# Patient Record
Sex: Female | Born: 1960 | Race: White | Hispanic: No | Smoking: Former smoker
Health system: Southern US, Community
[De-identification: ages and names within clinical notes are randomized; demographics above are authoritative.]

## PROBLEM LIST (undated history)

## (undated) DIAGNOSIS — R81 Glycosuria: Secondary | ICD-10-CM

## (undated) DIAGNOSIS — IMO0002 Reserved for concepts with insufficient information to code with codable children: Secondary | ICD-10-CM

## (undated) DIAGNOSIS — R569 Unspecified convulsions: Secondary | ICD-10-CM

## (undated) DIAGNOSIS — H269 Unspecified cataract: Secondary | ICD-10-CM

## (undated) DIAGNOSIS — D329 Benign neoplasm of meninges, unspecified: Secondary | ICD-10-CM

## (undated) DIAGNOSIS — G43909 Migraine, unspecified, not intractable, without status migrainosus: Secondary | ICD-10-CM

## (undated) DIAGNOSIS — K635 Polyp of colon: Secondary | ICD-10-CM

## (undated) DIAGNOSIS — I341 Nonrheumatic mitral (valve) prolapse: Secondary | ICD-10-CM

## (undated) DIAGNOSIS — K222 Esophageal obstruction: Secondary | ICD-10-CM

## (undated) DIAGNOSIS — B1920 Unspecified viral hepatitis C without hepatic coma: Secondary | ICD-10-CM

## (undated) DIAGNOSIS — E785 Hyperlipidemia, unspecified: Secondary | ICD-10-CM

## (undated) DIAGNOSIS — K219 Gastro-esophageal reflux disease without esophagitis: Secondary | ICD-10-CM

## (undated) DIAGNOSIS — K759 Inflammatory liver disease, unspecified: Secondary | ICD-10-CM

## (undated) DIAGNOSIS — T7840XA Allergy, unspecified, initial encounter: Secondary | ICD-10-CM

## (undated) HISTORY — DX: Gastro-esophageal reflux disease without esophagitis: K21.9

## (undated) HISTORY — DX: Inflammatory liver disease, unspecified: K75.9

## (undated) HISTORY — DX: Nonrheumatic mitral (valve) prolapse: I34.1

## (undated) HISTORY — DX: Unspecified viral hepatitis C without hepatic coma: B19.20

## (undated) HISTORY — DX: Unspecified convulsions: R56.9

## (undated) HISTORY — DX: Esophageal obstruction: K22.2

## (undated) HISTORY — PX: CRANIOTOMY: SHX93

## (undated) HISTORY — PX: BUNIONECTOMY: SHX129

## (undated) HISTORY — PX: APPENDECTOMY: SHX54

## (undated) HISTORY — DX: Allergy, unspecified, initial encounter: T78.40XA

## (undated) HISTORY — DX: Hyperlipidemia, unspecified: E78.5

## (undated) HISTORY — DX: Migraine, unspecified, not intractable, without status migrainosus: G43.909

## (undated) HISTORY — DX: Unspecified cataract: H26.9

## (undated) HISTORY — DX: Benign neoplasm of meninges, unspecified: D32.9

## (undated) HISTORY — PX: WISDOM TOOTH EXTRACTION: SHX21

## (undated) HISTORY — PX: TUBAL LIGATION: SHX77

## (undated) HISTORY — DX: Polyp of colon: K63.5

## (undated) HISTORY — DX: Reserved for concepts with insufficient information to code with codable children: IMO0002

## (undated) HISTORY — DX: Glycosuria: R81

---

## 1980-05-22 HISTORY — PX: CYSTECTOMY: SUR359

## 1996-05-22 DIAGNOSIS — D496 Neoplasm of unspecified behavior of brain: Secondary | ICD-10-CM | POA: Insufficient documentation

## 1996-05-22 HISTORY — PX: OTHER SURGICAL HISTORY: SHX169

## 1998-04-23 ENCOUNTER — Ambulatory Visit (HOSPITAL_COMMUNITY): Admission: RE | Admit: 1998-04-23 | Discharge: 1998-04-23 | Payer: Self-pay | Admitting: Neurosurgery

## 1998-04-23 ENCOUNTER — Encounter: Payer: Self-pay | Admitting: Neurosurgery

## 1998-05-22 HISTORY — PX: OTHER SURGICAL HISTORY: SHX169

## 1998-06-21 ENCOUNTER — Other Ambulatory Visit: Admission: RE | Admit: 1998-06-21 | Discharge: 1998-06-21 | Payer: Self-pay | Admitting: Obstetrics and Gynecology

## 1998-06-25 ENCOUNTER — Other Ambulatory Visit: Admission: RE | Admit: 1998-06-25 | Discharge: 1998-06-25 | Payer: Self-pay | Admitting: Obstetrics and Gynecology

## 1998-10-21 ENCOUNTER — Other Ambulatory Visit: Admission: RE | Admit: 1998-10-21 | Discharge: 1998-10-21 | Payer: Self-pay | Admitting: Obstetrics and Gynecology

## 1999-03-17 ENCOUNTER — Encounter: Payer: Self-pay | Admitting: Obstetrics and Gynecology

## 1999-03-18 ENCOUNTER — Encounter (INDEPENDENT_AMBULATORY_CARE_PROVIDER_SITE_OTHER): Payer: Self-pay | Admitting: Specialist

## 1999-03-18 ENCOUNTER — Ambulatory Visit (HOSPITAL_COMMUNITY): Admission: RE | Admit: 1999-03-18 | Discharge: 1999-03-18 | Payer: Self-pay | Admitting: Obstetrics and Gynecology

## 1999-04-29 ENCOUNTER — Other Ambulatory Visit: Admission: RE | Admit: 1999-04-29 | Discharge: 1999-04-29 | Payer: Self-pay | Admitting: Obstetrics and Gynecology

## 1999-07-29 ENCOUNTER — Other Ambulatory Visit: Admission: RE | Admit: 1999-07-29 | Discharge: 1999-07-29 | Payer: Self-pay | Admitting: Obstetrics and Gynecology

## 1999-09-01 ENCOUNTER — Emergency Department (HOSPITAL_COMMUNITY): Admission: EM | Admit: 1999-09-01 | Discharge: 1999-09-02 | Payer: Self-pay | Admitting: Emergency Medicine

## 1999-10-27 ENCOUNTER — Other Ambulatory Visit: Admission: RE | Admit: 1999-10-27 | Discharge: 1999-10-27 | Payer: Self-pay | Admitting: Obstetrics and Gynecology

## 2000-02-09 ENCOUNTER — Other Ambulatory Visit: Admission: RE | Admit: 2000-02-09 | Discharge: 2000-02-09 | Payer: Self-pay | Admitting: Obstetrics and Gynecology

## 2000-04-08 ENCOUNTER — Encounter: Payer: Self-pay | Admitting: Neurosurgery

## 2000-04-08 ENCOUNTER — Ambulatory Visit (HOSPITAL_COMMUNITY): Admission: RE | Admit: 2000-04-08 | Discharge: 2000-04-08 | Payer: Self-pay | Admitting: Neurosurgery

## 2000-05-11 ENCOUNTER — Other Ambulatory Visit: Admission: RE | Admit: 2000-05-11 | Discharge: 2000-05-11 | Payer: Self-pay | Admitting: Obstetrics and Gynecology

## 2000-11-21 ENCOUNTER — Other Ambulatory Visit: Admission: RE | Admit: 2000-11-21 | Discharge: 2000-11-21 | Payer: Self-pay | Admitting: Obstetrics and Gynecology

## 2001-06-21 ENCOUNTER — Other Ambulatory Visit: Admission: RE | Admit: 2001-06-21 | Discharge: 2001-06-21 | Payer: Self-pay | Admitting: Obstetrics and Gynecology

## 2002-01-27 ENCOUNTER — Encounter (INDEPENDENT_AMBULATORY_CARE_PROVIDER_SITE_OTHER): Payer: Self-pay | Admitting: *Deleted

## 2003-02-05 ENCOUNTER — Emergency Department (HOSPITAL_COMMUNITY): Admission: EM | Admit: 2003-02-05 | Discharge: 2003-02-05 | Payer: Self-pay | Admitting: Emergency Medicine

## 2003-02-05 ENCOUNTER — Encounter: Payer: Self-pay | Admitting: Emergency Medicine

## 2003-05-23 HISTORY — PX: OTHER SURGICAL HISTORY: SHX169

## 2003-08-05 ENCOUNTER — Ambulatory Visit (HOSPITAL_BASED_OUTPATIENT_CLINIC_OR_DEPARTMENT_OTHER): Admission: RE | Admit: 2003-08-05 | Discharge: 2003-08-05 | Payer: Self-pay | Admitting: Orthopedic Surgery

## 2004-12-01 ENCOUNTER — Ambulatory Visit: Payer: Self-pay | Admitting: Gastroenterology

## 2004-12-15 ENCOUNTER — Ambulatory Visit: Payer: Self-pay | Admitting: Gastroenterology

## 2004-12-15 ENCOUNTER — Encounter: Payer: Self-pay | Admitting: Internal Medicine

## 2004-12-15 ENCOUNTER — Encounter (INDEPENDENT_AMBULATORY_CARE_PROVIDER_SITE_OTHER): Payer: Self-pay | Admitting: Specialist

## 2005-03-06 ENCOUNTER — Ambulatory Visit: Payer: Self-pay | Admitting: Internal Medicine

## 2006-01-25 ENCOUNTER — Encounter: Admission: RE | Admit: 2006-01-25 | Discharge: 2006-01-25 | Payer: Self-pay | Admitting: Obstetrics and Gynecology

## 2006-09-18 ENCOUNTER — Ambulatory Visit: Payer: Self-pay | Admitting: Internal Medicine

## 2006-09-18 LAB — CONVERTED CEMR LAB
ALT: 17 units/L (ref 0–40)
Albumin: 3.6 g/dL (ref 3.5–5.2)
Alkaline Phosphatase: 81 units/L (ref 39–117)
BUN: 8 mg/dL (ref 6–23)
Basophils Absolute: 0.1 10*3/uL (ref 0.0–0.1)
Basophils Relative: 0.8 % (ref 0.0–1.0)
CO2: 31 meq/L (ref 19–32)
Calcium: 9 mg/dL (ref 8.4–10.5)
Chloride: 105 meq/L (ref 96–112)
Cholesterol: 222 mg/dL (ref 0–200)
Creatinine, Ser: 0.7 mg/dL (ref 0.4–1.2)
Direct LDL: 148.7 mg/dL
Free T4: 0.6 ng/dL (ref 0.6–1.6)
HDL: 51.9 mg/dL (ref >39.0)
Hemoglobin: 13.3 g/dL (ref 12.0–15.0)
MCHC: 34.5 g/dL (ref 30.0–36.0)
Monocytes Relative: 6.2 % (ref 3.0–11.0)
Platelets: 302 10*3/uL (ref 150–400)
RBC: 4.43 M/uL (ref 3.87–5.11)
RDW: 12.8 % (ref 11.5–14.6)
Total Bilirubin: 0.6 mg/dL (ref 0.3–1.2)
Total CHOL/HDL Ratio: 4.3
Total Protein: 6.9 g/dL (ref 6.0–8.3)
Triglycerides: 125 mg/dL (ref 0–149)
VLDL: 25 mg/dL (ref 0–40)

## 2006-12-05 ENCOUNTER — Ambulatory Visit: Payer: Self-pay | Admitting: Internal Medicine

## 2006-12-05 LAB — CONVERTED CEMR LAB: Phenobarbital: 13.7 ug/mL — ABNORMAL LOW (ref 15.0–40.0)

## 2006-12-10 ENCOUNTER — Telehealth: Payer: Self-pay | Admitting: Internal Medicine

## 2006-12-12 ENCOUNTER — Telehealth: Payer: Self-pay | Admitting: Internal Medicine

## 2006-12-14 ENCOUNTER — Ambulatory Visit: Payer: Self-pay | Admitting: Cardiology

## 2006-12-19 ENCOUNTER — Encounter: Payer: Self-pay | Admitting: Internal Medicine

## 2006-12-19 ENCOUNTER — Ambulatory Visit: Payer: Self-pay

## 2007-01-28 ENCOUNTER — Encounter: Admission: RE | Admit: 2007-01-28 | Discharge: 2007-01-28 | Payer: Self-pay | Admitting: Obstetrics and Gynecology

## 2007-02-27 DIAGNOSIS — Z86010 Personal history of colonic polyps: Secondary | ICD-10-CM | POA: Insufficient documentation

## 2007-10-17 ENCOUNTER — Telehealth: Payer: Self-pay | Admitting: *Deleted

## 2007-12-24 ENCOUNTER — Ambulatory Visit: Payer: Self-pay | Admitting: Internal Medicine

## 2007-12-24 DIAGNOSIS — R81 Glycosuria: Secondary | ICD-10-CM | POA: Insufficient documentation

## 2007-12-24 DIAGNOSIS — Z6841 Body Mass Index (BMI) 40.0 and over, adult: Secondary | ICD-10-CM | POA: Insufficient documentation

## 2007-12-24 DIAGNOSIS — E559 Vitamin D deficiency, unspecified: Secondary | ICD-10-CM | POA: Insufficient documentation

## 2007-12-25 LAB — CONVERTED CEMR LAB
Alkaline Phosphatase: 87 units/L (ref 39–117)
Basophils Absolute: 0.1 10*3/uL (ref 0.0–0.1)
Bilirubin, Direct: 0.1 mg/dL (ref 0.0–0.3)
Calcium: 8.6 mg/dL (ref 8.4–10.5)
Cholesterol: 225 mg/dL (ref 0–200)
Direct LDL: 156.8 mg/dL
Free T4: 0.8 ng/dL (ref 0.6–1.6)
GFR calc Af Amer: 98 mL/min
GFR calc non Af Amer: 81 mL/min
HCT: 39.8 % (ref 36.0–46.0)
Hemoglobin: 13.6 g/dL (ref 12.0–15.0)
MCHC: 34.1 g/dL (ref 30.0–36.0)
Monocytes Absolute: 0.5 10*3/uL (ref 0.1–1.0)
Monocytes Relative: 7.2 % (ref 3.0–12.0)
Neutro Abs: 4.9 10*3/uL (ref 1.4–7.7)
Platelets: 327 10*3/uL (ref 150–400)
Potassium: 4.2 meq/L (ref 3.5–5.1)
RDW: 12.6 % (ref 11.5–14.6)
Sodium: 138 meq/L (ref 135–145)
TSH: 1.87 microintl units/mL (ref 0.35–5.50)
Total Bilirubin: 0.7 mg/dL (ref 0.3–1.2)
Total CHOL/HDL Ratio: 4.8
Triglycerides: 120 mg/dL (ref 0–149)
VLDL: 24 mg/dL (ref 0–40)

## 2007-12-27 LAB — CONVERTED CEMR LAB: Vit D, 1,25-Dihydroxy: 15 — ABNORMAL LOW (ref 30–89)

## 2008-01-29 ENCOUNTER — Encounter: Admission: RE | Admit: 2008-01-29 | Discharge: 2008-01-29 | Payer: Self-pay | Admitting: Obstetrics and Gynecology

## 2008-05-19 ENCOUNTER — Encounter: Payer: Self-pay | Admitting: Internal Medicine

## 2009-02-01 ENCOUNTER — Ambulatory Visit: Payer: Self-pay | Admitting: Internal Medicine

## 2009-02-01 DIAGNOSIS — J019 Acute sinusitis, unspecified: Secondary | ICD-10-CM | POA: Insufficient documentation

## 2009-02-02 ENCOUNTER — Telehealth (INDEPENDENT_AMBULATORY_CARE_PROVIDER_SITE_OTHER): Payer: Self-pay | Admitting: *Deleted

## 2009-02-03 ENCOUNTER — Telehealth: Payer: Self-pay | Admitting: Internal Medicine

## 2009-02-04 ENCOUNTER — Ambulatory Visit: Payer: Self-pay | Admitting: Family Medicine

## 2009-02-10 ENCOUNTER — Encounter: Admission: RE | Admit: 2009-02-10 | Discharge: 2009-02-10 | Payer: Self-pay | Admitting: Obstetrics and Gynecology

## 2009-03-01 ENCOUNTER — Telehealth: Payer: Self-pay | Admitting: *Deleted

## 2009-03-02 ENCOUNTER — Ambulatory Visit: Payer: Self-pay | Admitting: Internal Medicine

## 2009-03-02 DIAGNOSIS — R5381 Other malaise: Secondary | ICD-10-CM | POA: Insufficient documentation

## 2009-03-02 DIAGNOSIS — R059 Cough, unspecified: Secondary | ICD-10-CM | POA: Insufficient documentation

## 2009-03-02 DIAGNOSIS — R05 Cough: Secondary | ICD-10-CM

## 2009-03-02 DIAGNOSIS — R5383 Other fatigue: Secondary | ICD-10-CM

## 2009-03-02 DIAGNOSIS — R079 Chest pain, unspecified: Secondary | ICD-10-CM | POA: Insufficient documentation

## 2009-03-02 LAB — CONVERTED CEMR LAB
Basophils Relative: 0 % (ref 0.0–3.0)
Bilirubin, Direct: 0 mg/dL (ref 0.0–0.3)
CMV IgM: <8 (ref <30.0)
CO2: 29 meq/L (ref 19–32)
Calcium: 9.2 mg/dL (ref 8.4–10.5)
Creatinine, Ser: 0.8 mg/dL (ref 0.4–1.2)
Eosinophils Absolute: 0.3 10*3/uL (ref 0.0–0.7)
GFR calc non Af Amer: 80.48 mL/min (ref >60)
HCT: 40.8 % (ref 36.0–46.0)
HCV Ab: REACTIVE — AB
Hemoglobin: 13.8 g/dL (ref 12.0–15.0)
Hepatitis B Surface Ag: NEGATIVE
Heterophile Ab Screen: NEGATIVE
Lymphocytes Relative: 19.5 % (ref 12.0–46.0)
Lymphs Abs: 2 10*3/uL (ref 0.7–4.0)
MCHC: 33.9 g/dL (ref 30.0–36.0)
Neutro Abs: 8 10*3/uL — ABNORMAL HIGH (ref 1.4–7.7)
RBC: 4.6 M/uL (ref 3.87–5.11)
Sodium: 140 meq/L (ref 135–145)
TSH: 1.47 microintl units/mL (ref 0.35–5.50)
Total Bilirubin: 0.6 mg/dL (ref 0.3–1.2)
Total Protein: 7.3 g/dL (ref 6.0–8.3)

## 2009-03-04 ENCOUNTER — Encounter: Payer: Self-pay | Admitting: Internal Medicine

## 2009-03-05 ENCOUNTER — Telehealth: Payer: Self-pay | Admitting: Internal Medicine

## 2009-03-09 ENCOUNTER — Telehealth: Payer: Self-pay | Admitting: Internal Medicine

## 2009-03-11 ENCOUNTER — Ambulatory Visit: Payer: Self-pay | Admitting: Internal Medicine

## 2009-03-19 ENCOUNTER — Telehealth: Payer: Self-pay | Admitting: *Deleted

## 2009-05-04 ENCOUNTER — Encounter: Payer: Self-pay | Admitting: Internal Medicine

## 2009-05-22 HISTORY — PX: COLONOSCOPY W/ BIOPSIES: SHX1374

## 2009-11-08 ENCOUNTER — Encounter (INDEPENDENT_AMBULATORY_CARE_PROVIDER_SITE_OTHER): Payer: Self-pay | Admitting: *Deleted

## 2010-01-25 ENCOUNTER — Encounter (INDEPENDENT_AMBULATORY_CARE_PROVIDER_SITE_OTHER): Payer: Self-pay | Admitting: *Deleted

## 2010-01-26 ENCOUNTER — Ambulatory Visit: Payer: Self-pay | Admitting: Gastroenterology

## 2010-02-04 ENCOUNTER — Ambulatory Visit: Payer: Self-pay | Admitting: Gastroenterology

## 2010-02-04 LAB — HM COLONOSCOPY

## 2010-02-08 ENCOUNTER — Encounter: Payer: Self-pay | Admitting: Gastroenterology

## 2010-02-14 ENCOUNTER — Encounter: Admission: RE | Admit: 2010-02-14 | Discharge: 2010-02-14 | Payer: Self-pay | Admitting: Obstetrics and Gynecology

## 2010-05-17 ENCOUNTER — Encounter: Payer: Self-pay | Admitting: Internal Medicine

## 2010-05-22 DIAGNOSIS — K222 Esophageal obstruction: Secondary | ICD-10-CM

## 2010-05-22 DIAGNOSIS — IMO0002 Reserved for concepts with insufficient information to code with codable children: Secondary | ICD-10-CM

## 2010-05-22 HISTORY — PX: ESOPHAGOGASTRODUODENOSCOPY: SHX1529

## 2010-05-22 HISTORY — DX: Esophageal obstruction: K22.2

## 2010-05-22 HISTORY — DX: Reserved for concepts with insufficient information to code with codable children: IMO0002

## 2010-06-12 ENCOUNTER — Encounter: Payer: Self-pay | Admitting: Obstetrics and Gynecology

## 2010-06-21 NOTE — Letter (Signed)
Summary: [facility]  [facility]   Imported By: Maryln Gottron 05/25/2009 14:45:31  _____________________________________________________________________  External Attachment:    Type:   Image     Comment:   External Document

## 2010-06-21 NOTE — Letter (Signed)
Summary: Patient Notice- Polyp Results  [facility]  [address] [place], [place] [address]   Phone: [phone]  Fax: [phone]        February 08, 2010 MRN: [id number]    Tricia [name] [address] [place], [place]  [address]    Dear [name],  I am pleased to inform you that the colon polyp(s) removed during your recent colonoscopy was (were) found to be benign (no cancer detected) upon pathologic examination.  I recommend you have a repeat colonoscopy examination in 5_ years to look for recurrent polyps, as having colon polyps increases your risk for having recurrent polyps or even colon cancer in the future.  Should you develop new or worsening symptoms of abdominal pain, bowel habit changes or bleeding from the rectum or bowels, please schedule an evaluation with either your primary care physician or with me.  Additional information/recommendations:  __ No further action with gastroenterology is needed at this time. Please      follow-up with your primary care physician for your other healthcare      needs.  __ Please call [phone] to schedule a return visit to review your      situation.  __ Please keep your follow-up visit as already scheduled.  _x_ Continue treatment plan as outlined the day of your exam.  Please call us if you are having persistent problems or have questions about your condition that have not been fully answered at this time.  Sincerely,  Louis Meckel MD  This letter has been electronically signed by your physician.  Appended Document: Patient Notice- Polyp Results letter mailed.

## 2010-06-21 NOTE — Letter (Signed)
Summary: [facility] Instructions  [facility]  [address] [place], [place] [address]   Phone: [phone]  Fax: [phone]       Tricia [name]    [birth year]    MRN: [id number]        Procedure Day Dorna Bloom:  Farrell Ours  02/04/10     Arrival Time: 8:00am     Procedure Time: 9:00am     Location of Procedure:                    [facility] (4th Floor)                        PREPARATION FOR COLONOSCOPY WITH MOVIPREP   Starting 5 days prior to your procedure  SUNDAY 09/11  do not eat nuts, seeds, popcorn, corn, beans, peas,  salads, or any raw vegetables.  Do not take any fiber supplements (e.g. Metamucil, Citrucel, and Benefiber).  THE DAY BEFORE YOUR PROCEDURE         DATE:  THURSDAY  09/15  1.  Drink clear liquids the entire day-NO SOLID FOOD  2.  Do not drink anything colored red or purple.  Avoid juices with pulp.  No orange juice.  3.  Drink at least 64 oz. (8 glasses) of fluid/clear liquids during the day to prevent dehydration and help the prep work efficiently.  CLEAR LIQUIDS INCLUDE: Water Jello Ice Popsicles Tea (sugar ok, no milk/cream) Powdered fruit flavored drinks Coffee (sugar ok, no milk/cream) Gatorade Juice: apple, white grape, white cranberry  Lemonade Clear bullion, consomm, broth Carbonated beverages (any kind) Strained chicken noodle soup Hard Candy                             4.  In the morning, mix first dose of MoviPrep solution:    Empty 1 Pouch A and 1 Pouch B into the disposable container    Add lukewarm drinking water to the top line of the container. Mix to dissolve    Refrigerate (mixed solution should be used within 24 hrs)  5.  Begin drinking the prep at 5:00 p.m. The MoviPrep container is divided by 4 marks.   Every 15 minutes drink the solution down to the next mark (approximately 8 oz) until the full liter is complete.   6.  Follow completed prep with 16 oz of clear liquid of your choice  (Nothing red or purple).  Continue to drink clear liquids until bedtime.  7.  Before going to bed, mix second dose of MoviPrep solution:    Empty 1 Pouch A and 1 Pouch B into the disposable container    Add lukewarm drinking water to the top line of the container. Mix to dissolve    Refrigerate  THE DAY OF YOUR PROCEDURE      DATE: FRIDAY  09/16  Beginning at  4:00 a.m. (5 hours before procedure):         1. Every 15 minutes, drink the solution down to the next mark (approx 8 oz) until the full liter is complete.  2. Follow completed prep with 16 oz. of clear liquid of your choice.    3. You may drink clear liquids until 7:00am  (2 HOURS BEFORE PROCEDURE).   MEDICATION INSTRUCTIONS  Unless otherwise instructed, you should take regular prescription medications with a small sip of water   as early as  possible the morning of your procedure.        OTHER INSTRUCTIONS  You will need a responsible adult at least 52 years of age to accompany you and drive you home.   This person must remain in the waiting room during your procedure.  Wear loose fitting clothing that is easily removed.  Leave jewelry and other valuables at home.  However, you may wish to bring a book to read or  an iPod/MP3 player to listen to music as you wait for your procedure to start.  Remove all body piercing jewelry and leave at home.  Total time from sign-in until discharge is approximately 2-3 hours.  You should go home directly after your procedure and rest.  You can resume normal activities the  day after your procedure.  The day of your procedure you should not:   Drive   Make legal decisions   Operate machinery   Drink alcohol   Return to work  You will receive specific instructions about eating, activities and medications before you leave.    The above instructions have been reviewed and explained to me by   Ezra Sites RN  January 26, 2010 4:49 PM     I fully understand and  can verbalize these instructions _____________________________ Date _________

## 2010-06-21 NOTE — Miscellaneous (Signed)
Summary: [facility]  Clinical Lists Changes  Medications: Added new medication of MOVIPREP 100 GM  SOLR (PEG-KCL-NACL-NASULF-NA ASC-C) As per prep instructions. - Signed Rx of MOVIPREP 100 GM  SOLR (PEG-KCL-NACL-NASULF-NA ASC-C) As per prep instructions.;  #1 x 0;  Signed;  Entered by: Ezra Sites RN;  Authorized by: Louis Meckel MD;  Method used: [facility] [phone]*, [address], [place], [place]  [address], Ph: [phone] or [phone], Fax: [phone] Observations: Added new observation of ALLERGY REV: Done (01/26/2010 16:24)    Prescriptions: MOVIPREP 100 GM  SOLR (PEG-KCL-NACL-NASULF-NA ASC-C) As per prep instructions.  #1 x 0   Entered by:   Ezra Sites RN   Authorized by:   Louis Meckel MD   Signed by:   Ezra Sites RN on 01/26/2010   Method used:   [facility]ronically to        [facility] [phone]* (retail)       [address]       [place], [place]  [address]       Ph: [phone] or [phone]       Fax: [phone]   RxID:   [id number]

## 2010-06-21 NOTE — Procedures (Signed)
Summary: Colonoscopy  Patient: Tricia [name] Note: All result statuses are Final unless otherwise noted.  Tests: (1) Colonoscopy (COL)   COL Colonoscopy           DONE     [facility]     [address]     [place], [place]  [address]           COLONOSCOPY PROCEDURE REPORT           PATIENT:  Tricia [name], Tricia [name]  MR#:  [id number]     BIRTHDATE:  [birth year], 49 yrs. old  GENDER:  female           ENDOSCOPIST:  Barbette Hair. Arlyce Dice, MD     Referred by:           PROCEDURE DATE:  02/04/2010     PROCEDURE:  Colonoscopy with snare polypectomy, Colon with cold     biopsy polypectomy     ASA CLASS:  Class II     INDICATIONS:  1) screening  2) family history of colon cancer  3)     history of pre-cancerous (adenomatous) colon polyps Father, uncles                 MEDICATIONS:   Fentanyl 75 mcg IV, Versed 8 mg IV           DESCRIPTION OF PROCEDURE:   After the risks benefits and     alternatives of the procedure were thoroughly explained, informed     consent was obtained.  Digital rectal exam was performed and     revealed no abnormalities.   The LB CF-[id number] endoscope     was introduced through the anus and advanced to the cecum, which     was identified by both the appendix and ileocecal valve, without     limitations.  The quality of the prep was excellent, using     MoviPrep.  The instrument was then slowly withdrawn as the colon     was fully examined.     <<PROCEDUREIMAGES>>           FINDINGS:  A diminutive polyp was found in the cecum. It was 1 mm     in size. The polyp was removed using cold biopsy forceps (see     image1).  A sessile polyp was found in the ascending colon. It was     2 mm in size. Polyp was snared without cautery. Retrieval was     successful (see image4). snare polyp  This was otherwise a normal     examination of the colon (see image2, image3, image6, image9,     image10, image11, image13, image16, and image17).   Retroflexed     views in  the rectum revealed no abnormalities.    The time to     cecum =  2.75  minutes. The scope was then withdrawn (time =  8.25     min) from the patient and the procedure completed.           COMPLICATIONS:  None           ENDOSCOPIC IMPRESSION:     1) 1 mm diminutive polyp in the cecum     2) 2 mm sessile polyp in the ascending colon     3) Otherwise normal examination     RECOMMENDATIONS:     1) Given your significant family history of colon cancer, you     should have a repeat colonoscopy  in 5 years           REPEAT EXAM:  In 5 year(s) for Colonoscopy.           ______________________________     Barbette Hair. Arlyce Dice, MD           CC: Madelin Headings, MD           n.     Rosalie DoctorBarbette Hair. Kaplan at 02/04/2010 09:58 AM           Page 2 of 3   [place], [place] [employer], [phone]  Note: An exclamation mark (!) indicates a result that was not dispersed into the flowsheet. Document Creation Date: 02/04/2010 9:58 AM _______________________________________________________________________  (1) Order result status: Final Collection or observation date-time: 02/04/2010 09:52 Requested date-time:  Receipt date-time:  Reported date-time:  Referring Physician:   Ordering Physician: Melvia Heaps [phone]) Specimen Source:  Source: Launa Grill Order Number: [id number] Lab site:   Appended Document: Colonoscopy     Procedures Next Due Date:    Colonoscopy: 02/2015

## 2010-06-21 NOTE — Letter (Signed)
Summary: Previsit letter  [facility]  [address] [place], [place] [address]   Phone: [phone]  Fax: [phone]       11/08/2009 MRN: [id number]  [facility] [address] [place], [place]  [address]  Dear Ms. Cruey,  Welcome to the Gastroenterology Division at [facility].    You are scheduled to see a nurse for your pre-procedure visit on 01-26-10 at 4:30p.m. on the 3rd floor at [facility], [address]  We ask that you try to arrive at our office 15 minutes prior to your appointment time to allow for check-in.  Your nurse visit will consist of discussing your medical and surgical history, your immediate family medical history, and your medications.    Please bring a complete list of all your medications or, if you prefer, bring the medication bottles and we will list them.  We will need to be aware of both prescribed and over the counter drugs.  We will need to know exact dosage information as well.  If you are on blood thinners (Coumadin, Plavix, Aggrenox, Ticlid, etc.) please call our office today/prior to your appointment, as we need to consult with your physician about holding your medication.   Please be prepared to read and sign documents such as consent forms, a financial agreement, and acknowledgement forms.  If necessary, and with your consent, a friend or relative is welcome to sit-in on the nurse visit with you.  Please bring your insurance card so that we may make a copy of it.  If your insurance requires a referral to see a specialist, please bring your referral form from your primary care physician.  No co-pay is required for this nurse visit.     If you cannot keep your appointment, please call [phone] to cancel or reschedule prior to your appointment date.  This allows [place] the opportunity to schedule an appointment for another patient in need of care.    Thank you for choosing [facility] for your medical  needs.  We appreciate the opportunity to care for you.  Please visit [place] at our website  to learn more about our practice.                     Sincerely.                                                                                                                   The Gastroenterology Division

## 2010-06-23 NOTE — Letter (Signed)
Summary: [facility]  [facility]   Imported By: Maryln Gottron 06/01/2010 14:24:43  _____________________________________________________________________  External Attachment:    Type:   Image     Comment:   External Document

## 2010-07-11 LAB — LIPID PANEL
Cholesterol: 243 mg/dL — AB (ref 0–200)
HDL: 54 mg/dL (ref 35–70)

## 2010-07-14 ENCOUNTER — Ambulatory Visit: Payer: Self-pay | Admitting: Internal Medicine

## 2010-09-21 ENCOUNTER — Encounter: Payer: Self-pay | Admitting: Internal Medicine

## 2010-10-07 NOTE — Op Note (Signed)
NAME:  Tricia [name], Tricia [name]                        ACCOUNT NO.:  [id number]   MEDICAL RECORD NO.:  [id number]                   PATIENT TYPE:  AMB   LOCATION:  [facility]                                  FACILITY:  [facility]   PHYSICIAN:  Feliberto Gottron. Turner Daniels, M.D.                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   OPERATION PERFORMED:  Left shoulder arthroscopic anterior inferior  acromioplasty, debridement of labral tear superior complex nonrepairable.  The rim of the labrum was actually intact.   SURGEON:  Feliberto Gottron. Turner Daniels, M.D.   ASSISTANT:  None.   ANESTHESIA:  Interscalene block plus general LMA.   ESTIMATED BLOOD LOSS:  Minimal.   FLUIDS REPLACED:  800 mL of crystalloid.   DRAINS:  None.   TOURNIQUET TIME:  None.   INDICATIONS FOR PROCEDURE:  The patient is a 50 year old woman who sustained  a shoulder dislocation a few months ago, has failed conservative treatment,  has positive impingement signs as well as MRI scan showing superior labral  tear that appears to be relatively complex.  I believe she got good  temporary relief from a cortisone injection about 85%, desires elective  arthroscopic evaluation and treatment of her left shoulder.   DESCRIPTION OF PROCEDURE:  The patient was identified by arm band and taken  to the operating room at [facility] where appropriate  anesthetic monitors are attached and left interscalene block anesthesia  induced with the patient in the supine position.  This was followed by  general LMA anesthesia.  The patient was placed in the beach chair position.  The left upper extremity was prepped and draped in the usual sterile fashion  from the wrist to the hemithorax.  The skin  along the anterolateral and  posterior aspects of the acromion process was infiltrated with 2 to 3 mL of  0.25% Marcaine with epinephrine solution and using a #11 blade, standard  portals were then made 1.5 cm anterior to the acromioclavicular joint,  lateral to the junction of the middle and posterior thirds of the acromion  and posterior to the posterolateral corner of the acromion process.  The  inflow was placed anteriorly, the arthroscope laterally and a 4.2 Great  White sucker shaver posteriorly allowing subacromial bursectomy and  evaluation of the subacromial spur.  Small bleeders were identified and  cauterized with the underwater electrocautery and then the subacromial spur  was removed with a 4.5 hooded Vortex bur creating a type 1 acromial arch.  A  little big of fraying of the supraspinatus insertion was noted at its  insertion on the greater tuberosity, right below the spur and this was  lightly debrided involving 85% of the insertion.  It was definitely not full  thickness.  The arthroscope was then repositioned into the glenohumeral  joint using the posterior portal where we visualized the labrum and the  biceps anchor.  The anchor was intact, the labrum had some complex tearing  of the inner periphery which was debrided back to stable margin with a 3.5  Gator sucker shaver.  Photographs clearly showed fragments of the labrum  interposed between the humeral head and the glenoid.  The articular  cartilage of the glenohumeral joint was grossly intact.  The supraspinatus  insertion was intact as was the subscap and infraspinatus insertions.  Photographic documentation was made of these as well as the biceps as it  traversed the joint.  The shoulder was then irrigated out with normal saline  solution via the arthroscope.  The instruments were removed and a dressing  of Xeroform, 4 x 4 dressing sponges, Webril and paper tape applied followed  by a simple sling.  The patient was  then laid supine, awakened and taken to  the recovery room without difficulty.                                               Feliberto Gottron. Turner Daniels, M.D.    Ovid Curd  D:  08/05/2003  T:  08/06/2003  Job:  [id number]

## 2010-12-01 ENCOUNTER — Other Ambulatory Visit: Payer: Self-pay | Admitting: Obstetrics and Gynecology

## 2010-12-01 DIAGNOSIS — Z1231 Encounter for screening mammogram for malignant neoplasm of breast: Secondary | ICD-10-CM

## 2010-12-08 ENCOUNTER — Telehealth: Payer: Self-pay | Admitting: *Deleted

## 2010-12-08 NOTE — Telephone Encounter (Signed)
Pt unable to keep cpx appt scheduled for tomorrow.  Pt has been r/s to 12/3.  However requesting a sooner appointment .  Pt states she has labs and cpx the same day and needs and early morning appt.  Please advise if and where pt can be worked in prior to 12/3.

## 2010-12-08 NOTE — Telephone Encounter (Signed)
Can do 9/19 at 10:15am. If she can come in for her labs prior to this appointment that would be great. Otherwise if pt wants to fast she can have water but nothing else or we can do labs another day so she doesn't have to fast as long.

## 2010-12-09 ENCOUNTER — Ambulatory Visit: Payer: Self-pay | Admitting: Internal Medicine

## 2010-12-09 NOTE — Telephone Encounter (Signed)
LMOMTCB

## 2011-02-08 ENCOUNTER — Ambulatory Visit (INDEPENDENT_AMBULATORY_CARE_PROVIDER_SITE_OTHER): Payer: Managed Care, Other (non HMO) | Admitting: Internal Medicine

## 2011-02-08 ENCOUNTER — Encounter: Payer: Self-pay | Admitting: Internal Medicine

## 2011-02-08 VITALS — BP 110/70 | HR 60 | Ht 65.5 in | Wt 215.0 lb

## 2011-02-08 DIAGNOSIS — I059 Rheumatic mitral valve disease, unspecified: Secondary | ICD-10-CM

## 2011-02-08 DIAGNOSIS — Z86010 Personal history of colonic polyps: Secondary | ICD-10-CM

## 2011-02-08 DIAGNOSIS — R079 Chest pain, unspecified: Secondary | ICD-10-CM

## 2011-02-08 DIAGNOSIS — B1920 Unspecified viral hepatitis C without hepatic coma: Secondary | ICD-10-CM | POA: Insufficient documentation

## 2011-02-08 DIAGNOSIS — G43909 Migraine, unspecified, not intractable, without status migrainosus: Secondary | ICD-10-CM | POA: Insufficient documentation

## 2011-02-08 DIAGNOSIS — K759 Inflammatory liver disease, unspecified: Secondary | ICD-10-CM | POA: Insufficient documentation

## 2011-02-08 DIAGNOSIS — D320 Benign neoplasm of cerebral meninges: Secondary | ICD-10-CM

## 2011-02-08 DIAGNOSIS — Z0000 Encounter for general adult medical examination without abnormal findings: Secondary | ICD-10-CM

## 2011-02-08 DIAGNOSIS — E669 Obesity, unspecified: Secondary | ICD-10-CM

## 2011-02-08 DIAGNOSIS — R81 Glycosuria: Secondary | ICD-10-CM | POA: Insufficient documentation

## 2011-02-08 DIAGNOSIS — Z23 Encounter for immunization: Secondary | ICD-10-CM

## 2011-02-08 DIAGNOSIS — I341 Nonrheumatic mitral (valve) prolapse: Secondary | ICD-10-CM | POA: Insufficient documentation

## 2011-02-08 DIAGNOSIS — D329 Benign neoplasm of meninges, unspecified: Secondary | ICD-10-CM | POA: Insufficient documentation

## 2011-02-08 LAB — BASIC METABOLIC PANEL
Calcium: 9.3 mg/dL (ref 8.4–10.5)
Creatinine, Ser: 0.8 mg/dL (ref 0.4–1.2)

## 2011-02-08 LAB — LIPID PANEL
Cholesterol: 214 mg/dL — ABNORMAL HIGH (ref 0–200)
HDL: 53.4 mg/dL (ref >39.00)
Total CHOL/HDL Ratio: 4
Triglycerides: 96 mg/dL (ref 0.0–149.0)
VLDL: 19.2 mg/dL (ref 0.0–40.0)

## 2011-02-08 LAB — CBC WITH DIFFERENTIAL/PLATELET
Eosinophils Relative: 2.4 % (ref 0.0–5.0)
MCV: 89.2 fl (ref 78.0–100.0)
Monocytes Absolute: 0.6 10*3/uL (ref 0.1–1.0)
Monocytes Relative: 6.7 % (ref 3.0–12.0)
Neutrophils Relative %: 63.6 % (ref 43.0–77.0)
Platelets: 282 10*3/uL (ref 150.0–400.0)
WBC: 8.5 10*3/uL (ref 4.5–10.5)

## 2011-02-08 LAB — HEPATIC FUNCTION PANEL: Total Bilirubin: 0.3 mg/dL (ref 0.3–1.2)

## 2011-02-08 LAB — LDL CHOLESTEROL, DIRECT: Direct LDL: 144.2 mg/dL

## 2011-02-08 LAB — TSH: TSH: 1.41 u[IU]/mL (ref 0.35–5.50)

## 2011-02-08 MED ORDER — SUMATRIPTAN SUCCINATE 100 MG PO TABS: 100.0000 mg | ORAL_TABLET | Freq: Once | ORAL | Status: DC | PRN

## 2011-02-08 NOTE — Progress Notes (Signed)
Subjective:    Patient ID: Tricia [name], female    DOB: 02-Oct-1960, 50 y.o.   MRN: [id number]  HPI Patient comes in today for preventive visit and follow-up of medical issues. Update of her history since her last visit. She feels that she is doing much better and related to her health and she has been losing weight with intervention of healthy lifestyle. He has more energy.  Lost weight 52 pounds in 6-8  Month.   Exercise cutting back eating better  Doing better.   See nudelman once a year.  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 New seizures.  Injuries.  NONE   She sees her gynecologist and has been put on Activella over the last year or less unsure if it is helping her menopausal symptoms. She gets some nocturnal sweats but has always had some sleep problems with a racing mind.  Now that her kids are out of the house she stays up late. Denies sleep apnea symptoms. Occasional snoring Chest pain that she states is been going off-and-on for years her to not progressive and fairly frequent. She describes it as a Burning twinge on the left chest  and thoracic back pain  Comes and goes.  [name] gerd.  [name] associated sx with this.  Lasts  10 - 15 mnutes and then goes away.  Not particularly related to exercise motion eating. There is [name] associated shortness of breath. She does worry about it has a family history of some heart disease and felt that she ate badly over the years. Understood they could be her mitral valve prolapse she apparently had a stress test in the 1990s. Review of Systems ROS:  GEN/ HEENTNo fever, significant weight changes sweats headaches vision problems hearing changes, CV/ PULM; [name]  shortness of breath cough, syncope,edema  change in exercise tolerance. GI /GU: [name] adominal pain, vomiting, change in bowel habits. [name] blood in the stool. [name] significant GU symptoms. SKIN/HEME: ,[name] acute skin rashes suspicious lesions or bleeding. [name] lymphadenopathy, nodules,  masses.   Past history family history social history reviewed in the electronic medical record.  NEURO/ PSYCH:  [name] neurologic signs such as weakness numbness [name] depression anxiety. IMM/ Allergy: [name] unusual infections.  Allergy .    Sleep interrupted at times   REST of 12 system review negative     Objective:   Physical Exam  Wt Readings from Last 3 Encounters:  02/08/11 215 lb (97.523 kg)  03/02/09 225 lb (102.059 kg)  02/01/09 225 lb (102.059 kg)    Physical Exam: Vital signs reviewed FAO:ZHYQ is a well-developed well-nourished alert cooperative  white female who appears her stated age in [name] acute distress.  HEENT: normocephalic  traumatic , Eyes: PERRL EOM's full, conjunctiva clear, Nares: paten,t [name] deformity discharge or tenderness., Ears: [name] deformity EAC's clear TMs with normal landmarks. Mouth: clear OP, [name] lesions, edema.  Moist mucous membranes. Dentition in adequate repair. NECK: supple without masses, thyromegaly or bruits. Breast: normal by inspection . [name] dimpling, discharge, masses, tenderness or discharge . LN: [name] cervical axillary inguinal adenopathy  CHEST/PULM:  Clear to auscultation and percussion breath sounds equal [name] wheeze , rales or rhonchi. [name] chest wall deformities or tenderness. CV: PMI is nondisplaced, S1 S2 [name] gallops, murmurs, rubs. Peripheral pulses are full without delay.[name] JVD .  ABDOMEN: Bowel sounds normal nontender  [name] guard or rebound, [name] hepato splenomegal [name] CVA tenderness.  [name] hernia. Extremtities:  [name] clubbing cyanosis or edema, [name]  acute joint swelling or redness [name] focal atrophy NEURO:  Oriented x3, cranial nerves 3-12 appear to be intact, [name] obvious focal weakness,gait within normal limits [name] abnormal reflexes or asymmetrical SKIN: [name] acute rashes normal turgor, color, [name] bruising or petechiae. Some [place] lipomas  Skin right abd and left arm slight pigment change  flat PSYCH: Oriented, good eye contact, [name] obvious depression anxiety, cognition  and judgment appear normal. Labs today  EKG NSR     Assessment & Plan:  Preventive Health Care Counseled regarding healthy nutrition, exercise, sleep, injury prevention, calcium vit d and healthy weight .  Recommended immunizations discussed and explained. Questions answered.  tdap and flu shot( never had one)  Disc low risk at all of [place]  Obesity  Agree with weigh tloss Problem with sleep [name] change  Doesn't think she has slepp apnea Left upper chest pain thorax pain  For a while comes nad goes and is worried about this but not assoc sx  Get cards opinion about need for futher work up  Or reassurance this is not cardiac in nature.  MHAs some aura but [name] stroke like sx   Requests go back on imitrex   Helps  Nose  Menopausal on med per GYNE hrt ? If helps Hx mvp Reported hep C serology pos but neg  Viral copies in record  From ceneticity  Will have to investigate  I do not believe she ever had HEP C from My memory  Will  Let her know.

## 2011-02-08 NOTE — Assessment & Plan Note (Signed)
No current symptoms

## 2011-02-08 NOTE — Patient Instructions (Signed)
Continue lifestyle intervention healthy eating and exercise . Ok to take imitrex as needed call if  Worsening. Will again look into the hep c serology I think you had a positive screening test but a negative  Viral test  But will review previous EHR.  Will notify you  of labs when available. Will get cardiology to see you about the burning chest pain  Doesn't seem like heart pain but you do have risk.

## 2011-02-10 ENCOUNTER — Ambulatory Visit (INDEPENDENT_AMBULATORY_CARE_PROVIDER_SITE_OTHER): Payer: Managed Care, Other (non HMO) | Admitting: Cardiology

## 2011-02-10 ENCOUNTER — Encounter: Payer: Self-pay | Admitting: Cardiology

## 2011-02-10 DIAGNOSIS — R079 Chest pain, unspecified: Secondary | ICD-10-CM

## 2011-02-10 DIAGNOSIS — E785 Hyperlipidemia, unspecified: Secondary | ICD-10-CM

## 2011-02-10 DIAGNOSIS — E669 Obesity, unspecified: Secondary | ICD-10-CM

## 2011-02-10 NOTE — Assessment & Plan Note (Signed)
I think this is atypical.  However, she does have risk factors.  I will bring the patient back for a POET (Plain Old Exercise Test). This will allow me to screen for obstructive coronary disease, risk stratify and very importantly provide a prescription for exercise.

## 2011-02-10 NOTE — Assessment & Plan Note (Signed)
I spent quite a bit of time discussing her lipids.  She will continue diet and she can have this repeated in 3 months.

## 2011-02-10 NOTE — Progress Notes (Signed)
HPI The patient presents for evaluation of chest discomfort. She has a history of mitral valve prolapse with echo apparently many years ago.  She has had chest discomfort for years. This has been sporadic. It comes on at rest. However, she had an episode couple of nights ago that woke her and was 7/10 in intensity. She describes substernal pain that radiates to the left and slightly in her back.  She has had some sweating and vague left arm pain.  She does not report PND/orthopnea or excessive DOE.  She has no palpitations, presyncope or syncope.  The pain dose improve with change in position.  I do note an LDL from 2 days ago that was 143.   Allergies  Allergen Reactions  . Codeine Sulfate     REACTION: unspecified  . Dilantin Rash    Current Outpatient Prescriptions  Medication Sig Dispense Refill  . PHENobarbital (LUMINAL) 97.2 MG tablet Take 97.2 mg by mouth at bedtime.       Marland Kitchen PRESCRIPTION MEDICATION Activella la 0.5mg /0.1mg        . SUMAtriptan (IMITREX) 100 MG tablet Take 1 tablet (100 mg total) by mouth once as needed for migraine (can repeat in 2 hours ).  9 tablet  4    Past Medical History  Diagnosis Date  . MVP (mitral valve prolapse)     echo 02 normal  . History of colonic polyps   . Jaundice due to hepatitis     hx when younger  . Glucosuria     with normal blood sugar  . Meningioma   . Migraine headache     some aura  period related  imitrex helps    Past Surgical History  Procedure Date  . Appendectomy   . Tubal ligation   . Frontal meningioma resected 1998  . Cin d&c conization 2000  . Bunionectomy   . Left shoulder dislocation surgery 2005    Family History  Problem Relation Age of Onset  . Colon cancer Father   . Colon cancer      aunt  . Colon cancer      both GMs   . Colon cancer      uncle  . Diabetes Father     elderly  . Hyperlipidemia Father   . Hyperlipidemia Mother   . Other Brother     transient global amnesia  . Other Brother    transient global amnesia  . Crohn's disease Daughter   . Coronary artery disease Father 82  . Coronary artery disease Maternal Uncle 53    History   Social History  . Marital Status: Legally Separated    Spouse Name: N/A    Number of Children: 3  . Years of Education: N/A   Occupational History  .  [employer]   Social History Main Topics  . Smoking status: Former Smoker    Quit date: 05/22/1990  . Smokeless tobacco: Not on file  . Alcohol Use: Yes     occas  . Drug Use: No  . Sexually Active: Not on file   Other Topics Concern  . Not on file   Social History Narrative   HH of 1 kid out to college 1 dogSleep 5-7 hoursWorks textiles covits omega 3 b complex ca vit    ROS:  52 lb weight loss this year (intentional).  Otherwise as stated in the HPI and negative for all other systems.    PHYSICAL EXAM BP 130/80  Pulse 76  Resp  18  Ht 5\' 5"  (1.651 m)  Wt 214 lb 1.9 oz (97.124 kg)  BMI 35.63 kg/m2  LMP 12/23/2010 GENERAL:  Well appearing HEENT:  Pupils equal round and reactive, fundi not visualized, oral mucosa unremarkable NECK:  No jugular venous distention, waveform within normal limits, carotid upstroke brisk and symmetric, no bruits, no thyromegaly LYMPHATICS:  No cervical, inguinal adenopathy LUNGS:  Clear to auscultation bilaterally BACK:  No CVA tenderness CHEST:  Unremarkable HEART:  PMI not displaced or sustained,S1 and S2 within normal limits, no S3, no S4, no clicks, no rubs, no murmurs ABD:  Flat, positive bowel sounds normal in frequency in pitch, no bruits, no rebound, no guarding, no midline pulsatile mass, no hepatomegaly, no splenomegaly, obese EXT:  2 plus pulses throughout, no edema, no cyanosis no clubbing SKIN:  No rashes no nodules NEURO:  Cranial nerves II through XII grossly intact, motor grossly intact throughout PSYCH:  Cognitively intact, oriented to person place and time   EKG:  Sinus rhythm, rate 80, axis within normal limits,  intervals within normal limits, no acute ST-T wave changes.   ASSESSMENT AND PLAN

## 2011-02-10 NOTE — Assessment & Plan Note (Signed)
I applaud her weight loss and encourage more of the same.  We talked about [facility]

## 2011-02-10 NOTE — Patient Instructions (Signed)
Your physician has requested that you have an exercise tolerance test. For further information please visit [email or web address]. Please also follow instruction sheet, as given.   

## 2011-02-16 ENCOUNTER — Telehealth: Payer: Self-pay | Admitting: Internal Medicine

## 2011-02-16 ENCOUNTER — Encounter: Payer: Self-pay | Admitting: *Deleted

## 2011-02-16 ENCOUNTER — Ambulatory Visit
Admission: RE | Admit: 2011-02-16 | Discharge: 2011-02-16 | Disposition: A | Discharge status: Home / Self Care | Payer: Managed Care, Other (non HMO) | Source: Ambulatory Visit | Attending: Obstetrics and Gynecology | Admitting: Obstetrics and Gynecology

## 2011-02-16 DIAGNOSIS — Z1231 Encounter for screening mammogram for malignant neoplasm of breast: Secondary | ICD-10-CM

## 2011-02-16 NOTE — Telephone Encounter (Signed)
Pt aware of results 

## 2011-02-16 NOTE — Telephone Encounter (Signed)
Pt would like bloodwork results also please mail copy of labs to pt.

## 2011-02-16 NOTE — Telephone Encounter (Signed)
See result note send her a copy also

## 2011-03-10 ENCOUNTER — Ambulatory Visit (INDEPENDENT_AMBULATORY_CARE_PROVIDER_SITE_OTHER): Payer: Managed Care, Other (non HMO) | Admitting: Cardiology

## 2011-03-10 DIAGNOSIS — R079 Chest pain, unspecified: Secondary | ICD-10-CM

## 2011-03-10 NOTE — Progress Notes (Signed)
Exercise Treadmill Test  Pre-Exercise Testing Evaluation Rhythm: normal sinus  Rate: 90   PR:  .12 QRS:  .07  QT:  .36 QTc: .44     Test  Exercise Tolerance Test Ordering MD: Angelina Sheriff, MD  Interpreting MD:  Angelina Sheriff, MD  Unique Test No: 1  Treadmill:  1  Indication for ETT: chest pain - rule out ischemia  Contraindication to ETT: No   Stress Modality: exercise - treadmill  Cardiac Imaging Performed: non   Protocol: standard Bruce - maximal  Max BP:  144/89  Max MPHR (bpm):  168 85% MPR (bpm):  142  MPHR obtained (bpm):  171 % MPHR obtained:  102  Reached 85% MPHR (min:sec): 1:30 Total Exercise Time (min-sec):  6:30  Workload in METS:  7.6 Borg Scale: 14  Reason ETT Terminated:  desired heart rate attained    ST Segment Analysis At Rest: normal ST segments - no evidence of significant ST depression With Exercise: no evidence of significant ST depression  Other Information Arrhythmia:  Yes Angina during ETT:  absent (0) Quality of ETT:  diagnostic  ETT Interpretation:  normal - no evidence of ischemia by ST analysis  Comments: The patient had an excellent exercise tolerance.  There was no chest pain.  There was an appropriate level of dyspnea.  There was an abnormal heart rate response and normal BP response.  There were no ischemic ST T wave changes and a normal heart rate recovery.  She did have rare PVCs  Recommendations: Negative adequate ETT.  No further testing is indicated.  Based on the above I gave the patient a prescription for exercise.

## 2011-03-13 ENCOUNTER — Telehealth: Payer: Self-pay | Admitting: Internal Medicine

## 2011-03-13 DIAGNOSIS — Z86010 Personal history of colonic polyps: Secondary | ICD-10-CM

## 2011-03-13 NOTE — Telephone Encounter (Signed)
Pls advise.  

## 2011-03-13 NOTE — Telephone Encounter (Signed)
Saw cardiologist on Friday and everything was fine. That dr thinks that it is gi related. Wants referral to Dr Arlyce Dice. That's who she saw when she had a colonoscopy. Thanks.

## 2011-03-14 NOTE — Telephone Encounter (Signed)
Ok to do referral?  

## 2011-03-16 ENCOUNTER — Encounter: Payer: Self-pay | Admitting: Gastroenterology

## 2011-04-11 ENCOUNTER — Encounter: Payer: Self-pay | Admitting: Gastroenterology

## 2011-04-11 ENCOUNTER — Ambulatory Visit (INDEPENDENT_AMBULATORY_CARE_PROVIDER_SITE_OTHER): Payer: Managed Care, Other (non HMO) | Admitting: Gastroenterology

## 2011-04-11 DIAGNOSIS — Z800 Family history of malignant neoplasm of digestive organs: Secondary | ICD-10-CM | POA: Insufficient documentation

## 2011-04-11 DIAGNOSIS — K5900 Constipation, unspecified: Secondary | ICD-10-CM

## 2011-04-11 DIAGNOSIS — Z86010 Personal history of colonic polyps: Secondary | ICD-10-CM

## 2011-04-11 DIAGNOSIS — R1310 Dysphagia, unspecified: Secondary | ICD-10-CM

## 2011-04-11 MED ORDER — RABEPRAZOLE SODIUM 20 MG PO TBEC: 20.0000 mg | DELAYED_RELEASE_TABLET | Freq: Every day | ORAL | Status: DC

## 2011-04-11 NOTE — Assessment & Plan Note (Signed)
This is due to functional constipation.  Recommendations #1 fiber supplementation; if not improved would consider enrollment in a constipation trial

## 2011-04-11 NOTE — Progress Notes (Signed)
History of Present Illness: Tricia [name] is a pleasant 50 year old white female referred at the request of Dr. Fabian Sharp for evaluation of dysphagia. She has developed dysphagia to solids and, to a lesser extent,  liquids.  With dysphagia she may have minor odynophagia. She complains of chest tightness, bloating and mild pyrosis.  Patient's family history is pertinent for father with colon cancer. Colonoscopy one year ago demonstrated adenomatous polyps. A daughter has Crohn's disease. She complains of constipation. She has a bowel movement about every third day. In between she feels bloated.    Past Medical History  Diagnosis Date  . MVP (mitral valve prolapse)     echo 02 normal  . Jaundice due to hepatitis     hx when younger  . Glucosuria     with normal blood sugar  . Meningioma   . Migraine headache     some aura  period related  imitrex helps  . Colon polyps     2006 Adenomatous polyp  . Seizures   . HLD (hyperlipidemia)    Past Surgical History  Procedure Date  . Appendectomy   . Tubal ligation   . Frontal meningioma resected 1998  . Cin d&c conization 2000  . Bunionectomy     laft  . Left shoulder dislocation surgery 2005   family history includes Colon cancer in her father, paternal aunt, paternal uncle, and unspecified family member; Coronary artery disease (age of onset:72) in her father; Coronary artery disease (age of onset:74) in her maternal uncle; Crohn's disease in her daughter; Diabetes in her fathers; Hyperlipidemia in her father; and Other in her brother. Current Outpatient Prescriptions  Medication Sig Dispense Refill  . B Complex-C (SUPER B COMPLEX PO) Take 1 tablet by mouth daily.        . cholecalciferol (VITAMIN D) 1000 UNITS tablet Take 1,000 Units by mouth daily.        . Cyanocobalamin (VITAMIN B 12 PO) Take 1 tablet by mouth daily.        . Multiple Vitamin (MULTIVITAMIN PO) Take 1 tablet by mouth daily.        . OMEGA-3 1000 MG CAPS Take 1 capsule by  mouth daily.        Marland Kitchen PHENobarbital (LUMINAL) 97.2 MG tablet Take 97.2 mg by mouth at bedtime.       Marland Kitchen PRESCRIPTION MEDICATION Activella la 0.5mg /0.1mg        . SUMAtriptan (IMITREX) 100 MG tablet Take 1 tablet (100 mg total) by mouth once as needed for migraine (can repeat in 2 hours ).  9 tablet  4   Allergies as of 04/11/2011 - Review Complete 04/11/2011  Allergen Reaction Noted  . Codeine sulfate    . Dilantin Rash 02/08/2011    reports that she quit smoking about 20 years ago. She has never used smokeless tobacco. She reports that she drinks alcohol. She reports that she does not use illicit drugs.     Review of Systems: She has lost 80 pounds to 2 a weight reduction diet. Pertinent positive and negative review of systems were noted in the above HPI section. All other review of systems were otherwise negative.  Vital signs were reviewed in today's medical record Physical Exam: General: Well developed , well nourished, no acute distress Head: Normocephalic and atraumatic Eyes:  sclerae anicteric, EOMI Ears: Normal auditory acuity Mouth: No deformity or lesions Neck: Supple, no masses or thyromegaly Lungs: Clear throughout to auscultation Heart: Regular rate and rhythm; no murmurs, rubs  or bruits Abdomen: Soft, non tender and non distended. No masses, hepatosplenomegaly or hernias noted. Normal Bowel sounds Rectal:deferred Musculoskeletal: Symmetrical with no gross deformities  Skin: No lesions on visible extremities Pulses:  Normal pulses noted Extremities: No clubbing, cyanosis, edema or deformities noted Neurological: Alert oriented x 4, grossly nonfocal Cervical Nodes:  No significant cervical adenopathy Inguinal Nodes: No significant inguinal adenopathy Psychological:  Alert and cooperative. Normal mood and affect

## 2011-04-11 NOTE — Patient Instructions (Signed)
Upper GI Endoscopy Upper GI endoscopy means using a flexible scope to look at the esophagus, stomach, and upper small bowel. This is done to make a diagnosis in people with heartburn, abdominal pain, or abnormal bleeding. Sometimes an endoscope is needed to remove foreign bodies or food that become stuck in the esophagus; it can also be used to take biopsy samples. For the best results, do not eat or drink for 8 hours before having your upper endoscopy.  To perform the endoscopy, you will probably be sedated and your throat will be numbed with a special spray. The endoscope is then slowly passed down your throat (this will not interfere with your breathing). An endoscopy exam takes 15 to 30 minutes to complete and there is no real pain. Patients rarely remember much about the procedure. The results of the test may take several days if a biopsy or other test is taken.  You may have a sore throat after an endoscopy exam. Serious complications are very rare. Stick to liquids and soft foods until your pain is better. Do not drive a car or operate any dangerous equipment for at least 24 hours after being sedated. SEEK IMMEDIATE MEDICAL CARE IF:   You have severe throat pain.   You have shortness of breath.   You have bleeding problems.   You have a fever.   You have difficulty recovering from your sedation.  Document Released: 06/15/2004 Document Revised: 01/18/2011 Document Reviewed: 05/10/2008 [facility] Patient Information 2012 [place], [place].

## 2011-04-11 NOTE — Assessment & Plan Note (Signed)
In the setting of ongoing GERD I suspect that she has a peptic stricture  Recommendations #1 begin AcipHex 20 mg daily #2 upper endoscopy with dilatation as indicated  Risks, alternatives, and complications of the procedure, including bleeding, perforation, and possible need for surgery, were explained to the patient.  Patient's questions were answered.

## 2011-04-11 NOTE — Assessment & Plan Note (Signed)
Plan follow-up colonoscopy 2016 

## 2011-04-12 ENCOUNTER — Encounter: Payer: Self-pay | Admitting: Gastroenterology

## 2011-04-17 ENCOUNTER — Other Ambulatory Visit: Payer: Self-pay | Admitting: Gastroenterology

## 2011-04-17 ENCOUNTER — Ambulatory Visit (AMBULATORY_SURGERY_CENTER): Payer: Managed Care, Other (non HMO) | Admitting: Gastroenterology

## 2011-04-17 ENCOUNTER — Encounter: Payer: Self-pay | Admitting: Gastroenterology

## 2011-04-17 DIAGNOSIS — R1310 Dysphagia, unspecified: Secondary | ICD-10-CM

## 2011-04-17 DIAGNOSIS — K259 Gastric ulcer, unspecified as acute or chronic, without hemorrhage or perforation: Secondary | ICD-10-CM

## 2011-04-17 DIAGNOSIS — K222 Esophageal obstruction: Secondary | ICD-10-CM

## 2011-04-17 DIAGNOSIS — K2940 Chronic atrophic gastritis without bleeding: Secondary | ICD-10-CM

## 2011-04-17 MED ORDER — RABEPRAZOLE SODIUM 20 MG PO TBEC: 20.0000 mg | DELAYED_RELEASE_TABLET | Freq: Every day | ORAL | Status: DC

## 2011-04-17 MED ORDER — SODIUM CHLORIDE 0.9 % IV SOLN: 500.0000 mL | INTRAVENOUS | Status: DC

## 2011-04-17 NOTE — Telephone Encounter (Signed)
Medication sent to pharmacy  

## 2011-04-17 NOTE — Patient Instructions (Signed)
ESOPHAGEAL STRICTURE  ULCERS IN STOMACH  AWAIT BIOPSY RESULTS  SEE GREEN AND BLUE SHEETS FOR ADDITIONAL D/C INSTRUCTIONS

## 2011-04-17 NOTE — Progress Notes (Signed)
Patient did not experience any of the following events: a burn prior to discharge; a fall within the facility; wrong site/side/patient/procedure/implant event; or a hospital transfer or hospital admission upon discharge from the facility. (G8907) Patient did not have preoperative order for IV antibiotic SSI prophylaxis. ([id number])  

## 2011-04-18 ENCOUNTER — Telehealth: Payer: Self-pay | Admitting: *Deleted

## 2011-04-18 DIAGNOSIS — K219 Gastro-esophageal reflux disease without esophagitis: Secondary | ICD-10-CM

## 2011-04-18 MED ORDER — RABEPRAZOLE SODIUM 20 MG PO TBEC: 20.0000 mg | DELAYED_RELEASE_TABLET | Freq: Every day | ORAL | Status: DC

## 2011-04-18 MED ORDER — PANTOPRAZOLE SODIUM 40 MG PO TBEC: 40.0000 mg | DELAYED_RELEASE_TABLET | Freq: Every day | ORAL | Status: DC

## 2011-04-18 NOTE — Telephone Encounter (Signed)
Begin Protonix 40 mg daily (generic is available)

## 2011-04-18 NOTE — Telephone Encounter (Signed)
Dr Arlyce Dice,  I assume 30 tablets but any refills?

## 2011-04-18 NOTE — Telephone Encounter (Signed)
Addended by: Marlowe Kays on: 04/18/2011 08:29 AM   Modules accepted: Orders

## 2011-04-18 NOTE — Telephone Encounter (Signed)
2 refill

## 2011-04-18 NOTE — Telephone Encounter (Signed)
Prescription sent to pharmacy listed on pt chart. Called to inform pt of this, pt did not answer, message left on voicemail. whs

## 2011-04-18 NOTE — Telephone Encounter (Signed)
Pt states that yesterday she was given Aciphex and she needs a  Generic medicine because of the cost. Can you change this please?  E mccraw rn

## 2011-04-18 NOTE — Telephone Encounter (Signed)
Message left  No answer

## 2011-04-24 ENCOUNTER — Telehealth: Payer: Self-pay | Admitting: Gastroenterology

## 2011-04-24 ENCOUNTER — Encounter: Payer: Self-pay | Admitting: Internal Medicine

## 2011-04-24 NOTE — Telephone Encounter (Signed)
Discussed with pt the biopsy results. Pt requested a diet for stomach ulcers. Diet mailed to pt.

## 2011-07-05 ENCOUNTER — Other Ambulatory Visit: Payer: Self-pay | Admitting: Gastroenterology

## 2011-09-30 ENCOUNTER — Other Ambulatory Visit: Payer: Self-pay | Admitting: Gastroenterology

## 2011-10-03 ENCOUNTER — Other Ambulatory Visit: Payer: Self-pay | Admitting: Obstetrics and Gynecology

## 2011-10-03 DIAGNOSIS — Z1231 Encounter for screening mammogram for malignant neoplasm of breast: Secondary | ICD-10-CM

## 2011-12-20 ENCOUNTER — Other Ambulatory Visit: Payer: Self-pay | Admitting: Gastroenterology

## 2012-02-19 ENCOUNTER — Ambulatory Visit
Admission: RE | Admit: 2012-02-19 | Discharge: 2012-02-19 | Disposition: A | Discharge status: Home / Self Care | Payer: Managed Care, Other (non HMO) | Source: Ambulatory Visit | Attending: Obstetrics and Gynecology | Admitting: Obstetrics and Gynecology

## 2012-02-19 DIAGNOSIS — Z1231 Encounter for screening mammogram for malignant neoplasm of breast: Secondary | ICD-10-CM

## 2012-03-11 ENCOUNTER — Ambulatory Visit (INDEPENDENT_AMBULATORY_CARE_PROVIDER_SITE_OTHER): Payer: Managed Care, Other (non HMO) | Admitting: Internal Medicine

## 2012-03-11 ENCOUNTER — Encounter: Payer: Self-pay | Admitting: Internal Medicine

## 2012-03-11 VITALS — BP 122/82 | HR 87 | Temp 98.4°F | Ht 65.26 in | Wt 205.0 lb

## 2012-03-11 DIAGNOSIS — E785 Hyperlipidemia, unspecified: Secondary | ICD-10-CM

## 2012-03-11 DIAGNOSIS — L920 Granuloma annulare: Secondary | ICD-10-CM | POA: Insufficient documentation

## 2012-03-11 DIAGNOSIS — Z23 Encounter for immunization: Secondary | ICD-10-CM

## 2012-03-11 DIAGNOSIS — D329 Benign neoplasm of meninges, unspecified: Secondary | ICD-10-CM

## 2012-03-11 DIAGNOSIS — H9209 Otalgia, unspecified ear: Secondary | ICD-10-CM

## 2012-03-11 DIAGNOSIS — L538 Other specified erythematous conditions: Secondary | ICD-10-CM

## 2012-03-11 DIAGNOSIS — Z86010 Personal history of colonic polyps: Secondary | ICD-10-CM

## 2012-03-11 DIAGNOSIS — E669 Obesity, unspecified: Secondary | ICD-10-CM

## 2012-03-11 DIAGNOSIS — G43909 Migraine, unspecified, not intractable, without status migrainosus: Secondary | ICD-10-CM

## 2012-03-11 DIAGNOSIS — Z0000 Encounter for general adult medical examination without abnormal findings: Secondary | ICD-10-CM

## 2012-03-11 DIAGNOSIS — H9202 Otalgia, left ear: Secondary | ICD-10-CM | POA: Insufficient documentation

## 2012-03-11 DIAGNOSIS — D320 Benign neoplasm of cerebral meninges: Secondary | ICD-10-CM

## 2012-03-11 LAB — CBC WITH DIFFERENTIAL/PLATELET
Eosinophils Relative: 2.9 % (ref 0.0–5.0)
HCT: 43.8 % (ref 36.0–46.0)
Lymphocytes Relative: 27.3 % (ref 12.0–46.0)
Monocytes Relative: 6.2 % (ref 3.0–12.0)
Neutrophils Relative %: 62.4 % (ref 43.0–77.0)
Platelets: 265 10*3/uL (ref 150.0–400.0)
WBC: 7.5 10*3/uL (ref 4.5–10.5)

## 2012-03-11 LAB — HEPATIC FUNCTION PANEL
ALT: 21 U/L (ref 0–35)
AST: 16 U/L (ref 0–37)
Alkaline Phosphatase: 79 U/L (ref 39–117)
Bilirubin, Direct: 0 mg/dL (ref 0.0–0.3)
Total Bilirubin: 0.4 mg/dL (ref 0.3–1.2)

## 2012-03-11 LAB — BASIC METABOLIC PANEL
BUN: 10 mg/dL (ref 6–23)
Creatinine, Ser: 0.7 mg/dL (ref 0.4–1.2)
GFR: 89.83 mL/min (ref >60.00)
Potassium: 4.5 mEq/L (ref 3.5–5.1)

## 2012-03-11 LAB — LIPID PANEL: Total CHOL/HDL Ratio: 4

## 2012-03-11 LAB — TSH: TSH: 2.15 u[IU]/mL (ref 0.35–5.50)

## 2012-03-11 MED ORDER — SUMATRIPTAN SUCCINATE 100 MG PO TABS: 100.0000 mg | ORAL_TABLET | Freq: Once | ORAL | Status: DC | PRN

## 2012-03-11 NOTE — Patient Instructions (Addendum)
Will notify you  of labs when available.  Continue healthy lifestyle attempts adding some type of physical activity exercise yoga would be helpful. See her dentist about the ear pain continue here he Senexon saline. Consider adding nasal cortisone. If persistent and/or progressive contact us and we can have you see an ear nose and throat Dr. I do not see any ear infection or significant wax in her ear today.  cpx in a year or as needed  Preventive Care for Adults, Female A healthy lifestyle and preventive care can promote health and wellness. Preventive health guidelines for women include the following key practices.  A routine yearly physical is a good way to check with your caregiver about your health and preventive screening. It is a chance to share any concerns and updates on your health, and to receive a thorough exam.  Visit your dentist for a routine exam and preventive care every 6 months. Brush your teeth twice a day and floss once a day. Good oral hygiene prevents tooth decay and gum disease.  The frequency of eye exams is based on your age, health, family medical history, use of contact lenses, and other factors. Follow your caregiver's recommendations for frequency of eye exams.  Eat a healthy diet. Foods like vegetables, fruits, whole grains, low-fat dairy products, and lean protein foods contain the nutrients you need without too many calories. Decrease your intake of foods high in solid fats, added sugars, and salt. Eat the right amount of calories for you.Get information about a proper diet from your caregiver, if necessary.  Regular physical exercise is one of the most important things you can do for your health. Most adults should get at least 150 minutes of moderate-intensity exercise (any activity that increases your heart rate and causes you to sweat) each week. In addition, most adults need muscle-strengthening exercises on 2 or more days a week.  Maintain a healthy weight.  The body mass index (BMI) is a screening tool to identify possible weight problems. It provides an estimate of body fat based on height and weight. Your caregiver can help determine your BMI, and can help you achieve or maintain a healthy weight.For adults 20 years and older:  A BMI below 18.5 is considered underweight.  A BMI of 18.5 to 24.9 is normal.  A BMI of 25 to 29.9 is considered overweight.  A BMI of 30 and above is considered obese.  Maintain normal blood lipids and cholesterol levels by exercising and minimizing your intake of saturated fat. Eat a balanced diet with plenty of fruit and vegetables. Blood tests for lipids and cholesterol should begin at age 68 and be repeated every 5 years. If your lipid or cholesterol levels are high, you are over 50, or you are at high risk for heart disease, you may need your cholesterol levels checked more frequently.Ongoing high lipid and cholesterol levels should be treated with medicines if diet and exercise are not effective.  If you smoke, find out from your caregiver how to quit. If you do not use tobacco, do not start.  If you are pregnant, do not drink alcohol. If you are breastfeeding, be very cautious about drinking alcohol. If you are not pregnant and choose to drink alcohol, do not exceed 1 drink per day. One drink is considered to be 12 ounces (355 mL) of beer, 5 ounces (148 mL) of wine, or 1.5 ounces (44 mL) of liquor.  Avoid use of street drugs. Do not share needles with  anyone. Ask for help if you need support or instructions about stopping the use of drugs.  High blood pressure causes heart disease and increases the risk of stroke. Your blood pressure should be checked at least every 1 to 2 years. Ongoing high blood pressure should be treated with medicines if weight loss and exercise are not effective.  If you are 32 to 53 years old, ask your caregiver if you should take aspirin to prevent strokes.  Diabetes screening involves  taking a blood sample to check your fasting blood sugar level. This should be done once every 3 years, after age 26, if you are within normal weight and without risk factors for diabetes. Testing should be considered at a younger age or be carried out more frequently if you are overweight and have at least 1 risk factor for diabetes.  Breast cancer screening is essential preventive care for women. You should practice "breast self-awareness." This means understanding the normal appearance and feel of your breasts and may include breast self-examination. Any changes detected, no matter how small, should be reported to a caregiver. Women in their 65s and 30s should have a clinical breast exam (CBE) by a caregiver as part of a regular health exam every 1 to 3 years. After age 37, women should have a CBE every year. Starting at age 83, women should consider having a mammography (breast X-ray test) every year. Women who have a family history of breast cancer should talk to their caregiver about genetic screening. Women at a high risk of breast cancer should talk to their caregivers about having magnetic resonance imaging (MRI) and a mammography every year.  The Pap test is a screening test for cervical cancer. A Pap test can show cell changes on the cervix that might become cervical cancer if left untreated. A Pap test is a procedure in which cells are obtained and examined from the lower end of the uterus (cervix).  Women should have a Pap test starting at age 66.  Between ages 68 and 42, Pap tests should be repeated every 2 years.  Beginning at age 36, you should have a Pap test every 3 years as long as the past 3 Pap tests have been normal.  Some women have medical problems that increase the chance of getting cervical cancer. Talk to your caregiver about these problems. It is especially important to talk to your caregiver if a new problem develops soon after your last Pap test. In these cases, your  caregiver may recommend more frequent screening and Pap tests.  The above recommendations are the same for women who have or have not gotten the vaccine for human papillomavirus (HPV).  If you had a hysterectomy for a problem that was not cancer or a condition that could lead to cancer, then you no longer need Pap tests. Even if you no longer need a Pap test, a regular exam is a good idea to make sure no other problems are starting.  If you are between ages 47 and 43, and you have had normal Pap tests going back 10 years, you no longer need Pap tests. Even if you no longer need a Pap test, a regular exam is a good idea to make sure no other problems are starting.  If you have had past treatment for cervical cancer or a condition that could lead to cancer, you need Pap tests and screening for cancer for at least 20 years after your treatment.  If Pap tests have  been discontinued, risk factors (such as a new sexual partner) need to be reassessed to determine if screening should be resumed.  The HPV test is an additional test that may be used for cervical cancer screening. The HPV test looks for the virus that can cause the cell changes on the cervix. The cells collected during the Pap test can be tested for HPV. The HPV test could be used to screen women aged 74 years and older, and should be used in women of any age who have unclear Pap test results. After the age of 55, women should have HPV testing at the same frequency as a Pap test.  Colorectal cancer can be detected and often prevented. Most routine colorectal cancer screening begins at the age of 28 and continues through age 37. However, your caregiver may recommend screening at an earlier age if you have risk factors for colon cancer. On a yearly basis, your caregiver may provide home test kits to check for hidden blood in the stool. Use of a small camera at the end of a tube, to directly examine the colon (sigmoidoscopy or colonoscopy), can  detect the earliest forms of colorectal cancer. Talk to your caregiver about this at age 40, when routine screening begins. Direct examination of the colon should be repeated every 5 to 10 years through age 72, unless early forms of pre-cancerous polyps or small growths are found.  Hepatitis C blood testing is recommended for all people born from [birth year] through 1965 and any individual with known risks for hepatitis C.  Practice safe sex. Use condoms and avoid high-risk sexual practices to reduce the spread of sexually transmitted infections (STIs). STIs include gonorrhea, chlamydia, syphilis, trichomonas, herpes, HPV, and human immunodeficiency virus (HIV). Herpes, HIV, and HPV are viral illnesses that have no cure. They can result in disability, cancer, and death. Sexually active women aged 77 and younger should be checked for chlamydia. Older women with new or multiple partners should also be tested for chlamydia. Testing for other STIs is recommended if you are sexually active and at increased risk.  Osteoporosis is a disease in which the bones lose minerals and strength with aging. This can result in serious bone fractures. The risk of osteoporosis can be identified using a bone density scan. Women ages 56 and over and women at risk for fractures or osteoporosis should discuss screening with their caregivers. Ask your caregiver whether you should take a calcium supplement or vitamin D to reduce the rate of osteoporosis.  Menopause can be associated with physical symptoms and risks. Hormone replacement therapy is available to decrease symptoms and risks. You should talk to your caregiver about whether hormone replacement therapy is right for you.  Use sunscreen with sun protection factor (SPF) of 30 or more. Apply sunscreen liberally and repeatedly throughout the day. You should seek shade when your shadow is shorter than you. Protect yourself by wearing long sleeves, pants, a wide-brimmed hat, and  sunglasses year round, whenever you are outdoors.  Once a month, do a whole body skin exam, using a mirror to look at the skin on your back. Notify your caregiver of new moles, moles that have irregular borders, moles that are larger than a pencil eraser, or moles that have changed in shape or color.  Stay current with required immunizations.  Influenza. You need a dose every fall (or winter). The composition of the flu vaccine changes each year, so being vaccinated once is not enough.  Pneumococcal polysaccharide. You  need 1 to 2 doses if you smoke cigarettes or if you have certain chronic medical conditions. You need 1 dose at age 42 (or older) if you have never been vaccinated.  Tetanus, diphtheria, pertussis (Tdap, Td). Get 1 dose of Tdap vaccine if you are younger than age 25, are over 20 and have contact with an infant, are a Research scientist (physical sciences), are pregnant, or simply want to be protected from whooping cough. After that, you need a Td booster dose every 10 years. Consult your caregiver if you have not had at least 3 tetanus and diphtheria-containing shots sometime in your life or have a deep or dirty wound.  HPV. You need this vaccine if you are a woman age 30 or younger. The vaccine is given in 3 doses over 6 months.  Measles, mumps, rubella (MMR). You need at least 1 dose of MMR if you were born in 1957 or later. You may also need a second dose.  Meningococcal. If you are age 36 to 83 and a first-year college student living in a residence hall, or have one of several medical conditions, you need to get vaccinated against meningococcal disease. You may also need additional booster doses.  Zoster (shingles). If you are age 80 or older, you should get this vaccine.  Varicella (chickenpox). If you have never had chickenpox or you were vaccinated but received only 1 dose, talk to your caregiver to find out if you need this vaccine.  Hepatitis A. You need this vaccine if you have a specific  risk factor for hepatitis A virus infection or you simply wish to be protected from this disease. The vaccine is usually given as 2 doses, 6 to 18 months apart.  Hepatitis B. You need this vaccine if you have a specific risk factor for hepatitis B virus infection or you simply wish to be protected from this disease. The vaccine is given in 3 doses, usually over 6 months. Preventive Services / Frequency Ages 42 to 3  Blood pressure check.** / Every 1 to 2 years.  Lipid and cholesterol check.** / Every 5 years beginning at age 38.  Clinical breast exam.** / Every 3 years for women in their 56s and 30s.  Pap test.** / Every 2 years from ages 71 through 45. Every 3 years starting at age 80 through age 52 or 72 with a history of 3 consecutive normal Pap tests.  HPV screening.** / Every 3 years from ages 71 through ages 63 to 12 with a history of 3 consecutive normal Pap tests.  Hepatitis C blood test.** / For any individual with known risks for hepatitis C.  Skin self-exam. / Monthly.  Influenza immunization.** / Every year.  Pneumococcal polysaccharide immunization.** / 1 to 2 doses if you smoke cigarettes or if you have certain chronic medical conditions.  Tetanus, diphtheria, pertussis (Tdap, Td) immunization. / A one-time dose of Tdap vaccine. After that, you need a Td booster dose every 10 years.  HPV immunization. / 3 doses over 6 months, if you are 21 and younger.  Measles, mumps, rubella (MMR) immunization. / You need at least 1 dose of MMR if you were born in 1957 or later. You may also need a second dose.  Meningococcal immunization. / 1 dose if you are age 7 to 71 and a first-year college student living in a residence hall, or have one of several medical conditions, you need to get vaccinated against meningococcal disease. You may also need additional booster doses.  Varicella immunization.** / Consult your caregiver.  Hepatitis A immunization.** / Consult your caregiver. 2  doses, 6 to 18 months apart.  Hepatitis B immunization.** / Consult your caregiver. 3 doses usually over 6 months. Ages 81 to 37  Blood pressure check.** / Every 1 to 2 years.  Lipid and cholesterol check.** / Every 5 years beginning at age 49.  Clinical breast exam.** / Every year after age 54.  Mammogram.** / Every year beginning at age 37 and continuing for as long as you are in good health. Consult with your caregiver.  Pap test.** / Every 3 years starting at age 49 through age 78 or 61 with a history of 3 consecutive normal Pap tests.  HPV screening.** / Every 3 years from ages 61 through ages 42 to 58 with a history of 3 consecutive normal Pap tests.  Fecal occult blood test (FOBT) of stool. / Every year beginning at age 85 and continuing until age 17. You may not need to do this test if you get a colonoscopy every 10 years.  Flexible sigmoidoscopy or colonoscopy.** / Every 5 years for a flexible sigmoidoscopy or every 10 years for a colonoscopy beginning at age 29 and continuing until age 63.  Hepatitis C blood test.** / For all people born from [birth year] through 1965 and any individual with known risks for hepatitis C.  Skin self-exam. / Monthly.  Influenza immunization.** / Every year.  Pneumococcal polysaccharide immunization.** / 1 to 2 doses if you smoke cigarettes or if you have certain chronic medical conditions.  Tetanus, diphtheria, pertussis (Tdap, Td) immunization.** / A one-time dose of Tdap vaccine. After that, you need a Td booster dose every 10 years.  Measles, mumps, rubella (MMR) immunization. / You need at least 1 dose of MMR if you were born in 1957 or later. You may also need a second dose.  Varicella immunization.** / Consult your caregiver.  Meningococcal immunization.** / Consult your caregiver.  Hepatitis A immunization.** / Consult your caregiver. 2 doses, 6 to 18 months apart.  Hepatitis B immunization.** / Consult your caregiver. 3 doses, usually  over 6 months. Ages 93 and over  Blood pressure check.** / Every 1 to 2 years.  Lipid and cholesterol check.** / Every 5 years beginning at age 44.  Clinical breast exam.** / Every year after age 2.  Mammogram.** / Every year beginning at age 21 and continuing for as long as you are in good health. Consult with your caregiver.  Pap test.** / Every 3 years starting at age 76 through age 7 or 54 with a 3 consecutive normal Pap tests. Testing can be stopped between 65 and 70 with 3 consecutive normal Pap tests and no abnormal Pap or HPV tests in the past 10 years.  HPV screening.** / Every 3 years from ages 56 through ages 55 or 70 with a history of 3 consecutive normal Pap tests. Testing can be stopped between 65 and 70 with 3 consecutive normal Pap tests and no abnormal Pap or HPV tests in the past 10 years.  Fecal occult blood test (FOBT) of stool. / Every year beginning at age 18 and continuing until age 11. You may not need to do this test if you get a colonoscopy every 10 years.  Flexible sigmoidoscopy or colonoscopy.** / Every 5 years for a flexible sigmoidoscopy or every 10 years for a colonoscopy beginning at age 76 and continuing until age 62.  Hepatitis C blood test.** / For all people born  from 1945 through 1965 and any individual with known risks for hepatitis C.  Osteoporosis screening.** / A one-time screening for women ages 82 and over and women at risk for fractures or osteoporosis.  Skin self-exam. / Monthly.  Influenza immunization.** / Every year.  Pneumococcal polysaccharide immunization.** / 1 dose at age 104 (or older) if you have never been vaccinated.  Tetanus, diphtheria, pertussis (Tdap, Td) immunization. / A one-time dose of Tdap vaccine if you are over 65 and have contact with an infant, are a Research scientist (physical sciences), or simply want to be protected from whooping cough. After that, you need a Td booster dose every 10 years.  Varicella immunization.** / Consult your  caregiver.  Meningococcal immunization.** / Consult your caregiver.  Hepatitis A immunization.** / Consult your caregiver. 2 doses, 6 to 18 months apart.  Hepatitis B immunization.** / Check with your caregiver. 3 doses, usually over 6 months. ** Family history and personal history of risk and conditions may change your caregiver's recommendations. Document Released: 07/04/2001 Document Revised: 07/31/2011 Document Reviewed: 10/03/2010 [facility] Patient Information 2013 [place], [place].

## 2012-03-11 NOTE — Assessment & Plan Note (Signed)
About one every other month  imitrex helps ok to refill

## 2012-03-11 NOTE — Progress Notes (Signed)
Subjective:    Patient ID: Tricia [name], female    DOB: 09/28/1960, 51 y.o.   MRN: [id number]  HPI Patient comes in today for preventive visit and follow-up of medical issues. Update  history since  last visit: No major change in health status since last visit . Has form for work to sign to get reduction in premium  . didi biometrics at work. New problem with pain left ear  X 3 weeks comes and goes lasts for hours and throbs at times radiated to neck and shoulder . Had hx of similar problem when in 20s and had ear wax problem No fever st fever chills. May have a dental problem in that area but no pain with chewing or swallowing or popping has mild sinus congestion per patient.tried mucinex  HA s stable  imitrex used about once very 2 months and works well without sig se.  Trying to lose weight  Little sleep  Hours  Stress from job  Doesn't like  Job . Hard to shut mind of when goes to bed.  Obesity trying to lose wieght meningioma hx sees Dr Newell Coral yearly.  HCM had pap per dr Teodoro Kil utd on mammogram   Review of Systems ROS:  GEN/ HEENT: No fever, significant weight changes sweats headaches vision problems hearing changes, CV/ PULM; No chest pain shortness of breath cough, syncope,edema  change in exercise tolerance. GI /GU: No adominal pain, vomiting, change in bowel habits. No blood in the stool. No significant GU symptoms. SKIN/HEME: ,no acute skin rashes suspicious lesions or bleeding. No lymphadenopathy, nodules, masses.  NEURO/ PSYCH:  No neurologic signs such as weakness numbness IMM/ Allergy: No unusual infections.  Allergy .   REST of 12 system review negative except as per HPI Past history family history social history reviewed in the electronic medical record.     Objective:   Physical Exam BP 122/82  Pulse 87  Temp 98.4 F (36.9 C) (Oral)  Ht 5' 5.26" (1.658 m)  Wt 205 lb (92.987 kg)  BMI 33.84 kg/m2  SpO2 96%  LMP 12/21/2010 Physical Exam: Vital signs  reviewed KGM:WNUU is a well-developed well-nourished alert cooperative  white female who appears her stated age in no acute distress.  HEENT: normocephalic atraumatic , Eyes: PERRL EOM's full, conjunctiva clear, Nares: paten,t no deformity discharge or tenderness., Ears: no deformity EAC's clear TMs with normal landmarks. Mouth: clear OP, no lesions, edema.  Moist mucous membranes. Dentition in adequate repair. No tmj click  NECK: supple without masses, thyromegaly or bruits. CHEST/PULM:  Clear to auscultation and percussion breath sounds equal no wheeze , rales or rhonchi. No chest wall deformities or tenderness. Breast: normal by inspection . No dimpling, discharge, masses, tenderness or discharge . CV: PMI is nondisplaced, S1 S2 no gallops, murmurs, rubs. Peripheral pulses are full without delay.No JVD .  ABDOMEN: Bowel sounds normal nontender  No guard or rebound, no hepato splenomegal no CVA tenderness.  No hernia. Extremtities:  No clubbing cyanosis or edema, no acute joint swelling or redness no focal atrophy NEURO:  Oriented x3, cranial nerves 3-12 appear to be intact, no obvious focal weakness,gait within normal limits no abnormal reflexes or asymmetrical SKIN: No acute rashes normal turgor, color, no bruising or petechiae.  Round annular granulare noted  PSYCH: Oriented, good eye contact, no obvious depression anxiety, cognition and judgment appear normal. LN: no cervical axillary inguinal adenopathy     Assessment & Plan:  Preventive Health Care Counseled regarding healthy nutrition,  exercise, sleep, injury prevention, calcium vit d and healthy weight . Pap per gyne   Gi evaluation per dr Arlyce Dice Migraine ha  Refill med for now ear pain  Granuloma annulare Obesity  Hx meningioma LIPIDS recheck Intensify lifestyle interventions. Form signed for pt

## 2012-03-19 ENCOUNTER — Ambulatory Visit (INDEPENDENT_AMBULATORY_CARE_PROVIDER_SITE_OTHER): Payer: Managed Care, Other (non HMO) | Admitting: Gastroenterology

## 2012-03-19 ENCOUNTER — Ambulatory Visit: Payer: Managed Care, Other (non HMO) | Admitting: Gastroenterology

## 2012-03-19 ENCOUNTER — Encounter: Payer: Self-pay | Admitting: Gastroenterology

## 2012-03-19 VITALS — BP 110/78 | HR 72 | Ht 65.75 in | Wt 205.8 lb

## 2012-03-19 DIAGNOSIS — Z860100 Personal history of colon polyps, unspecified: Secondary | ICD-10-CM

## 2012-03-19 DIAGNOSIS — K219 Gastro-esophageal reflux disease without esophagitis: Secondary | ICD-10-CM

## 2012-03-19 NOTE — Assessment & Plan Note (Signed)
Symptoms are well-controlled on daily protonic is. The patient will attempt to wean.

## 2012-03-19 NOTE — Patient Instructions (Addendum)
We will renew your Protonix for 1 year Follow up as needed

## 2012-03-19 NOTE — Assessment & Plan Note (Signed)
Plan follow-up colonoscopy 2016 

## 2012-03-19 NOTE — Progress Notes (Signed)
History of Present Illness:  Follow visit for this patient with history of GERD and colon polyps. On daily protimix she has no GI complaints.  Adenomatous polyps were removed in 2006 10 2011. Family history is pertinent for father and father's siblings and grandmothers with colon cancer.    Past Medical History  Diagnosis Date  . MVP (mitral valve prolapse)     echo 02 normal  . Jaundice due to hepatitis     hx when younger unknown cause resolved   . Glucosuria     with normal blood sugar  . Meningioma   . Migraine headache     some aura  period related  imitrex helps  . Colon polyps     2006 Adenomatous polyp  . Seizures   . HLD (hyperlipidemia)   . Hepatitis C aby neg viral copies     serology positive but neg viral copies    Past Surgical History  Procedure Date  . Appendectomy   . Tubal ligation   . Frontal meningioma resected 1998  . Cin d&c conization 2000  . Bunionectomy     laft  . Left shoulder dislocation surgery 2005   family history includes Colon cancer in her father, paternal aunt, paternal uncle, and unspecified family member; Coronary artery disease (age of onset:72) in her father; Coronary artery disease (age of onset:74) in her maternal uncle; Crohn's disease in her daughter; Diabetes in her fathers; Hyperlipidemia in her father; and Other in her brother. Current Outpatient Prescriptions  Medication Sig Dispense Refill  . Aspirin-Salicylamide-Caffeine (BC HEADACHE POWDER PO) Take 1 packet by mouth as needed.      . cholecalciferol (VITAMIN D) 1000 UNITS tablet Take 1,000 Units by mouth daily.        . Cyanocobalamin (VITAMIN B 12 PO) Take 1 tablet by mouth daily.        . Estradiol-Norethindrone Acet 0.5-0.1 MG per tablet Take by mouth daily.      . Multiple Vitamin (MULTIVITAMIN PO) Take 1 tablet by mouth daily.        . OMEGA-3 1000 MG CAPS Take 1 capsule by mouth daily.        . pantoprazole (PROTONIX) 40 MG tablet TAKE 1 TABLET (40 MG TOTAL) BY MOUTH  DAILY.  30 tablet  2  . PHENobarbital (LUMINAL) 97.2 MG tablet Take 97.2 mg by mouth at bedtime.       . SUMAtriptan (IMITREX) 100 MG tablet Take 1 tablet (100 mg total) by mouth once as needed for migraine (can repeat in 2 hours ).  9 tablet  3   Allergies as of 03/19/2012 - Review Complete 03/19/2012  Allergen Reaction Noted  . Codeine sulfate    . Phenytoin sodium extended Rash 02/08/2011    reports that she quit smoking about 21 years ago. She has never used smokeless tobacco. She reports that she does not drink alcohol or use illicit drugs.     Review of Systems: The patient has lost 75 pounds intentionally by dieting Pertinent positive and negative review of systems were noted in the above HPI section. All other review of systems were otherwise negative.  Vital signs were reviewed in today's medical record Physical Exam: General: Well developed , well nourished, no acute distress Head: Normocephalic and atraumatic Eyes:  sclerae anicteric, EOMI Ears: Normal auditory acuity Mouth: No deformity or lesions Neck: Supple, no masses or thyromegaly Lungs: Clear throughout to auscultation Heart: Regular rate and rhythm; no murmurs, rubs or bruits Abdomen:  Soft, non tender and non distended. No masses, hepatosplenomegaly or hernias noted. Normal Bowel sounds Rectal:deferred Musculoskeletal: Symmetrical with no gross deformities  Skin: No lesions on visible extremities Pulses:  Normal pulses noted Extremities: No clubbing, cyanosis, edema or deformities noted Neurological: Alert oriented x 4, grossly nonfocal Cervical Nodes:  No significant cervical adenopathy Inguinal Nodes: No significant inguinal adenopathy Psychological:  Alert and cooperative. Normal mood and affect

## 2012-03-20 ENCOUNTER — Other Ambulatory Visit: Payer: Self-pay | Admitting: Gastroenterology

## 2012-06-26 ENCOUNTER — Other Ambulatory Visit: Payer: Self-pay | Admitting: Gastroenterology

## 2012-09-20 ENCOUNTER — Other Ambulatory Visit: Payer: Self-pay | Admitting: Gastroenterology

## 2012-11-04 ENCOUNTER — Telehealth: Payer: Self-pay | Admitting: Gastroenterology

## 2012-11-05 MED ORDER — PANTOPRAZOLE SODIUM 40 MG PO TBEC: 40.0000 mg | DELAYED_RELEASE_TABLET | Freq: Every day | ORAL | Status: DC

## 2012-11-05 NOTE — Telephone Encounter (Signed)
Sent in 90 day for pt. As requested  Patient aware

## 2012-11-12 ENCOUNTER — Telehealth: Payer: Self-pay | Admitting: Gastroenterology

## 2012-11-14 NOTE — Telephone Encounter (Signed)
Patient aware med sent for 90 day supply to [facility]

## 2012-11-28 ENCOUNTER — Other Ambulatory Visit: Payer: Self-pay | Admitting: *Deleted

## 2013-01-21 ENCOUNTER — Ambulatory Visit: Payer: Managed Care, Other (non HMO) | Admitting: Family Medicine

## 2013-05-09 ENCOUNTER — Other Ambulatory Visit: Payer: Self-pay

## 2013-05-09 DIAGNOSIS — Z1231 Encounter for screening mammogram for malignant neoplasm of breast: Secondary | ICD-10-CM

## 2013-05-22 HISTORY — PX: CHOLECYSTECTOMY: SHX55

## 2013-06-09 ENCOUNTER — Ambulatory Visit
Admission: RE | Admit: 2013-06-09 | Discharge: 2013-06-09 | Disposition: A | Discharge status: Home / Self Care | Payer: Self-pay | Source: Ambulatory Visit

## 2013-06-09 DIAGNOSIS — Z1231 Encounter for screening mammogram for malignant neoplasm of breast: Secondary | ICD-10-CM

## 2013-07-23 ENCOUNTER — Ambulatory Visit (INDEPENDENT_AMBULATORY_CARE_PROVIDER_SITE_OTHER): Payer: BC Managed Care – PPO | Admitting: Internal Medicine

## 2013-07-23 ENCOUNTER — Encounter: Payer: Self-pay | Admitting: Internal Medicine

## 2013-07-23 VITALS — BP 102/64 | HR 82 | Temp 98.8°F | Ht 65.5 in | Wt 217.0 lb

## 2013-07-23 DIAGNOSIS — Z0000 Encounter for general adult medical examination without abnormal findings: Secondary | ICD-10-CM

## 2013-07-23 DIAGNOSIS — E669 Obesity, unspecified: Secondary | ICD-10-CM

## 2013-07-23 DIAGNOSIS — E785 Hyperlipidemia, unspecified: Secondary | ICD-10-CM

## 2013-07-23 DIAGNOSIS — L538 Other specified erythematous conditions: Secondary | ICD-10-CM

## 2013-07-23 DIAGNOSIS — L920 Granuloma annulare: Secondary | ICD-10-CM

## 2013-07-23 DIAGNOSIS — D320 Benign neoplasm of cerebral meninges: Secondary | ICD-10-CM

## 2013-07-23 DIAGNOSIS — G43909 Migraine, unspecified, not intractable, without status migrainosus: Secondary | ICD-10-CM

## 2013-07-23 LAB — HEPATIC FUNCTION PANEL
ALK PHOS: 82 U/L (ref 39–117)
ALT: 23 U/L (ref 0–35)
AST: 20 U/L (ref 0–37)
Albumin: 3.7 g/dL (ref 3.5–5.2)
BILIRUBIN DIRECT: 0 mg/dL (ref 0.0–0.3)
Total Bilirubin: 0.6 mg/dL (ref 0.3–1.2)
Total Protein: 6.8 g/dL (ref 6.0–8.3)

## 2013-07-23 LAB — POCT URINALYSIS DIP (MANUAL ENTRY)
BILIRUBIN UA: NEGATIVE
Glucose, UA: NEGATIVE
Ketones, POC UA: NEGATIVE
Leukocytes, UA: NEGATIVE
Nitrite, UA: NEGATIVE
PH UA: 5.5
Protein Ur, POC: NEGATIVE
Spec Grav, UA: 1.025
UROBILINOGEN UA: 0.2

## 2013-07-23 LAB — CBC WITH DIFFERENTIAL/PLATELET
BASOS ABS: 0 10*3/uL (ref 0.0–0.1)
Basophils Relative: 0.8 % (ref 0.0–3.0)
Eosinophils Absolute: 0.3 10*3/uL (ref 0.0–0.7)
Eosinophils Relative: 4.5 % (ref 0.0–5.0)
HCT: 42.5 % (ref 36.0–46.0)
Hemoglobin: 14 g/dL (ref 12.0–15.0)
LYMPHS PCT: 31.2 % (ref 12.0–46.0)
Lymphs Abs: 1.7 10*3/uL (ref 0.7–4.0)
MCHC: 32.9 g/dL (ref 30.0–36.0)
MCV: 89.7 fl (ref 78.0–100.0)
MONOS PCT: 7.4 % (ref 3.0–12.0)
Monocytes Absolute: 0.4 10*3/uL (ref 0.1–1.0)
Neutro Abs: 3.1 10*3/uL (ref 1.4–7.7)
Neutrophils Relative %: 56.1 % (ref 43.0–77.0)
PLATELETS: 239 10*3/uL (ref 150.0–400.0)
RBC: 4.73 Mil/uL (ref 3.87–5.11)
RDW: 12.8 % (ref 11.5–14.6)
WBC: 5.6 10*3/uL (ref 4.5–10.5)

## 2013-07-23 LAB — BASIC METABOLIC PANEL
BUN: 15 mg/dL (ref 6–23)
CALCIUM: 8.8 mg/dL (ref 8.4–10.5)
CO2: 26 meq/L (ref 19–32)
CREATININE: 0.8 mg/dL (ref 0.4–1.2)
Chloride: 106 mEq/L (ref 96–112)
GFR: 80.29 mL/min (ref >60.00)
GLUCOSE: 71 mg/dL (ref 70–99)
Potassium: 4.3 mEq/L (ref 3.5–5.1)
Sodium: 139 mEq/L (ref 135–145)

## 2013-07-23 LAB — LIPID PANEL
Cholesterol: 207 mg/dL — ABNORMAL HIGH (ref 0–200)
HDL: 59.4 mg/dL (ref >39.00)
LDL CALC: 134 mg/dL — AB (ref 0–99)
Total CHOL/HDL Ratio: 3
Triglycerides: 67 mg/dL (ref 0.0–149.0)
VLDL: 13.4 mg/dL (ref 0.0–40.0)

## 2013-07-23 LAB — T4, FREE: Free T4: 0.65 ng/dL (ref 0.60–1.60)

## 2013-07-23 LAB — TSH: TSH: 1.97 u[IU]/mL (ref 0.35–5.50)

## 2013-07-23 NOTE — Assessment & Plan Note (Signed)
Mri this year stable stay on luminal

## 2013-07-23 NOTE — Patient Instructions (Signed)
Continue lifestyle intervention healthy eating and exercise . Weight loss can help. Exercise  And healthy eating . Consider compression stockings  15-20 mm on work days.  Will notify you  of labs when available. Yearly check

## 2013-07-23 NOTE — Progress Notes (Signed)
Chief Complaint  Patient presents with  . Annual Exam    HPI: Patient comes in today for Preventive Health Care visit  No major change in health status since last visit . Lost jpob and insurance and then another  . Now working full time   Gained weight from stress.  Ha once a month  Dr Lupita Leash  Last MRI  Stable and med once a year.  Had mammo and pap this year  Health Maintenance  Topic Date Due  . Influenza Vaccine  12/20/2013  . Mammogram  06/10/2015  . Pap Smear  07/02/2016  . Colonoscopy  02/05/2020  . Tetanus/tdap  02/07/2021   Health Maintenance Review   ROS:  GEN/ HEENT: No fever, significant weight changes sweats headaches vision problems hearing changes, CV/ PULM; No chest pain shortness of breath cough, syncope,edema  change in exercise tolerance. GI /GU: No adominal pain, vomiting, change in bowel habits. No blood in the stool. No significant GU symptoms. SKIN/HEME: ,no acute skin rashes suspicious lesions or bleeding. No lymphadenopathy, nodules, masses.  NEURO/ PSYCH:  No neurologic signs such as weakness numbness. No depression anxiety. IMM/ Allergy: No unusual infections.  Allergy .   REST of 12 system review negative except as per HPI legs swell at end of day  Near ankle  No pain   Past Medical History  Diagnosis Date  . MVP (mitral valve prolapse)     echo 02 normal  . Jaundice due to hepatitis     hx when younger unknown cause resolved   . Glucosuria     with normal blood sugar  . Meningioma   . Migraine headache     some aura  period related  imitrex helps  . Colon polyps     2006 Adenomatous polyp  . Seizures   . HLD (hyperlipidemia)   . Hepatitis C aby neg viral copies     serology positive but neg viral copies     Family History  Problem Relation Age of Onset  . Colon cancer Father   . Colon cancer Paternal Aunt   . Colon cancer      both GMs   . Colon cancer Paternal Uncle   . Diabetes Father     elderly  . Hyperlipidemia  Father   . Other Brother     transient global amnesia  . Crohn's disease Daughter   . Coronary artery disease Father 3    MI  . Coronary artery disease Maternal Uncle 74  . Diabetes Father     History   Social History  . Marital Status: Legally Separated    Spouse Name: N/A    Number of Children: 3  . Years of Education: N/A   Occupational History  . accounting [employer]   Social History Main Topics  . Smoking status: Former Smoker    Quit date: 05/22/1990  . Smokeless tobacco: Never Used  . Alcohol Use: No     Comment: occas  . Drug Use: No  . Sexual Activity: None   Other Topics Concern  . None   Social History Narrative   HH of 1 kid out to college    1 dog   Sleep 5-7 hours   Works [employer] fund[employer] now Set designer    vits omega 3 b complex ca vit    Outpatient Encounter Prescriptions as of 07/23/2013  Medication Sig  . Aspirin-Salicylamide-Caffeine (BC HEADACHE POWDER PO) Take 1 packet by mouth as needed.  Marland Kitchen  cholecalciferol (VITAMIN D) 1000 UNITS tablet Take 1,000 Units by mouth daily.    . Cyanocobalamin (VITAMIN B 12 PO) Take 1 tablet by mouth daily.    . Estradiol-Norethindrone Acet 0.5-0.1 MG per tablet Take by mouth daily.  . Multiple Vitamin (MULTIVITAMIN PO) Take 1 tablet by mouth daily.    . OMEGA-3 1000 MG CAPS Take 1 capsule by mouth daily.    Marland Kitchen PHENobarbital (LUMINAL) 97.2 MG tablet Take 97.2 mg by mouth at bedtime.   . pantoprazole (PROTONIX) 40 MG tablet Take 1 tablet (40 mg total) by mouth daily.  . SUMAtriptan (IMITREX) 100 MG tablet Take 1 tablet (100 mg total) by mouth once as needed for migraine (can repeat in 2 hours ).    EXAM:  BP 102/64  Pulse 82  Temp(Src) 98.8 F (37.1 C) (Oral)  Ht 5' 5.5" (1.664 m)  Wt 217 lb (98.431 kg)  BMI 35.55 kg/m2  SpO2 98%  LMP 12/21/2010  Body mass index is 35.55 kg/(m^2). Wt Readings from Last 3 Encounters:  07/23/13 217 lb (98.431 kg)  03/19/12 205 lb 12.8 oz (93.35 kg)    03/11/12 205 lb (92.987 kg)    Physical Exam: Vital signs reviewed PPJ:KDTO is a well-developed well-nourished alert cooperative   female who appears her stated age in no acute distress.  HEENT: normocephalic atraumatic , Eyes: PERRL EOM's full, conjunctiva clear, Nares: paten,t no deformity discharge or tenderness., Ears: no deformity EAC's clear TMs with normal landmarks. Mouth: clear OP, no lesions, edema.  Moist mucous membranes. Dentition in adequate repair. NECK: supple without masses, thyromegaly or bruits. CHEST/PULM:  Clear to auscultation and percussion breath sounds equal no wheeze , rales or rhonchi. No chest wall deformities or tenderness. Breast:exam per gyne CV: PMI is nondisplaced, S1 S2 no gallops, murmurs, rubs. Peripheral pulses are full without delay.No JVD .  ABDOMEN: Bowel sounds normal nontender  No guard or rebound, no hepato splenomegal no CVA tenderness.  No hernia. Extremtities:  No clubbing cyanosis ;minimal  edema,  Few vv no acute joint swelling or redness no focal atrophy NEURO:  Oriented x3, cranial nerves 3-12 appear to be intact, no obvious focal weakness,gait within normal limits no abnormal reflexes or asymmetrical SKIN: No acute rashes normal turgor, color, no bruising or petechiae.  Granuloma annularre  3 spots one faded  PSYCH: Oriented, good eye contact, no obvious depression anxiety, cognition and judgment appear normal. 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 Weight loss can help. Exercise  And  healthy eating . Consider compression stockings  15-20 mm on work days.  Will notify you  of labs when available. Yearly check    Standley Brooking. Panosh M.D.   Pre visit review using our clinic review tool, if applicable. No additional management support is needed unless otherwise documented below in the visit note.

## 2013-12-05 ENCOUNTER — Inpatient Hospital Stay (HOSPITAL_COMMUNITY)
Admission: EM | Admit: 2013-12-05 | Discharge: 2013-12-07 | DRG: 446 | Disposition: A | Discharge status: Home / Self Care | Payer: Managed Care, Other (non HMO) | Attending: Internal Medicine | Admitting: Internal Medicine

## 2013-12-05 ENCOUNTER — Observation Stay (HOSPITAL_COMMUNITY): Payer: Managed Care, Other (non HMO)

## 2013-12-05 ENCOUNTER — Emergency Department (HOSPITAL_COMMUNITY): Payer: Managed Care, Other (non HMO)

## 2013-12-05 ENCOUNTER — Encounter (HOSPITAL_COMMUNITY): Payer: Self-pay | Admitting: Emergency Medicine

## 2013-12-05 DIAGNOSIS — R7401 Elevation of levels of liver transaminase levels: Secondary | ICD-10-CM | POA: Diagnosis present

## 2013-12-05 DIAGNOSIS — R748 Abnormal levels of other serum enzymes: Secondary | ICD-10-CM | POA: Diagnosis present

## 2013-12-05 DIAGNOSIS — G43009 Migraine without aura, not intractable, without status migrainosus: Secondary | ICD-10-CM

## 2013-12-05 DIAGNOSIS — I059 Rheumatic mitral valve disease, unspecified: Secondary | ICD-10-CM | POA: Diagnosis present

## 2013-12-05 DIAGNOSIS — Z8711 Personal history of peptic ulcer disease: Secondary | ICD-10-CM

## 2013-12-05 DIAGNOSIS — Z87891 Personal history of nicotine dependence: Secondary | ICD-10-CM

## 2013-12-05 DIAGNOSIS — Z7982 Long term (current) use of aspirin: Secondary | ICD-10-CM

## 2013-12-05 DIAGNOSIS — K8050 Calculus of bile duct without cholangitis or cholecystitis without obstruction: Secondary | ICD-10-CM | POA: Diagnosis present

## 2013-12-05 DIAGNOSIS — Z860100 Personal history of colon polyps, unspecified: Secondary | ICD-10-CM

## 2013-12-05 DIAGNOSIS — R740 Nonspecific elevation of levels of transaminase and lactic acid dehydrogenase [LDH]: Secondary | ICD-10-CM

## 2013-12-05 DIAGNOSIS — E785 Hyperlipidemia, unspecified: Secondary | ICD-10-CM | POA: Diagnosis present

## 2013-12-05 DIAGNOSIS — G40909 Epilepsy, unspecified, not intractable, without status epilepticus: Secondary | ICD-10-CM | POA: Diagnosis present

## 2013-12-05 DIAGNOSIS — I498 Other specified cardiac arrhythmias: Secondary | ICD-10-CM | POA: Diagnosis present

## 2013-12-05 DIAGNOSIS — R079 Chest pain, unspecified: Secondary | ICD-10-CM

## 2013-12-05 DIAGNOSIS — Z8249 Family history of ischemic heart disease and other diseases of the circulatory system: Secondary | ICD-10-CM

## 2013-12-05 DIAGNOSIS — Z800 Family history of malignant neoplasm of digestive organs: Secondary | ICD-10-CM

## 2013-12-05 DIAGNOSIS — K219 Gastro-esophageal reflux disease without esophagitis: Secondary | ICD-10-CM | POA: Diagnosis present

## 2013-12-05 DIAGNOSIS — Z8619 Personal history of other infectious and parasitic diseases: Secondary | ICD-10-CM

## 2013-12-05 DIAGNOSIS — G43909 Migraine, unspecified, not intractable, without status migrainosus: Secondary | ICD-10-CM | POA: Diagnosis present

## 2013-12-05 DIAGNOSIS — Z888 Allergy status to other drugs, medicaments and biological substances status: Secondary | ICD-10-CM

## 2013-12-05 DIAGNOSIS — Z79899 Other long term (current) drug therapy: Secondary | ICD-10-CM

## 2013-12-05 DIAGNOSIS — R7402 Elevation of levels of lactic acid dehydrogenase (LDH): Secondary | ICD-10-CM | POA: Diagnosis present

## 2013-12-05 DIAGNOSIS — Z885 Allergy status to narcotic agent status: Secondary | ICD-10-CM

## 2013-12-05 DIAGNOSIS — K8020 Calculus of gallbladder without cholecystitis without obstruction: Secondary | ICD-10-CM

## 2013-12-05 DIAGNOSIS — Z833 Family history of diabetes mellitus: Secondary | ICD-10-CM

## 2013-12-05 DIAGNOSIS — Z86011 Personal history of benign neoplasm of the brain: Secondary | ICD-10-CM

## 2013-12-05 LAB — COMPREHENSIVE METABOLIC PANEL
ALBUMIN: 4.1 g/dL (ref 3.5–5.2)
ALK PHOS: 225 U/L — AB (ref 39–117)
ALT: 791 U/L — ABNORMAL HIGH (ref 0–35)
AST: 1125 U/L — ABNORMAL HIGH (ref 0–37)
Anion gap: 12 (ref 5–15)
BILIRUBIN TOTAL: 1.6 mg/dL — AB (ref 0.3–1.2)
BUN: 12 mg/dL (ref 6–23)
CHLORIDE: 100 meq/L (ref 96–112)
CO2: 26 mEq/L (ref 19–32)
Calcium: 9.4 mg/dL (ref 8.4–10.5)
Creatinine, Ser: 0.76 mg/dL (ref 0.50–1.10)
GFR calc Af Amer: >90 mL/min (ref >90)
GFR calc non Af Amer: >90 mL/min (ref >90)
Glucose, Bld: 113 mg/dL — ABNORMAL HIGH (ref 70–99)
POTASSIUM: 4.2 meq/L (ref 3.7–5.3)
Sodium: 138 mEq/L (ref 137–147)
Total Protein: 7.7 g/dL (ref 6.0–8.3)

## 2013-12-05 LAB — URINALYSIS, ROUTINE W REFLEX MICROSCOPIC
Glucose, UA: 100 mg/dL — AB
HGB URINE DIPSTICK: NEGATIVE
KETONES UR: NEGATIVE mg/dL
Leukocytes, UA: NEGATIVE
Nitrite: NEGATIVE
PROTEIN: NEGATIVE mg/dL
Specific Gravity, Urine: 1.021 (ref 1.005–1.030)
UROBILINOGEN UA: 1 mg/dL (ref 0.0–1.0)
pH: 6 (ref 5.0–8.0)

## 2013-12-05 LAB — I-STAT TROPONIN, ED: Troponin i, poc: 0 ng/mL (ref 0.00–0.08)

## 2013-12-05 LAB — CBC WITH DIFFERENTIAL/PLATELET
BASOS PCT: 1 % (ref 0–1)
Basophils Absolute: 0 10*3/uL (ref 0.0–0.1)
EOS ABS: 0.2 10*3/uL (ref 0.0–0.7)
EOS PCT: 3 % (ref 0–5)
HEMATOCRIT: 46.4 % — AB (ref 36.0–46.0)
Hemoglobin: 15.1 g/dL — ABNORMAL HIGH (ref 12.0–15.0)
Lymphocytes Relative: 20 % (ref 12–46)
Lymphs Abs: 1.4 10*3/uL (ref 0.7–4.0)
MCH: 28.8 pg (ref 26.0–34.0)
MCHC: 32.5 g/dL (ref 30.0–36.0)
MCV: 88.5 fL (ref 78.0–100.0)
MONO ABS: 0.6 10*3/uL (ref 0.1–1.0)
Monocytes Relative: 9 % (ref 3–12)
Neutro Abs: 4.7 10*3/uL (ref 1.7–7.7)
Neutrophils Relative %: 69 % (ref 43–77)
Platelets: 281 10*3/uL (ref 150–400)
RBC: 5.24 MIL/uL — ABNORMAL HIGH (ref 3.87–5.11)
RDW: 12.5 % (ref 11.5–15.5)
WBC: 6.9 10*3/uL (ref 4.0–10.5)

## 2013-12-05 LAB — LIPASE, BLOOD: Lipase: 51 U/L (ref 11–59)

## 2013-12-05 LAB — PROTIME-INR
INR: 0.91 (ref 0.00–1.49)
Prothrombin Time: 12.3 seconds (ref 11.6–15.2)

## 2013-12-05 LAB — HEPATITIS PANEL, ACUTE
HCV Ab: REACTIVE — AB
Hep A IgM: NONREACTIVE
Hep B C IgM: NONREACTIVE
Hepatitis B Surface Ag: NEGATIVE

## 2013-12-05 MED ORDER — ALUM & MAG HYDROXIDE-SIMETH 200-200-20 MG/5ML PO SUSP: 15.0000 mL | ORAL | Status: DC | PRN

## 2013-12-05 MED ORDER — HYDROMORPHONE HCL PF 1 MG/ML IJ SOLN
1.0000 mg | Freq: Once | INTRAMUSCULAR | Status: AC | PRN
  Administered 2013-12-05: 1 mg via INTRAVENOUS
  Filled 2013-12-05: qty 1

## 2013-12-05 MED ORDER — HYDROMORPHONE HCL PF 1 MG/ML IJ SOLN
1.0000 mg | INTRAMUSCULAR | Status: DC | PRN
Start: 2013-12-05 — End: 2013-12-05
  Administered 2013-12-05: 1 mg via INTRAVENOUS

## 2013-12-05 MED ORDER — HYDROMORPHONE HCL PF 1 MG/ML IJ SOLN
1.0000 mg | INTRAMUSCULAR | Status: DC | PRN
  Administered 2013-12-05: 1 mg via INTRAVENOUS
  Filled 2013-12-05: qty 1

## 2013-12-05 MED ORDER — PHENOBARBITAL 32.4 MG PO TABS
97.2000 mg | ORAL_TABLET | Freq: Every day | ORAL | Status: DC
  Administered 2013-12-05 – 2013-12-06 (×2): 97.2 mg via ORAL
  Filled 2013-12-05 (×2): qty 3

## 2013-12-05 MED ORDER — ASPIRIN 81 MG PO CHEW
324.0000 mg | CHEWABLE_TABLET | Freq: Once | ORAL | Status: AC
  Administered 2013-12-05: 324 mg via ORAL
  Filled 2013-12-05: qty 4

## 2013-12-05 MED ORDER — SUMATRIPTAN SUCCINATE 100 MG PO TABS
100.0000 mg | ORAL_TABLET | Freq: Once | ORAL | Status: AC | PRN
  Administered 2013-12-05 – 2013-12-06 (×2): 100 mg via ORAL
  Filled 2013-12-05 (×4): qty 1

## 2013-12-05 MED ORDER — ONDANSETRON HCL 4 MG/2ML IJ SOLN
4.0000 mg | Freq: Three times a day (TID) | INTRAMUSCULAR | Status: DC | PRN
  Filled 2013-12-05: qty 2

## 2013-12-05 MED ORDER — HYDROMORPHONE HCL PF 1 MG/ML IJ SOLN
1.0000 mg | Freq: Once | INTRAMUSCULAR | Status: AC
  Administered 2013-12-05: 1 mg via INTRAVENOUS
  Filled 2013-12-05: qty 1

## 2013-12-05 MED ORDER — ADULT MULTIVITAMIN W/MINERALS CH
1.0000 | ORAL_TABLET | Freq: Every day | ORAL | Status: DC
  Administered 2013-12-06 – 2013-12-07 (×2): 1 via ORAL
  Filled 2013-12-05 (×3): qty 1

## 2013-12-05 MED ORDER — POLYETHYLENE GLYCOL 3350 17 G PO PACK
17.0000 g | PACK | Freq: Every day | ORAL | Status: DC | PRN
  Filled 2013-12-05: qty 1

## 2013-12-05 MED ORDER — SODIUM CHLORIDE 0.9 % IV SOLN
INTRAVENOUS | Status: DC
  Administered 2013-12-05 – 2013-12-07 (×4): via INTRAVENOUS

## 2013-12-05 MED ORDER — ONDANSETRON HCL 4 MG/2ML IJ SOLN
4.0000 mg | Freq: Four times a day (QID) | INTRAMUSCULAR | Status: DC | PRN
Start: 2013-12-05 — End: 2013-12-07
  Administered 2013-12-05: 4 mg via INTRAVENOUS
  Filled 2013-12-05: qty 2

## 2013-12-05 MED ORDER — PROMETHAZINE HCL 25 MG/ML IJ SOLN
12.5000 mg | Freq: Four times a day (QID) | INTRAMUSCULAR | Status: DC | PRN
  Administered 2013-12-05 – 2013-12-07 (×3): 12.5 mg via INTRAVENOUS
  Filled 2013-12-05 (×3): qty 1

## 2013-12-05 MED ORDER — DOCUSATE SODIUM 100 MG PO CAPS
100.0000 mg | ORAL_CAPSULE | Freq: Two times a day (BID) | ORAL | Status: DC
  Administered 2013-12-05 – 2013-12-07 (×4): 100 mg via ORAL
  Filled 2013-12-05 (×5): qty 1

## 2013-12-05 MED ORDER — BISACODYL 10 MG RE SUPP: 10.0000 mg | Freq: Every day | RECTAL | Status: DC | PRN

## 2013-12-05 MED ORDER — GADOBENATE DIMEGLUMINE 529 MG/ML IV SOLN
18.0000 mL | Freq: Once | INTRAVENOUS | Status: AC | PRN
  Administered 2013-12-05: 18 mL via INTRAVENOUS

## 2013-12-05 MED ORDER — FAMOTIDINE IN NACL 20-0.9 MG/50ML-% IV SOLN
20.0000 mg | Freq: Once | INTRAVENOUS | Status: AC
  Administered 2013-12-05: 20 mg via INTRAVENOUS
  Filled 2013-12-05: qty 50

## 2013-12-05 MED ORDER — PANTOPRAZOLE SODIUM 40 MG IV SOLR
40.0000 mg | Freq: Two times a day (BID) | INTRAVENOUS | Status: DC
  Administered 2013-12-05 – 2013-12-07 (×4): 40 mg via INTRAVENOUS
  Filled 2013-12-05 (×5): qty 40

## 2013-12-05 MED ORDER — MORPHINE SULFATE 4 MG/ML IJ SOLN
4.0000 mg | INTRAMUSCULAR | Status: DC | PRN
  Administered 2013-12-05 – 2013-12-07 (×6): 4 mg via INTRAVENOUS
  Filled 2013-12-05 (×6): qty 1

## 2013-12-05 MED ORDER — ONDANSETRON HCL 4 MG PO TABS: 4.0000 mg | ORAL_TABLET | Freq: Four times a day (QID) | ORAL | Status: DC | PRN

## 2013-12-05 MED ORDER — VITAMIN B-12 1000 MCG PO TABS
1000.0000 ug | ORAL_TABLET | Freq: Every day | ORAL | Status: DC
  Administered 2013-12-06 – 2013-12-07 (×2): 1000 ug via ORAL
  Filled 2013-12-05 (×3): qty 1

## 2013-12-05 MED ORDER — ONDANSETRON HCL 4 MG/2ML IJ SOLN
4.0000 mg | Freq: Three times a day (TID) | INTRAMUSCULAR | Status: DC | PRN
  Administered 2013-12-05: 4 mg via INTRAVENOUS

## 2013-12-05 MED ORDER — ESTRADIOL-NORETHINDRONE ACET 0.5-0.1 MG PO TABS: 1.0000 | ORAL_TABLET | Freq: Every day | ORAL | Status: DC

## 2013-12-05 MED ORDER — HEPARIN SODIUM (PORCINE) 5000 UNIT/ML IJ SOLN
5000.0000 [IU] | Freq: Three times a day (TID) | INTRAMUSCULAR | Status: DC
  Administered 2013-12-05 – 2013-12-06 (×5): 5000 [IU] via SUBCUTANEOUS
  Filled 2013-12-05 (×9): qty 1

## 2013-12-05 MED ORDER — VITAMIN D3 25 MCG (1000 UNIT) PO TABS
1000.0000 [IU] | ORAL_TABLET | Freq: Every day | ORAL | Status: DC
  Administered 2013-12-06 – 2013-12-07 (×2): 1000 [IU] via ORAL
  Filled 2013-12-05 (×2): qty 1

## 2013-12-05 MED ORDER — GI COCKTAIL ~~LOC~~
30.0000 mL | Freq: Once | ORAL | Status: AC
  Administered 2013-12-05: 30 mL via ORAL
  Filled 2013-12-05: qty 30

## 2013-12-05 MED ORDER — HYDROMORPHONE HCL PF 1 MG/ML IJ SOLN
1.0000 mg | INTRAMUSCULAR | Status: DC | PRN
  Filled 2013-12-05: qty 1

## 2013-12-05 MED ORDER — ONDANSETRON HCL 4 MG/2ML IJ SOLN
4.0000 mg | Freq: Once | INTRAMUSCULAR | Status: AC
  Administered 2013-12-05: 4 mg via INTRAVENOUS
  Filled 2013-12-05: qty 2

## 2013-12-05 NOTE — ED Provider Notes (Signed)
CSN: [id number]     Arrival date & time 12/05/13  7353 History   First MD Initiated Contact with Patient 12/05/13 [phone]     Chief Complaint  Patient presents with  . Chest Pain  . Abdominal Pain     (Consider location/radiation/quality/duration/timing/severity/associated sxs/prior Treatment) Patient is a 53 y.o. female presenting with chest pain and abdominal pain.  Chest Pain Associated symptoms: abdominal pain, nausea and palpitations   Abdominal Pain Associated symptoms: chest pain and nausea   Associated symptoms: no chills    Tricia [name] is a(n) 53 y.o. female who presents to the emergency department with chief complaint of abdominal pain, chest pain and back pain.. past medical history of mitral valve prolapse, meningioma and chronic recurrent migraine headaches, history of seizures, Gastric ulcer and GERD. Presents emergency department with abdominal pain. Patient c/o 2 weeks of intermittent aching pain in her R flank. Patient states she at a [employer] last night, approximately 2 hours later she developed severe epigastric abdominal pain radiating to her back, chest, lower abdomen. She describes her pain as "it feels like something is trying to claw its way out of my abdomen."  States that it is 8/10. Intermittent, unchanged in its course.She states she has a sensation of pressure and bloating from her hips only up to her neck. She has associated nausea without diaphoresis or vomiting. Patient took Pepto-Bismol without relief of her symptoms last night. The patient is to take Protonix but has not been taking any acid reducers. She has a history of previous stricture and dilatation of the esophagus. Patient also admits to taking more than 3 BC powders weekly due to her chronic headaches. She has intermittent palpitations when her pain is bad. Risk factors for acute coronary syndrome including obesity, hyperlipidemia. She is a previous smoking history, no history of diabetes,  hypertension or positive family history. Denies fevers, chills, myalgias, arthralgias. Denies DOE, SOB. Denies dysuria, flank pain, suprapubic pain, frequency, urgency, or hematuria. Denies headaches, light headedness, weakness, visual disturbances. Denies abdominal pain, nausea, vomiting, diarrhea or constipation.    Past Medical History  Diagnosis Date  . MVP (mitral valve prolapse)     echo 02 normal  . Jaundice due to hepatitis     hx when younger unknown cause resolved   . Glucosuria     with normal blood sugar  . Meningioma   . Migraine headache     some aura  period related  imitrex helps  . Colon polyps     2006 Adenomatous polyp  . Seizures   . HLD (hyperlipidemia)   . Hepatitis C aby neg viral copies     serology positive but neg viral copies    Past Surgical History  Procedure Laterality Date  . Appendectomy    . Tubal ligation    . Frontal meningioma resected  1998  . Cin d&c conization  2000  . Bunionectomy      laft  . Left shoulder dislocation surgery  2005   Family History  Problem Relation Age of Onset  . Colon cancer Father   . Colon cancer Paternal Aunt   . Colon cancer      both GMs   . Colon cancer Paternal Uncle   . Diabetes Father     elderly  . Hyperlipidemia Father   . Other Brother     transient global amnesia  . Crohn's disease Daughter   . Coronary artery disease Father 80    MI  .  Coronary artery disease Maternal Uncle 74  . Diabetes Father    History  Substance Use Topics  . Smoking status: Former Smoker    Quit date: 05/22/1990  . Smokeless tobacco: Never Used  . Alcohol Use: No     Comment: occas   OB History   Grav Para Term Preterm Abortions TAB SAB Ect Mult Living                 Review of Systems  Constitutional: Positive for appetite change. Negative for chills.  Cardiovascular: Positive for chest pain and palpitations.  Gastrointestinal: Positive for nausea, abdominal pain and abdominal distention.  All other  systems reviewed and are negative.   Ten systems reviewed and are negative for acute change, except as noted in the HPI.    Allergies  Codeine sulfate and Phenytoin sodium extended  Home Medications   Prior to Admission medications   Medication Sig Start Date End Date Taking? Authorizing Provider  Aspirin-Salicylamide-Caffeine (BC HEADACHE POWDER PO) Take 1 packet by mouth as needed.    Historical Provider, MD  cholecalciferol (VITAMIN D) 1000 UNITS tablet Take 1,000 Units by mouth daily.      Historical Provider, MD  Cyanocobalamin (VITAMIN B 12 PO) Take 1 tablet by mouth daily.      Historical Provider, MD  Estradiol-Norethindrone Acet 0.5-0.1 MG per tablet Take by mouth daily. 03/19/11   Historical Provider, MD  Multiple Vitamin (MULTIVITAMIN PO) Take 1 tablet by mouth daily.      Historical Provider, MD  OMEGA-3 1000 MG CAPS Take 1 capsule by mouth daily.      Historical Provider, MD  pantoprazole (PROTONIX) 40 MG tablet Take 1 tablet (40 mg total) by mouth daily. 11/05/12   Inda Castle, MD  PHENobarbital (LUMINAL) 97.2 MG tablet Take 97.2 mg by mouth at bedtime.     Hosie Spangle, MD  SUMAtriptan (IMITREX) 100 MG tablet Take 1 tablet (100 mg total) by mouth once as needed for migraine (can repeat in 2 hours ). 03/11/12 03/11/13  Burnis Medin, MD   BP 124/75  Pulse 73  Resp 18  Ht 5\' 5"  (1.651 m)  Wt 190 lb (86.183 kg)  BMI 31.62 kg/m2  SpO2 100%  LMP 12/21/2010 Physical Exam  Nursing note and vitals reviewed. Constitutional: She is oriented to person, place, and time. She appears well-developed and well-nourished. No distress.  HENT:  Head: Normocephalic and atraumatic.  Eyes: Conjunctivae are normal. No scleral icterus.  Neck: Normal range of motion.  Cardiovascular: Regular rhythm and normal heart sounds.  Exam reveals no gallop and no friction rub.   No murmur heard. Upper and lower extremity pulses equal and strong. Intermittent bigeminy   Pulmonary/Chest:  Effort normal and breath sounds normal. No respiratory distress.  Abdominal: Soft. Bowel sounds are normal. She exhibits distension. She exhibits no mass. There is tenderness. There is no guarding.  Exquisitely tender to palpation epigastrium and left upper quadrant. No CVA tenderness. No rebound, no guarding  Neurological: She is alert and oriented to person, place, and time.  Skin: Skin is warm and dry. She is not diaphoretic.    ED Course  Procedures (including critical care time) Labs Review Labs Reviewed  CBC WITH DIFFERENTIAL - Abnormal; Notable for the following:    RBC 5.24 (*)    Hemoglobin 15.1 (*)    HCT 46.4 (*)    All other components within normal limits  COMPREHENSIVE METABOLIC PANEL  Randolm Idol, ED  Imaging Review No results found.   EKG Interpretation   Date/Time:  Friday December 05 2013 08:30:33 EDT Ventricular Rate:  77 PR Interval:  124 QRS Duration: 84 QT Interval:  389 QTC Calculation: 440 R Axis:   69 Text Interpretation:  Sinus rhythm Ventricular bigeminy Confirmed by  HARRISON  MD, FORREST ([id number]) on 12/05/2013 8:35:22 AM       MDM   Final diagnoses:  None    9:22 AM BP 124/75  Pulse 73  Resp 18  Ht 5\' 5"  (1.651 m)  Wt 190 lb (86.183 kg)  BMI 31.62 kg/m2  SpO2 100%  LMP 12/21/2010 Patient with abdominal pain/ cp/ back pain. Highly doubt dissection. I feel this is most likely related to a gastritis given her hx.   10:59 AM BP 111/71  Pulse 71  Resp 15  Ht 5\' 5"  (1.651 m)  Wt 190 lb (86.183 kg)  BMI 31.62 kg/m2  SpO2 96%  LMP 12/21/2010 Patient with markedly elevated liver enzymes. In 2011 she was found to have a reactive hepatitis see antibiotic it. Her quantitative score was unreadable. This is either a false negative hep C over she is a previous infection that was cleared. The patient has been having right flank pain for the past 2 weeks and now has a markedly elevated liver enzyme finding. Ultrasound of the abdomen has  been ordered and is pending. Discussed the findings with the patient today.     12:04 PM BP 111/71  Pulse 71  Resp 15  Ht 5\' 5"  (1.651 m)  Wt 190 lb (86.183 kg)  BMI 31.62 kg/m2  SpO2 96%  LMP 12/21/2010 Patient's ultrasound is without acute abnormality. She does have gallstones but there is no sign of acute cholecystitis. She does not have a leukocytosis, afebrile, doubt cholangitis. The patient has established care with [name] it with our gastroenterology. I placed a consult to regarding her transaminitis.   Dr Deatra Ina asks that we obtain an  Viral hep panel.  She will be admitted by internal medicine for further workup including MRCP.  Patient is to remain NPO, She appears stable for admission.   Margarita Mail, PA-C 12/09/13 1510

## 2013-12-05 NOTE — ED Notes (Signed)
Bed: HY86 Expected date:  Expected time:  Means of arrival:  Comments: Patient still in room

## 2013-12-05 NOTE — ED Notes (Signed)
Per pt, having pain in chest radiating down abdomen and through back. States last night with anxiety, felt left arm going numb.  Pain is generalized throughout chest, abdomen and back.  Does not describe one pain as being higher than other.  Pepto with no relief.  Also tried bm.  Last bm yesterday at 6pm - normal.  Nausea, no vomiting.  No diarrhea or fever.

## 2013-12-05 NOTE — H&P (Signed)
[facility] History and Physical  Tricia [name] TIR:[id number] DOB: 27-May-1960 DOA: 12/05/2013  Referring physician:  Margarita Mail PCP:  Lottie Dawson, MD   Chief Complaint:  Abdominal pain  HPI:  The patient is a 53 y.o. year-old female with history of mitral valve prolapse, unknown cause of hepatitis when younger, migraine headaches with aura, meningioma status post resection, colon polyps, seizure disorder, hyperlipidemia, history of hepatitis C antibody test positive with negative viral load who presents with abdominal pain.  The patient was last at their baseline health yesterday.  She states she has felt fine the last few days and denies fevers, chills, nausea, vomiting up until last night. She ate at a [employer]. When she got home, she had sudden onset of 10 out of 10 epigastric pain with radiation to the left and right upper quadrant. The pain started to radiate to her back. She developed nausea and was unable to vomit. Her last bowel movement was this morning and was normal. She denies fevers, chills, shortness of breath, cough. She had some radiation of pain into her chest with associated palpitations, no shortness of breath. She was around a sick colleague he went home a day ago with nausea, vomiting, and diarrhea. No other sick contacts. She has not eaten any new unusual foods except a [place] food from last night. No recent travel. She denies any episodic epigastric or right upper quadrant pain with meals. She has had some weight gain.    In the emergency department, her vital signs were stable. Her labs were notable for hemoglobin of 15.1, alkaline phosphatase 225, total bilirubin 1.6, AST 1000 125, ALT 791, lipase 51. Abdominal ultrasound demonstrated gallstones without acute cholecystitis. Largest gallstone was 1.6 cm. Troponin was negative.  EKG with normal sinus rhythm followed by ventricular bigeminy, no evidence of acute ischemia.  She was given aspirin 324  mg, Pepcid, GI cocktail, 3 mg of IV Dilaudid in several doses of Zofran in the emergency department.  Review of Systems:  General:  Denies fevers, chills, positive weight gain HEENT:  Denies changes to hearing and vision, rhinorrhea, sinus congestion, sore throat CV:  Denies lower extremity edema.  PULM:  Denies SOB, wheezing, cough.   GI:  Per history of present illness  GU:  Denies dysuria, frequency, urgency ENDO:  Denies polyuria, polydipsia.   HEME:  Denies hematemesis, blood in stools, melena, abnormal bruising or bleeding.  LYMPH:  Denies lymphadenopathy.   MSK:  Denies arthralgias, myalgias.   DERM:  Denies skin rash or ulcer.   NEURO:  Denies focal numbness, weakness, slurred speech, confusion, facial droop.  PSYCH:  Denies anxiety and depression.    Past Medical History  Diagnosis Date  . MVP (mitral valve prolapse)     echo 02 normal  . Jaundice due to hepatitis     hx when younger unknown cause resolved   . Glucosuria     with normal blood sugar  . Meningioma   . Migraine headache     some aura  period related  imitrex helps  . Colon polyps     2006 Adenomatous polyp  . Seizures   . HLD (hyperlipidemia)   . Hepatitis C aby neg viral copies     serology positive but neg viral copies    Past Surgical History  Procedure Laterality Date  . Appendectomy    . Tubal ligation    . Frontal meningioma resected  1998  . Cin d&c conization  2000  .  Bunionectomy      laft  . Left shoulder dislocation surgery  2005   Social History:  reports that she quit smoking about 23 years ago. Her smoking use included Cigarettes. She smoked 1.50 packs per day. She has never used smokeless tobacco. She reports that she drinks alcohol. She reports that she does not use illicit drugs.   Allergies  Allergen Reactions  . Codeine Sulfate     REACTION: unspecified  . Phenytoin Sodium Extended Rash    Family History  Problem Relation Age of Onset  . Colon cancer Father   . Colon  cancer Paternal Aunt   . Colon cancer      both GMs   . Colon cancer Paternal Uncle   . Hyperlipidemia Father   . Other Brother     transient global amnesia  . Crohn's disease Daughter   . Coronary artery disease Father 42    MI  . Coronary artery disease Maternal Uncle 74  . Diabetes Father     elderly  . Colon cancer Mother      Prior to Admission medications   Medication Sig Start Date End Date Taking? Authorizing Provider  Aspirin-Salicylamide-Caffeine (BC HEADACHE POWDER PO) Take 1 packet by mouth as needed.   Yes Historical Provider, MD  bismuth subsalicylate (PEPTO BISMOL) 262 MG/15ML suspension Take 30 mLs by mouth every 6 (six) hours as needed for indigestion.   Yes Historical Provider, MD  cholecalciferol (VITAMIN D) 1000 UNITS tablet Take 1,000 Units by mouth daily.     Yes Historical Provider, MD  Cyanocobalamin (VITAMIN B 12 PO) Take 1 tablet by mouth daily.     Yes Historical Provider, MD  Estradiol-Norethindrone Acet 0.5-0.1 MG per tablet Take by mouth daily. 03/19/11  Yes Historical Provider, MD  Multiple Vitamin (MULTIVITAMIN PO) Take 1 tablet by mouth daily.     Yes Historical Provider, MD  OMEGA-3 1000 MG CAPS Take 1 capsule by mouth daily.     Yes Historical Provider, MD  PHENobarbital (LUMINAL) 97.2 MG tablet Take 97.2 mg by mouth at bedtime.    Yes Hosie Spangle, MD  SUMAtriptan (IMITREX) 100 MG tablet Take 1 tablet (100 mg total) by mouth once as needed for migraine (can repeat in 2 hours ). 03/11/12 03/11/13  Burnis Medin, MD   Physical Exam: Filed Vitals:   12/05/13 0840 12/05/13 0941 12/05/13 1041 12/05/13 1311  BP: 124/75 120/61 111/71 123/66  Pulse:   71   TempSrc:      Resp:  18 15 18   Height:      Weight:      SpO2:  98% 96% 96%     General:  WF, no acute distress  Eyes:  PERRL, anicteric, non-injected.  ENT:  Nares clear.  OP clear, non-erythematous without plaques or exudates.  MMM.  Neck:  Supple without TM or JVD.    Lymph:  No  cervical, supraclavicular, or submandibular LAD.  Cardiovascular:  RRR, normal S1, S2, without m/r/g.  2+ pulses, warm extremities  Respiratory:  CTA bilaterally without increased WOB.  Abdomen:  NABS.  Soft, tender to palpation in the epigastrium with some voluntary guarding, no rebound, tender in the right upper quadrant. Pain in the right upper quadrant with palpation in the right lower quadrant.  Skin:  No rashes or focal lesions.  Musculoskeletal:  Normal bulk and tone.  No LE edema.  Psychiatric:  A & O x 4.  Appropriate affect.  Neurologic:  CN 3-12  intact.  5/5 strength.  Sensation intact.  Labs on Admission:  Basic Metabolic Panel:  Recent Labs Lab 12/05/13 0849  NA 138  K 4.2  CL 100  CO2 26  GLUCOSE 113*  BUN 12  CREATININE 0.76  CALCIUM 9.4   Liver Function Tests:  Recent Labs Lab 12/05/13 0849  AST 1125*  ALT 791*  ALKPHOS 225*  BILITOT 1.6*  PROT 7.7  ALBUMIN 4.1    Recent Labs Lab 12/05/13 0848  LIPASE 51   No results found for this basename: AMMONIA,  in the last 168 hours CBC:  Recent Labs Lab 12/05/13 0849  WBC 6.9  NEUTROABS 4.7  HGB 15.1*  HCT 46.4*  MCV 88.5  PLT 281   Cardiac Enzymes: No results found for this basename: CKTOTAL, CKMB, CKMBINDEX, TROPONINI,  in the last 168 hours  BNP (last 3 results) No results found for this basename: PROBNP,  in the last 8760 hours CBG: No results found for this basename: GLUCAP,  in the last 168 hours  Radiological Exams on Admission: US Abdomen Complete  12/05/2013   CLINICAL DATA:  Abdominal and chest pain.  EXAM: ULTRASOUND ABDOMEN COMPLETE  COMPARISON:  None.  FINDINGS: Gallbladder:  Gallstones are identified measuring up to 1.6 cm. There is no pericholecystic fluid or wall thickening. Sonographer reports negative Murphy's sign.  Common bile duct:  Diameter: 0.5 cm  Liver:  No focal lesion identified. Within normal limits in parenchymal echogenicity.  IVC:  No abnormality visualized.   Pancreas:  Visualized portion unremarkable.  Spleen:  Size and appearance within normal limits.  Right Kidney:  Length: 10.3 cm. Echogenicity within normal limits. No mass or hydronephrosis visualized.  Left Kidney:  Length: 10.5 cm. Echogenicity within normal limits. No mass or hydronephrosis visualized.  Abdominal aorta:  No aneurysm visualized.  Other findings:  None.  IMPRESSION: Gallstones without acute cholecystitis. The study is otherwise negative.   Electronically Signed   By: Inge Rise M.D.   On: 12/05/2013 11:37    EKG: Independently reviewed.  Normal sinus rhythm followed by ventricular bigeminy, no acute ischemic changes  Assessment/Plan Active Problems:   Migraine headache   Hyperlipidemia   Esophageal reflux   Transaminitis  ---  Transaminitis with mildly elevated bilirubin and alkaline phosphatase. Gallstone seen on her abdominal ultrasound seem to suggest that she has passed or passing a gallstone.  Differential diagnosis also includes acute hepatitis. -  Hepatitis panel pending -  Appreciate GI assistance -  N.p.o. for MRCP -  Anticipate that she will likely need cholecystectomy with intraoperative cholangiogram versus ERCP by cholecystectomy. This decision will be made based on her MRCP results. -  Continue Zofran with IV pain medication when necessary  GERD, stopped her PPI.  Resume PPI, continue prn maalox  Migraine headaches, stable, continue when necessary sumatriptan  Seizures, stable, continue pentobarbital  Hyperlipidemia, diet-controlled.  History of possible hepatitis C antibody test positive. We will confirm antibody presence with testing today.  Diet:  N.p.o. Access:  PIV IVF:  Yes Proph:  Heparin  Code Status: Full Family Communication: Patient and her mother Disposition Plan: Admit to medical surgical  Time spent: 60 min Janece Canterbury [facility] Pager [phone]  If 7PM-7AM, please contact  night-coverage [email or web address] Password [email or web address] 12/05/2013, 1:58 PM

## 2013-12-05 NOTE — Consult Note (Signed)
Referring Provider: No ref. provider found Primary Care Physician:  Lottie Dawson, MD Primary Gastroenterologist:  [name]  Reason for Consultation:  Right sided pain and transaminitis  HPI: Tricia [name] is a 53 y.o. female with PMH of frequent headaches/migraines, GERD, and history of PUD presents to [facility] hospital with complaints of epigastric abdominal pain that began after eating dinner last evening.  She says that she ate dinner around 6 pm and around 9 pm she had sudden onset of epigastric abdominal pain. She thought maybe it was indigestion but had not really had discomfort so abruptly or severe in the past.  In the ED she was found to have transaminitis with AST 1125, ALT 791, ALP 225, and total bili 1.6.  LFT's were completely normal 4 months ago.  Ultrasound shows gallstones but no ductal dilation.  She has history of positive hepatitis C Ab but viral load was negative.  Hepatitis panel is pending currently.  She admits to frequent headaches/migraines.  Takes BC powders almost daily, but no more than 2 per day.  She denies taking any additional tylenol or absolutely any other OTC medications.  Lipase is normal.  No leukocytosis.  She is known to [name] for EGD in 03/2011 at which time she had a stricture at the [place] junction that was dilated with [facility] dilator and multiple ulcers in antrum of her stomach; biopsies showed slight inflammation and edema but no Hpylori.  Colonoscopy 01/2010 showed two small polyps, one hyperplastic and one TA; repeat recommended in 5 years from that time.   Past Medical History  Diagnosis Date  . MVP (mitral valve prolapse)     echo 02 normal  . Jaundice due to hepatitis     hx when younger unknown cause resolved   . Glucosuria     with normal blood sugar  . Meningioma   . Migraine headache     some aura  period related  imitrex helps  . Colon polyps     2006 Adenomatous polyp  . Seizures   . HLD (hyperlipidemia)   . Hepatitis C aby  neg viral copies     serology positive but neg viral copies     Past Surgical History  Procedure Laterality Date  . Appendectomy    . Tubal ligation    . Frontal meningioma resected  1998  . Cin d&c conization  2000  . Bunionectomy      laft  . Left shoulder dislocation surgery  2005    Prior to Admission medications   Medication Sig Start Date End Date Taking? Authorizing Provider  Aspirin-Salicylamide-Caffeine (BC HEADACHE POWDER PO) Take 1 packet by mouth as needed.   Yes Historical Provider, MD  bismuth subsalicylate (PEPTO BISMOL) 262 MG/15ML suspension Take 30 mLs by mouth every 6 (six) hours as needed for indigestion.   Yes Historical Provider, MD  cholecalciferol (VITAMIN D) 1000 UNITS tablet Take 1,000 Units by mouth daily.     Yes Historical Provider, MD  Cyanocobalamin (VITAMIN B 12 PO) Take 1 tablet by mouth daily.     Yes Historical Provider, MD  Estradiol-Norethindrone Acet 0.5-0.1 MG per tablet Take by mouth daily. 03/19/11  Yes Historical Provider, MD  Multiple Vitamin (MULTIVITAMIN PO) Take 1 tablet by mouth daily.     Yes Historical Provider, MD  OMEGA-3 1000 MG CAPS Take 1 capsule by mouth daily.     Yes Historical Provider, MD  pantoprazole (PROTONIX) 40 MG tablet Take 1 tablet (40 mg  total) by mouth daily. 11/05/12  Yes Inda Castle, MD  PHENobarbital (LUMINAL) 97.2 MG tablet Take 97.2 mg by mouth at bedtime.    Yes Hosie Spangle, MD  SUMAtriptan (IMITREX) 100 MG tablet Take 1 tablet (100 mg total) by mouth once as needed for migraine (can repeat in 2 hours ). 03/11/12 03/11/13  Burnis Medin, MD    No current facility-administered medications for this encounter.   Current Outpatient Prescriptions  Medication Sig Dispense Refill  . Aspirin-Salicylamide-Caffeine (BC HEADACHE POWDER PO) Take 1 packet by mouth as needed.      . bismuth subsalicylate (PEPTO BISMOL) 262 MG/15ML suspension Take 30 mLs by mouth every 6 (six) hours as needed for indigestion.        . cholecalciferol (VITAMIN D) 1000 UNITS tablet Take 1,000 Units by mouth daily.        . Cyanocobalamin (VITAMIN B 12 PO) Take 1 tablet by mouth daily.        . Estradiol-Norethindrone Acet 0.5-0.1 MG per tablet Take by mouth daily.      . Multiple Vitamin (MULTIVITAMIN PO) Take 1 tablet by mouth daily.        . OMEGA-3 1000 MG CAPS Take 1 capsule by mouth daily.        . pantoprazole (PROTONIX) 40 MG tablet Take 1 tablet (40 mg total) by mouth daily.  90 tablet  3  . PHENobarbital (LUMINAL) 97.2 MG tablet Take 97.2 mg by mouth at bedtime.       . SUMAtriptan (IMITREX) 100 MG tablet Take 1 tablet (100 mg total) by mouth once as needed for migraine (can repeat in 2 hours ).  9 tablet  3    Allergies as of 12/05/2013 - Review Complete 12/05/2013  Allergen Reaction Noted  . Codeine sulfate    . Phenytoin sodium extended Rash 02/08/2011    Family History  Problem Relation Age of Onset  . Colon cancer Father   . Colon cancer Paternal Aunt   . Colon cancer      both GMs   . Colon cancer Paternal Uncle   . Diabetes Father     elderly  . Hyperlipidemia Father   . Other Brother     transient global amnesia  . Crohn's disease Daughter   . Coronary artery disease Father 73    MI  . Coronary artery disease Maternal Uncle 74  . Diabetes Father     History   Social History  . Marital Status: Legally Separated    Spouse Name: N/A    Number of Children: 3  . Years of Education: N/A   Occupational History  . accounting [employer]   Social History Main Topics  . Smoking status: Former Smoker    Quit date: 05/22/1990  . Smokeless tobacco: Never Used  . Alcohol Use: No     Comment: occas  . Drug Use: No  . Sexual Activity: Not on file   Other Topics Concern  . Not on file   Social History Narrative   HH of 1 kid out to college    1 dog   Sleep 5-7 hours   Works [employer] fund[employer] now Set designer    vits omega 3 b complex ca vit    Review of  Systems: Ten point ROS is O/W negative except as mentioned in HPI.  Physical Exam: Vital signs in last 24 hours: Pulse Rate:  [71-73] 71 (07/17 1041) Resp:  [15-18] 15 (07/17  1041) BP: (111-124)/(61-75) 111/71 mmHg (07/17 1041) SpO2:  [96 %-100 %] 96 % (07/17 1041) Weight:  [190 lb (86.183 kg)] 190 lb (86.183 kg) (07/17 2595)   General:  Alert, Well-developed, well-nourished, pleasant and cooperative in NAD Head:  Normocephalic and atraumatic. Eyes:  Sclera clear, no icterus.  Conjunctiva pink. Ears:  Normal auditory acuity. Mouth:  No deformity or lesions.   Lungs:  Clear throughout to auscultation.  No wheezes, crackles, or rhonchi.  Heart:  Regular rate and rhythm; no murmurs, clicks, rubs, or gallops. Abdomen:  Soft, non-distended.  BS present.  Mild epigastric TTP without R/R/G.  Rectal:  Deferred  Msk:  Symmetrical without gross deformities. Pulses:  Normal pulses noted. Extremities:  Without clubbing or edema. Neurologic:  Alert and  oriented x4;  grossly normal neurologically. Skin:  Intact without significant lesions or rashes. Psych:  Alert and cooperative. Normal mood and affect.  Lab Results:  Recent Labs  12/05/13 0849  WBC 6.9  HGB 15.1*  HCT 46.4*  PLT 281   BMET  Recent Labs  12/05/13 0849  NA 138  K 4.2  CL 100  CO2 26  GLUCOSE 113*  BUN 12  CREATININE 0.76  CALCIUM 9.4   LFT  Recent Labs  12/05/13 0849  PROT 7.7  ALBUMIN 4.1  AST 1125*  ALT 791*  ALKPHOS 225*  BILITOT 1.6*   Studies/Results: US Abdomen Complete  12/05/2013   CLINICAL DATA:  Abdominal and chest pain.  EXAM: ULTRASOUND ABDOMEN COMPLETE  COMPARISON:  None.  FINDINGS: Gallbladder:  Gallstones are identified measuring up to 1.6 cm. There is no pericholecystic fluid or wall thickening. Sonographer reports negative Murphy's sign.  Common bile duct:  Diameter: 0.5 cm  Liver:  No focal lesion identified. Within normal limits in parenchymal echogenicity.  IVC:  No abnormality  visualized.  Pancreas:  Visualized portion unremarkable.  Spleen:  Size and appearance within normal limits.  Right Kidney:  Length: 10.3 cm. Echogenicity within normal limits. No mass or hydronephrosis visualized.  Left Kidney:  Length: 10.5 cm. Echogenicity within normal limits. No mass or hydronephrosis visualized.  Abdominal aorta:  No aneurysm visualized.  Other findings:  None.  IMPRESSION: Gallstones without acute cholecystitis. The study is otherwise negative.   Electronically Signed   By: Inge Rise M.D.   On: 12/05/2013 11:37    IMPRESSION:  -Newly elevated LFT's with sudden onset of epigastric to right sided abdominal pain last evening.  Gallstones on ultrasound but no CBD dilation or definite CBD stones.  Her story sounds gallstone related but LFT elevation not typical for this. -Frequent headaches with frequent use of BC's, no more than 2 per day. -History of positive Hep C Ab but negative viral load  PLAN: -MRI abdomen/MRCP to rule out biliary ductal dilation and/or retained CBD stone. -Viral hepatitis panel pending. -Repeat LFT's in AM.  Check PT/INR.   ZEHR, JESSICA D.  12/05/2013, 12:30 PM  Pager number [phone]  GI Attending Note   Chart was reviewed , X-rays and lab were reviewed.  Patient is currently undergoing MRI so I could not examine her.  Sudden onset of upper abdominal pain and abnormal LFTs raise the question of choledocholithiasis but no stones were seen by ultrasound (this is not unusual).  In addition, there was no bile duct dilatation as might be expected.  His release raises the question of acute hepatic injury causing pain and LFT abnormalities such as can be seen with toxic ingestion, less likely with acute  hepatitis.  Plan to obtain an MRI/MRCP as the next test.    Sandy Salaam. Deatra Ina, M.D., [facility] Cell [phone]

## 2013-12-06 DIAGNOSIS — Z8619 Personal history of other infectious and parasitic diseases: Secondary | ICD-10-CM

## 2013-12-06 DIAGNOSIS — G40909 Epilepsy, unspecified, not intractable, without status epilepticus: Secondary | ICD-10-CM | POA: Diagnosis present

## 2013-12-06 DIAGNOSIS — R109 Unspecified abdominal pain: Secondary | ICD-10-CM

## 2013-12-06 DIAGNOSIS — K8050 Calculus of bile duct without cholangitis or cholecystitis without obstruction: Secondary | ICD-10-CM | POA: Diagnosis present

## 2013-12-06 DIAGNOSIS — K8020 Calculus of gallbladder without cholecystitis without obstruction: Principal | ICD-10-CM

## 2013-12-06 DIAGNOSIS — G43009 Migraine without aura, not intractable, without status migrainosus: Secondary | ICD-10-CM

## 2013-12-06 LAB — COMPREHENSIVE METABOLIC PANEL
ALT: 551 U/L — ABNORMAL HIGH (ref 0–35)
ANION GAP: 11 (ref 5–15)
AST: 340 U/L — ABNORMAL HIGH (ref 0–37)
Albumin: 3.4 g/dL — ABNORMAL LOW (ref 3.5–5.2)
Alkaline Phosphatase: 219 U/L — ABNORMAL HIGH (ref 39–117)
BILIRUBIN TOTAL: 0.9 mg/dL (ref 0.3–1.2)
BUN: 9 mg/dL (ref 6–23)
CHLORIDE: 102 meq/L (ref 96–112)
CO2: 25 mEq/L (ref 19–32)
CREATININE: 0.67 mg/dL (ref 0.50–1.10)
Calcium: 8.9 mg/dL (ref 8.4–10.5)
GFR calc Af Amer: >90 mL/min (ref >90)
GLUCOSE: 88 mg/dL (ref 70–99)
Potassium: 4.2 mEq/L (ref 3.7–5.3)
Sodium: 138 mEq/L (ref 137–147)
Total Protein: 6.5 g/dL (ref 6.0–8.3)

## 2013-12-06 LAB — CBC
HCT: 46.7 % — ABNORMAL HIGH (ref 36.0–46.0)
Hemoglobin: 14.7 g/dL (ref 12.0–15.0)
MCH: 28.8 pg (ref 26.0–34.0)
MCHC: 31.5 g/dL (ref 30.0–36.0)
MCV: 91.6 fL (ref 78.0–100.0)
PLATELETS: 237 10*3/uL (ref 150–400)
RBC: 5.1 MIL/uL (ref 3.87–5.11)
RDW: 12.7 % (ref 11.5–15.5)
WBC: 6.1 10*3/uL (ref 4.0–10.5)

## 2013-12-06 MED ORDER — KETOROLAC TROMETHAMINE 30 MG/ML IJ SOLN
30.0000 mg | Freq: Once | INTRAMUSCULAR | Status: AC
  Administered 2013-12-06: 30 mg via INTRAVENOUS
  Filled 2013-12-06: qty 1

## 2013-12-06 MED ORDER — KETOROLAC TROMETHAMINE 30 MG/ML IJ SOLN
30.0000 mg | Freq: Once | INTRAMUSCULAR | Status: AC
  Administered 2013-12-06: 30 mg via INTRAVENOUS
  Filled 2013-12-06: qty 2
  Filled 2013-12-06 (×2): qty 1

## 2013-12-06 NOTE — Progress Notes (Signed)
[facility] Progress Note  Subjective:  Still has abdominal pain.  Put on a regular diet last night but tried to eat and had vomiting.    Objective:  Vital signs in last 24 hours: Temp:  [97.5 F (36.4 C)-98 F (36.7 C)] 98 F (36.7 C) (07/18 0509) Pulse Rate:  [59-85] 59 (07/18 0509) Resp:  [15-20] 18 (07/18 0509) BP: (101-124)/(61-92) 101/65 mmHg (07/18 0509) SpO2:  [94 %-100 %] 99 % (07/18 0509) Weight:  [190 lb (86.183 kg)] 190 lb (86.183 kg) (07/17 1410) Last BM Date: 12/04/13 General:  Alert, Well-developed, in NAD Heart:  Regular rate and rhythm; no murmurs Pulm:  CTAB.  No W/R/R. Abdomen:  Soft, non-distended. Normal bowel sounds.  Mild epigastric and RUQ TTP without R/R/G. Extremities:  Without edema. Neurologic:  Alert and  oriented x4;  grossly normal neurologically. Psych:  Alert and cooperative. Normal mood and affect.  Intake/Output from previous day: 07/17 0701 - 07/18 0700 In: 1701.3 [P.O.:540; I.V.:1161.3] Out: -   Lab Results:  Recent Labs  12/05/13 0849 12/06/13 0500  WBC 6.9 6.1  HGB 15.1* 14.7  HCT 46.4* 46.7*  PLT 281 237   BMET  Recent Labs  12/05/13 0849 12/06/13 0500  NA 138 138  K 4.2 4.2  CL 100 102  CO2 26 25  GLUCOSE 113* 88  BUN 12 9  CREATININE 0.76 0.67  CALCIUM 9.4 8.9   LFT  Recent Labs  12/06/13 0500  PROT 6.5  ALBUMIN 3.4*  AST 340*  ALT 551*  ALKPHOS 219*  BILITOT 0.9   PT/INR  Recent Labs  12/05/13 1230  LABPROT 12.3  INR 0.91   Hepatitis Panel  Recent Labs  12/05/13 1230  HEPBSAG NEGATIVE  HCVAB Reactive*  HEPAIGM NON REACTIVE  HEPBIGM NON REACTIVE    US Abdomen Complete  12/05/2013   CLINICAL DATA:  Abdominal and chest pain.  EXAM: ULTRASOUND ABDOMEN COMPLETE  COMPARISON:  None.  FINDINGS: Gallbladder:  Gallstones are identified measuring up to 1.6 cm. There is no pericholecystic fluid or wall thickening. Sonographer reports negative Murphy's sign.  Common bile duct:   Diameter: 0.5 cm  Liver:  No focal lesion identified. Within normal limits in parenchymal echogenicity.  IVC:  No abnormality visualized.  Pancreas:  Visualized portion unremarkable.  Spleen:  Size and appearance within normal limits.  Right Kidney:  Length: 10.3 cm. Echogenicity within normal limits. No mass or hydronephrosis visualized.  Left Kidney:  Length: 10.5 cm. Echogenicity within normal limits. No mass or hydronephrosis visualized.  Abdominal aorta:  No aneurysm visualized.  Other findings:  None.  IMPRESSION: Gallstones without acute cholecystitis. The study is otherwise negative.   Electronically Signed   By: Inge Rise M.D.   On: 12/05/2013 11:37   Mr 3d Recon At Scanner  12/05/2013   CLINICAL DATA:  Right-sided abdominal pain. Elevated liver function tests. Cholelithiasis.  EXAM: MRI ABDOMEN WITHOUT AND WITH CONTRAST (INCLUDING MRCP)  TECHNIQUE: Multiplanar multisequence MR imaging of the abdomen was performed both before and after the administration of intravenous contrast. Heavily T2-weighted images of the biliary and pancreatic ducts were obtained, and three-dimensional MRCP images were rendered by post processing.  CONTRAST:  84mL MULTIHANCE GADOBENATE DIMEGLUMINE 529 MG/ML IV SOLN  COMPARISON:  Abdomen ultrasound on 12/05/2013  FINDINGS: Cholelithiasis is demonstrated, however there is no evidence of acute cholecystitis. MRCP images are degraded by motion artifact, however there is no definite evidence of biliary ductal dilatation, with common bile duct  measuring approximately 5-6 mm. There is no evidence of pancreatic ductal dilatation.  The liver, pancreas, spleen, adrenal glands, and kidneys are normal in appearance. No soft tissue masses or lymphadenopathy identified. No evidence of inflammatory process or abnormal fluid collections. No evidence of dilated bowel loops.  IMPRESSION: Cholelithiasis, without radiographic evidence of cholecystitis or other acute findings.  MRCP images are  degraded by motion artifact, however there is no evidence of biliary or pancreatic ductal dilatation.   Electronically Signed   By: Earle Gell M.D.   On: 12/05/2013 18:07   Mr Abd W/wo Cm/mrcp  12/05/2013   CLINICAL DATA:  Right-sided abdominal pain. Elevated liver function tests. Cholelithiasis.  EXAM: MRI ABDOMEN WITHOUT AND WITH CONTRAST (INCLUDING MRCP)  TECHNIQUE: Multiplanar multisequence MR imaging of the abdomen was performed both before and after the administration of intravenous contrast. Heavily T2-weighted images of the biliary and pancreatic ducts were obtained, and three-dimensional MRCP images were rendered by post processing.  CONTRAST:  71mL MULTIHANCE GADOBENATE DIMEGLUMINE 529 MG/ML IV SOLN  COMPARISON:  Abdomen ultrasound on 12/05/2013  FINDINGS: Cholelithiasis is demonstrated, however there is no evidence of acute cholecystitis. MRCP images are degraded by motion artifact, however there is no definite evidence of biliary ductal dilatation, with common bile duct measuring approximately 5-6 mm. There is no evidence of pancreatic ductal dilatation.  The liver, pancreas, spleen, adrenal glands, and kidneys are normal in appearance. No soft tissue masses or lymphadenopathy identified. No evidence of inflammatory process or abnormal fluid collections. No evidence of dilated bowel loops.  IMPRESSION: Cholelithiasis, without radiographic evidence of cholecystitis or other acute findings.  MRCP images are degraded by motion artifact, however there is no evidence of biliary or pancreatic ductal dilatation.   Electronically Signed   By: Earle Gell M.D.   On: 12/05/2013 18:07    Assessment / Plan: -Newly elevated LFT's with sudden onset of epigastric to right sided abdominal pain last evening. Gallstones on ultrasound but no CBD dilation or definite CBD stones; MRI/MRCP also negative for CBD stones/dilitation.  LFT's trending down.  ? If she passed a stone vs acute hepatitis of some  sort. -Frequent headaches with frequent use of BC's, no more than 2 per day.  -Positive Hep C Ab but negative viral load in the past.   *Will check EBV and CMV. *Recheck LFT's in the morning and if continuing to trend down then could discharge her home. *? If surgical consult would be appropriate inpatient vs outpatient for possible cholecystectomy.   LOS: 1 day   ZEHR, JESSICA D.  12/06/2013, 8:28 AM  Pager number [phone]  GI Attending Note  I have personally taken an interval history, reviewed the chart, and examined the patient. Clinical picture suggests biliary tract disease but there's no radiologic evidence for bile duct obstruction to account for her LFT elevation nor evidence for acute cholecystitis.  Plan as above.  In addition, will get HIDA scan   Sandy Salaam. Deatra Ina, MD, [facility] [phone]

## 2013-12-06 NOTE — Progress Notes (Signed)
Progress Note   Tricia [name]:[id number] DOB: 1960-10-08 DOA: 12-12-2013 PCP: Lottie Dawson, MD   Brief Narrative:   Tricia [name] is an 53 y.o. female with a PMH of mitral valve prolapse, unknown cause of hepatitis when younger, migraine headaches with aura, meningioma status post resection, colon polyps, seizure disorder, hyperlipidemia, history of hepatitis C antibody test positive with negative viral load who was admitted 12-12-2013 with a chief complaint of abdominal pain who was found to have transaminitis on initial presentation. Abdominal ultrasound revealed gallstones without acute cholecystitis.   Assessment/Plan:   Principal Problem:   Biliary colic / transaminitis / gallstones  S/P MRCP and abdominal U/S which confirm cholelithiasis without cholecystitis.  Lipase WNL.  LFTs improving, suggesting she may have passed a stone.   GI on board.  Ordered HIDA scan.  Active Problems:   Seizure disorder  Continue phenobarbital.    Migraine headache  Continue PRN sumatriptan.    Hyperlipidemia  Diet controlled.     Esophageal reflux  Continue PPI and PRN Maalox.    History of hepatitis C  Serologies + hepatitis C.  Hep B/A negative.  Protime WNL.    DVT Prophylaxis  Continue heparin.  Code Status: Full. Family Communication: No family at bedside. Disposition Plan: Home when stable.   IV Access:    Peripheral IV   Procedures and diagnostic studies:    Abdominal ultrasound 12-12-13: Gallstones without acute cholecystitis. Study otherwise negative.  MRCP 12-Dec-2013: Cholelithiasis without radiographic evidence of cholecystitis or other acute findings. No evidence of biliary or pancreatic ductal dilatation.   Medical Consultants:    [name], GI.   Other Consultants:    None.   Anti-Infectives:    None.  Subjective:   [name] has a migraine headache and continues to have some abdominal pain although  the abdominal pain is some better than it was yesterday. She has nausea but has not vomited. Bowels moved normally yesterday.  Objective:    Filed Vitals:   December 12, 2013 1311 12-Dec-2013 1410 12/12/13 2045 12/06/13 0509  BP: 123/66 112/65 109/92 101/65  Pulse:  61 85 59  Temp:  97.5 F (36.4 C) 97.6 F (36.4 C) 98 F (36.7 C)  TempSrc:  Oral Oral Oral  Resp: 18 18 20 18   Height:  5\' 5"  (1.651 m)    Weight:  86.183 kg (190 lb)    SpO2: 96% 94% 98% 99%    Intake/Output Summary (Last 24 hours) at 12/06/13 0804 Last data filed at 12/06/13 0600  Gross per 24 hour  Intake 1701.25 ml  Output      0 ml  Net 1701.25 ml    Exam: Gen: Uncomfortable appearing Cardiovascular:  RRR, No M/R/G Respiratory:  Lungs CTAB Gastrointestinal:  Abdomen soft, tender in the midepigastric area and right upper quadrant. No rebound/guarding Extremities:  No C/E/C   Data Reviewed:    Labs: Basic Metabolic Panel:  Recent Labs Lab 12/12/2013 0849 12/06/13 0500  NA 138 138  K 4.2 4.2  CL 100 102  CO2 26 25  GLUCOSE 113* 88  BUN 12 9  CREATININE 0.76 0.67  CALCIUM 9.4 8.9   GFR Estimated Creatinine Clearance: 86.2 ml/min (by C-G formula based on Cr of 0.67). Liver Function Tests:  Recent Labs Lab 12-12-13 0849 12/06/13 0500  AST 1125* 340*  ALT 791* 551*  ALKPHOS 225* 219*  BILITOT 1.6* 0.9  PROT 7.7 6.5  ALBUMIN 4.1 3.4*  Recent Labs Lab 12/05/13 0848  LIPASE 51   Coagulation profile  Recent Labs Lab 12/05/13 1230  INR 0.91    CBC:  Recent Labs Lab 12/05/13 0849 12/06/13 0500  WBC 6.9 6.1  NEUTROABS 4.7  --   HGB 15.1* 14.7  HCT 46.4* 46.7*  MCV 88.5 91.6  PLT 281 237   Microbiology No results found for this or any previous visit (from the past 240 hour(s)).   Medications:   . cholecalciferol  1,000 Units Oral Daily  . docusate sodium  100 mg Oral BID  . Estradiol-Norethindrone Acet  1 tablet Oral Daily  . heparin  5,000 Units Subcutaneous 3 times per  day  . multivitamin with minerals  1 tablet Oral Daily  . pantoprazole (PROTONIX) IV  40 mg Intravenous Q12H  . PHENobarbital  97.2 mg Oral QHS  . vitamin B-12  1,000 mcg Oral Daily   Continuous Infusions: . sodium chloride 75 mL/hr at 12/06/13 0518    Time spent: 35 minutes with > 50% of time discussing current diagnostic test results, clinical impression and plan of care.    LOS: 1 day   RAMA,CHRISTINA  [facility] Pager [phone]. If unable to reach me by pager, please call my cell phone at [phone].  *Please refer to [email or web address], password [email or web address] to get updated schedule on who will round on this patient, as hospitalists switch teams weekly. If 7PM-7AM, please contact night-coverage at [email or web address], password [email or web address] for any overnight needs.  12/06/2013, 8:04 AM    **Disclaimer: This note was dictated with voice recognition software. Similar sounding words can inadvertently be transcribed and this note may contain transcription errors which may not have been corrected upon publication of note.**  Information printed out and reviewed with the patient/family:     In an effort to keep you and your family informed about your hospital stay, I am providing you with this information sheet. If you or your family have any questions, please do not hesitate to have the nursing staff page me to set up a meeting time.  Also note that the hospitalist doctors typically change on Tuesdays or Wednesdays to a different hospitalist doctor.  Tricia [name] 12/06/2013 1 (Number of days in the hospital)  Treatment team:  [name], Hospitalist (Internist)  [name], GI specialist  Pertinent labs / studies:  Abdominal ultrasound 12/05/13: Gallstones without acute cholecystitis. Study otherwise negative.  MRCP 12/05/13: Cholelithiasis without radiographic evidence of cholecystitis or other acute findings. No evidence of biliary or pancreatic ductal dilatation.   Principle  Diagnosis:  Elevated liver enzymes, gallstones   Plan for today: [name] has ordered a HIDA scan.  A hepatobiliary (HIDA) scan is an imaging procedure used to diagnose problems in the liver, gallbladder and bile ducts.In the HIDA scan, a radioactive chemical or tracer is injected into a vein in your arm. The tracer is handled by the liver like bile. Bile is a fluid produced and excreted by your liver that helps your digestive system break down fats in the foods you eat. Bile is stored in your gallbladder and the gallbladder releases the bile when you eat a meal. A special nuclear medicine scanner (gamma camera) tracks the flow of the tracer from your liver into your gallbladder and small intestine.The name HIDA comes from an early tracer used for the scan, hydroxy iminodiacetic acid. More effective tracers are used today.Cholescintigraphy, hepatobiliary scintigraphy are other names for a HIDA scan.  Anticipated discharge date: Depends on  test results.

## 2013-12-07 ENCOUNTER — Inpatient Hospital Stay (HOSPITAL_COMMUNITY): Payer: Managed Care, Other (non HMO)

## 2013-12-07 LAB — COMPREHENSIVE METABOLIC PANEL
ALT: 321 U/L — ABNORMAL HIGH (ref 0–35)
AST: 105 U/L — AB (ref 0–37)
Albumin: 3 g/dL — ABNORMAL LOW (ref 3.5–5.2)
Alkaline Phosphatase: 178 U/L — ABNORMAL HIGH (ref 39–117)
Anion gap: 9 (ref 5–15)
BUN: 10 mg/dL (ref 6–23)
CALCIUM: 8.5 mg/dL (ref 8.4–10.5)
CO2: 26 meq/L (ref 19–32)
CREATININE: 0.76 mg/dL (ref 0.50–1.10)
Chloride: 106 mEq/L (ref 96–112)
Glucose, Bld: 80 mg/dL (ref 70–99)
Potassium: 4.1 mEq/L (ref 3.7–5.3)
Sodium: 141 mEq/L (ref 137–147)
TOTAL PROTEIN: 5.7 g/dL — AB (ref 6.0–8.3)
Total Bilirubin: 0.4 mg/dL (ref 0.3–1.2)

## 2013-12-07 LAB — CBC
HCT: 39.2 % (ref 36.0–46.0)
HEMOGLOBIN: 12.1 g/dL (ref 12.0–15.0)
MCH: 28.5 pg (ref 26.0–34.0)
MCHC: 30.9 g/dL (ref 30.0–36.0)
MCV: 92.5 fL (ref 78.0–100.0)
Platelets: 193 10*3/uL (ref 150–400)
RBC: 4.24 MIL/uL (ref 3.87–5.11)
RDW: 12.9 % (ref 11.5–15.5)
WBC: 4.8 10*3/uL (ref 4.0–10.5)

## 2013-12-07 LAB — AMYLASE: Amylase: 50 U/L (ref 0–105)

## 2013-12-07 LAB — LIPASE, BLOOD: Lipase: 24 U/L (ref 11–59)

## 2013-12-07 MED ORDER — TECHNETIUM TC 99M MEBROFENIN IV KIT: 5.1000 | PACK | Freq: Once | INTRAVENOUS | Status: DC | PRN

## 2013-12-07 MED ORDER — ONDANSETRON HCL 4 MG PO TABS: 4.0000 mg | ORAL_TABLET | Freq: Four times a day (QID) | ORAL | Status: DC | PRN

## 2013-12-07 NOTE — Progress Notes (Signed)
Progress Note   Tricia [name] IEP:[id number] DOB: [birth year] DOA: 2013-12-25 PCP: Lottie Dawson, MD   Brief Narrative:   Tricia [name] is an 53 y.o. female with a PMH of mitral valve prolapse, unknown cause of hepatitis when younger, migraine headaches with aura, meningioma status post resection, colon polyps, seizure disorder, hyperlipidemia, history of hepatitis C antibody test positive with negative viral load who was admitted 25-Dec-2013 with a chief complaint of abdominal pain who was found to have transaminitis on initial presentation. Abdominal ultrasound revealed gallstones without acute cholecystitis.   Assessment/Plan:   Principal Problem:   ? Biliary colic / transaminitis / gallstones  S/P MRCP and abdominal U/S which confirm cholelithiasis without cholecystitis.  Lipase WNL.  LFTs improving, suggesting she may have passed a stone.   GI on board.  HIDA scan shows hepatobiliary dysfunction. Followup cytomegalovirus and Epstein-Barr virus studies.  Active Problems:   Seizure disorder  Continue phenobarbital.    Migraine headache  Continue PRN sumatriptan.    Hyperlipidemia  Diet controlled.     Esophageal reflux  Continue PPI and PRN Maalox.    History of hepatitis C  Serologies + hepatitis C.  Hep B/A negative.  Check quantitative hepatitis C RNA.  Protime WNL.    DVT Prophylaxis  Continue heparin.  Code Status: Full. Family Communication: No family at bedside. Disposition Plan: Home when stable.   IV Access:    Peripheral IV   Procedures and diagnostic studies:    Abdominal ultrasound 2013-12-25: Gallstones without acute cholecystitis. Study otherwise negative.  MRCP 25-Dec-2013: Cholelithiasis without radiographic evidence of cholecystitis or other acute findings. No evidence of biliary or pancreatic ductal dilatation.  NM Hepatobiliary scan: No evidence of cystic duct or biliary obstruction. Increased urinary excretion of  radiopharmaceutical activity is seen, indicating hepatocellular dysfunction.  Medical Consultants:    [name], GI.   Other Consultants:    None.   Anti-Infectives:    None.  Subjective:   Tricia [name] denies abdominal pain, nausea or vomiting.  Objective:    Filed Vitals:   12/06/13 0509 12/06/13 1350 12/06/13 2052 12/07/13 0529  BP: 101/65 111/67 113/46 127/66  Pulse: 59 69 70 61  Temp: 98 F (36.7 C) 98.3 F (36.8 C) 97.6 F (36.4 C) 98.2 F (36.8 C)  TempSrc: Oral Oral Oral Oral  Resp: 18 16 16 16   Height:      Weight:      SpO2: 99% 99% 94% 97%    Intake/Output Summary (Last 24 hours) at 12/07/13 0810 Last data filed at 12/07/13 0600  Gross per 24 hour  Intake   2920 ml  Output      0 ml  Net   2920 ml    Exam: Gen: NAD Cardiovascular:  RRR, No M/R/G Respiratory:  Lungs CTAB Gastrointestinal:  Abdomen soft, tender in the midepigastric area and right upper quadrant. No rebound/guarding Extremities:  No C/E/C   Data Reviewed:    Labs: Basic Metabolic Panel:  Recent Labs Lab 2013/12/25 0849 12/06/13 0500 12/07/13 0431  NA 138 138 141  K 4.2 4.2 4.1  CL 100 102 106  CO2 26 25 26   GLUCOSE 113* 88 80  BUN 12 9 10   CREATININE 0.76 0.67 0.76  CALCIUM 9.4 8.9 8.5   GFR Estimated Creatinine Clearance: 86.2 ml/min (by C-G formula based on Cr of 0.76). Liver Function Tests:  Recent Labs Lab 12/25/2013 0849 12/06/13 0500 12/07/13 0431  AST 1125* 340* 105*  ALT 791* 551* 321*  ALKPHOS 225* 219* 178*  BILITOT 1.6* 0.9 0.4  PROT 7.7 6.5 5.7*  ALBUMIN 4.1 3.4* 3.0*    Recent Labs Lab 12/05/13 0848  LIPASE 51   Coagulation profile  Recent Labs Lab 12/05/13 1230  INR 0.91    CBC:  Recent Labs Lab 12/05/13 0849 12/06/13 0500 12/07/13 0431  WBC 6.9 6.1 4.8  NEUTROABS 4.7  --   --   HGB 15.1* 14.7 12.1  HCT 46.4* 46.7* 39.2  MCV 88.5 91.6 92.5  PLT 281 237 193   Microbiology No results found for this or  any previous visit (from the past 240 hour(s)).   Medications:   . cholecalciferol  1,000 Units Oral Daily  . docusate sodium  100 mg Oral BID  . Estradiol-Norethindrone Acet  1 tablet Oral Daily  . heparin  5,000 Units Subcutaneous 3 times per day  . multivitamin with minerals  1 tablet Oral Daily  . pantoprazole (PROTONIX) IV  40 mg Intravenous Q12H  . PHENobarbital  97.2 mg Oral QHS  . vitamin B-12  1,000 mcg Oral Daily   Continuous Infusions: . sodium chloride 75 mL/hr at 12/06/13 1733    Time spent: 25 minutes.    LOS: 2 days   RAMA,CHRISTINA  [facility] Pager [phone]. If unable to reach me by pager, please call my cell phone at [phone].  *Please refer to [email or web address], password [email or web address] to get updated schedule on who will round on this patient, as hospitalists switch teams weekly. If 7PM-7AM, please contact night-coverage at [email or web address], password [email or web address] for any overnight needs.  12/07/2013, 8:10 AM    **Disclaimer: This note was dictated with voice recognition software. Similar sounding words can inadvertently be transcribed and this note may contain transcription errors which may not have been corrected upon publication of note.**  Information printed out and reviewed with the patient/family:     In an effort to keep you and your family informed about your hospital stay, I am providing you with this information sheet. If you or your family have any questions, please do not hesitate to have the nursing staff page me to set up a meeting time.  Also note that the hospitalist doctors typically change on Tuesdays or Wednesdays to a different hospitalist doctor.  Tricia [name] 12/07/2013 2 (Number of days in the hospital)  Treatment team:  [name], Hospitalist (Internist)  [name], GI specialist  Pertinent labs / studies:  Abdominal ultrasound 12/05/13: Gallstones without acute cholecystitis. Study otherwise negative.  MRCP 12/05/13:  Cholelithiasis without radiographic evidence of cholecystitis or other acute findings. No evidence of biliary or pancreatic ductal dilatation.  HIDA scan 12/06/13: No evidence of cystic duct or biliary obstruction. Some liver dysfunction noted.  Liver enzymes are coming down.   Principle Diagnosis:  Elevated liver enzymes, gallstones, liver dysfunction of uncertain cause   Plan for today: Several tests are still pending including tests for infection of your liver. We are checking your pancreatic enzyme levels and if they are elevated, it suggests that you may have passed a gallstone.   Anticipated discharge date: Likely later this afternoon if you're able to eat okay.

## 2013-12-07 NOTE — Progress Notes (Signed)
[facility] Progress Note  Subjective:  Patient is at HIDA scan.  Objective:  Vital signs in last 24 hours: Temp:  [97.6 F (36.4 C)-98.3 F (36.8 C)] 98.2 F (36.8 C) (07/19 0529) Pulse Rate:  [61-70] 61 (07/19 0529) Resp:  [16] 16 (07/19 0529) BP: (111-127)/(46-67) 127/66 mmHg (07/19 0529) SpO2:  [94 %-99 %] 97 % (07/19 0529) Last BM Date: 12/04/13  Intake/Output from previous day: 07/18 0701 - 07/19 0700 In: 2920 [P.O.:1120; I.V.:1800] Out: -   Lab Results:  Recent Labs  12/05/13 0849 12/06/13 0500 12/07/13 0431  WBC 6.9 6.1 4.8  HGB 15.1* 14.7 12.1  HCT 46.4* 46.7* 39.2  PLT 281 237 193   BMET  Recent Labs  12/05/13 0849 12/06/13 0500 12/07/13 0431  NA 138 138 141  K 4.2 4.2 4.1  CL 100 102 106  CO2 26 25 26   GLUCOSE 113* 88 80  BUN 12 9 10   CREATININE 0.76 0.67 0.76  CALCIUM 9.4 8.9 8.5   LFT  Recent Labs  12/07/13 0431  PROT 5.7*  ALBUMIN 3.0*  AST 105*  ALT 321*  ALKPHOS 178*  BILITOT 0.4   PT/INR  Recent Labs  12/05/13 1230  LABPROT 12.3  INR 0.91   Hepatitis Panel  Recent Labs  12/05/13 1230  HEPBSAG NEGATIVE  HCVAB Reactive*  HEPAIGM NON REACTIVE  HEPBIGM NON REACTIVE    US Abdomen Complete  12/05/2013   CLINICAL DATA:  Abdominal and chest pain.  EXAM: ULTRASOUND ABDOMEN COMPLETE  COMPARISON:  None.  FINDINGS: Gallbladder:  Gallstones are identified measuring up to 1.6 cm. There is no pericholecystic fluid or wall thickening. Sonographer reports negative Murphy's sign.  Common bile duct:  Diameter: 0.5 cm  Liver:  No focal lesion identified. Within normal limits in parenchymal echogenicity.  IVC:  No abnormality visualized.  Pancreas:  Visualized portion unremarkable.  Spleen:  Size and appearance within normal limits.  Right Kidney:  Length: 10.3 cm. Echogenicity within normal limits. No mass or hydronephrosis visualized.  Left Kidney:  Length: 10.5 cm. Echogenicity within normal limits. No mass or  hydronephrosis visualized.  Abdominal aorta:  No aneurysm visualized.  Other findings:  None.  IMPRESSION: Gallstones without acute cholecystitis. The study is otherwise negative.   Electronically Signed   By: Inge Rise M.D.   On: 12/05/2013 11:37   Mr 3d Recon At Scanner  12/05/2013   CLINICAL DATA:  Right-sided abdominal pain. Elevated liver function tests. Cholelithiasis.  EXAM: MRI ABDOMEN WITHOUT AND WITH CONTRAST (INCLUDING MRCP)  TECHNIQUE: Multiplanar multisequence MR imaging of the abdomen was performed both before and after the administration of intravenous contrast. Heavily T2-weighted images of the biliary and pancreatic ducts were obtained, and three-dimensional MRCP images were rendered by post processing.  CONTRAST:  75mL MULTIHANCE GADOBENATE DIMEGLUMINE 529 MG/ML IV SOLN  COMPARISON:  Abdomen ultrasound on 12/05/2013  FINDINGS: Cholelithiasis is demonstrated, however there is no evidence of acute cholecystitis. MRCP images are degraded by motion artifact, however there is no definite evidence of biliary ductal dilatation, with common bile duct measuring approximately 5-6 mm. There is no evidence of pancreatic ductal dilatation.  The liver, pancreas, spleen, adrenal glands, and kidneys are normal in appearance. No soft tissue masses or lymphadenopathy identified. No evidence of inflammatory process or abnormal fluid collections. No evidence of dilated bowel loops.  IMPRESSION: Cholelithiasis, without radiographic evidence of cholecystitis or other acute findings.  MRCP images are degraded by motion artifact, however there is no  evidence of biliary or pancreatic ductal dilatation.   Electronically Signed   By: Earle Gell M.D.   On: 12/05/2013 18:07   Mr Abd W/wo Cm/mrcp  12/05/2013   CLINICAL DATA:  Right-sided abdominal pain. Elevated liver function tests. Cholelithiasis.  EXAM: MRI ABDOMEN WITHOUT AND WITH CONTRAST (INCLUDING MRCP)  TECHNIQUE: Multiplanar multisequence MR imaging of  the abdomen was performed both before and after the administration of intravenous contrast. Heavily T2-weighted images of the biliary and pancreatic ducts were obtained, and three-dimensional MRCP images were rendered by post processing.  CONTRAST:  49mL MULTIHANCE GADOBENATE DIMEGLUMINE 529 MG/ML IV SOLN  COMPARISON:  Abdomen ultrasound on 12/05/2013  FINDINGS: Cholelithiasis is demonstrated, however there is no evidence of acute cholecystitis. MRCP images are degraded by motion artifact, however there is no definite evidence of biliary ductal dilatation, with common bile duct measuring approximately 5-6 mm. There is no evidence of pancreatic ductal dilatation.  The liver, pancreas, spleen, adrenal glands, and kidneys are normal in appearance. No soft tissue masses or lymphadenopathy identified. No evidence of inflammatory process or abnormal fluid collections. No evidence of dilated bowel loops.  IMPRESSION: Cholelithiasis, without radiographic evidence of cholecystitis or other acute findings.  MRCP images are degraded by motion artifact, however there is no evidence of biliary or pancreatic ductal dilatation.   Electronically Signed   By: Earle Gell M.D.   On: 12/05/2013 18:07    Assessment / Plan: -Newly elevated LFT's with sudden onset of epigastric to right sided abdominal pain last evening. Gallstones on ultrasound but no CBD dilation or definite CBD stones; MRI/MRCP also negative for CBD stones/dilitation. LFT's continuing to trend down. ? If she passed a stone vs acute hepatitis of some sort.  -Frequent headaches with frequent use of BC's, no more than 2 per day.  -Positive Hep C Ab but negative viral load in the past.   *EBV and CMV pending.  *Await HIDA scan results.   LOS: 2 days   ZEHR, JESSICA D.  12/07/2013, 8:38 AM  Pager number [phone]  GI Attending Note  I have personally taken an interval history, reviewed the chart, and examined the patient. HIDA scan was normal - no evidence  for acute cholecystitis.  Will repeat amylase and lipase.  If at all elevated then I think she may have passed a stone.  Let's give her a regular lunch and, if OK, d/c home.  I will see her in followup.  Sandy Salaam. Deatra Ina, MD, [facility] [phone]

## 2013-12-07 NOTE — Discharge Instructions (Signed)
Biliary Colic  °Biliary colic is a steady or irregular pain in the upper abdomen. It is usually under the right side of the rib cage. It happens when gallstones interfere with the normal flow of bile from the gallbladder. Bile is a liquid that helps to digest fats. Bile is made in the liver and stored in the gallbladder. When you eat a meal, bile passes from the gallbladder through the cystic duct and the common bile duct into the small intestine. There, it mixes with partially digested food. If a gallstone blocks either of these ducts, the normal flow of bile is blocked. The muscle cells in the bile duct contract forcefully to try to move the stone. This causes the pain of biliary colic.  °SYMPTOMS  °· A person with biliary colic usually complains of pain in the upper abdomen. This pain can be: °¨ In the center of the upper abdomen just below the breastbone. °¨ In the upper-right part of the abdomen, near the gallbladder and liver. °¨ Spread back toward the right shoulder blade. °· Nausea and vomiting. °· The pain usually occurs after eating. °· Biliary colic is usually triggered by the digestive system's demand for bile. The demand for bile is high after fatty meals. Symptoms can also occur when a person who has been fasting suddenly eats a very large meal. Most episodes of biliary colic pass after 1 to 5 hours. After the most intense pain passes, your abdomen may continue to ache mildly for about 24 hours. °DIAGNOSIS  °After you describe your symptoms, your caregiver will perform a physical exam. He or she will pay attention to the upper right portion of your belly (abdomen). This is the area of your liver and gallbladder. An ultrasound will help your caregiver look for gallstones. Specialized scans of the gallbladder may also be done. Blood tests may be done, especially if you have fever or if your pain persists. °PREVENTION  °Biliary colic can be prevented by controlling the risk factors for gallstones. Some of  these risk factors, such as heredity, increasing age, and pregnancy are a normal part of life. Obesity and a high-fat diet are risk factors you can change through a healthy lifestyle. Women going through menopause who take hormone replacement therapy (estrogen) are also more likely to develop biliary colic. °TREATMENT  °· Pain medication may be prescribed. °· You may be encouraged to eat a fat-free diet. °· If the first episode of biliary colic is severe, or episodes of colic keep retuning, surgery to remove the gallbladder (cholecystectomy) is usually recommended. This procedure can be done through small incisions using an instrument called a laparoscope. The procedure often requires a brief stay in the hospital. Some people can leave the hospital the same day. It is the most widely used treatment in people troubled by painful gallstones. It is effective and safe, with no complications in more than 90% of cases. °· If surgery cannot be done, medication that dissolves gallstones may be used. This medication is expensive and can take months or years to work. Only small stones will dissolve. °· Rarely, medication to dissolve gallstones is combined with a procedure called shock-wave lithotripsy. This procedure uses carefully aimed shock waves to break up gallstones. In many people treated with this procedure, gallstones form again within a few years. °PROGNOSIS  °If gallstones block your cystic duct or common bile duct, you are at risk for repeated episodes of biliary colic. There is also a 25% chance that you will develop   a gallbladder infection(acute cholecystitis), or some other complication of gallstones within 10 to 20 years. If you have surgery, schedule it at a time that is convenient for you and at a time when you are not sick. °HOME CARE INSTRUCTIONS  °· Drink plenty of clear fluids. °· Avoid fatty, greasy or fried foods, or any foods that make your pain worse. °· Take medications as directed. °SEEK MEDICAL  CARE IF:  °· You develop a fever over 100.5° F (38.1° C). °· Your pain gets worse over time. °· You develop nausea that prevents you from eating and drinking. °· You develop vomiting. °SEEK IMMEDIATE MEDICAL CARE IF:  °· You have continuous or severe belly (abdominal) pain which is not relieved with medications. °· You develop nausea and vomiting which is not relieved with medications. °· You have symptoms of biliary colic and you suddenly develop a fever and shaking chills. This may signal cholecystitis. Call your caregiver immediately. °· You develop a yellow color to your skin or the white part of your eyes (jaundice). °Document Released: 10/09/2005 Document Revised: 07/31/2011 Document Reviewed: 12/19/2007 °ExitCare® Patient Information ©2015 [employer]. This information is not intended to replace advice given to you by your health care provider. Make sure you discuss any questions you have with your health care provider. ° °

## 2013-12-07 NOTE — Discharge Summary (Addendum)
Physician Discharge Summary  Tricia [name] BTD:[id number] DOB: [birth year] DOA: 12/05/2013  PCP: Tricia Dawson, MD  Admit date: 12/05/2013 Discharge date: 12/07/2013   Recommendations for Outpatient Follow-Up:   1. Close follow up with GI or PCP recommended.  Repeat LFTs in 1 week. 2. F/U CMV and EBV and hepatitis C viral load.   Discharge Diagnosis:   Principal Problem:    Probable Biliary colic Active Problems:    Migraine headache    Hyperlipidemia    Esophageal reflux    Transaminitis    Gallstones    History of hepatitis C    Seizure disorder   Discharge Condition: Improved.  Diet recommendation: Regular.   History of Present Illness:   Tricia [name] is an 53 y.o. female with a PMH of mitral valve prolapse, unknown cause of hepatitis when younger, migraine headaches with aura, meningioma status post resection, colon polyps, seizure disorder, hyperlipidemia, history of hepatitis C antibody test positive with negative viral load who was admitted 12/05/13 with a chief complaint of abdominal pain who was found to have transaminitis on initial presentation. Abdominal ultrasound revealed gallstones without acute cholecystitis.  Hospital Course by Problem:   Principal Problem:  ? Biliary colic / transaminitis / gallstones  S/P MRCP and abdominal U/S which confirmed cholelithiasis without cholecystitis. Lipase WNL.  LFTs improving, suggesting she may have passed a stone. Amylase/lipase not elevated. GI on board. HIDA scan shows hepatobiliary dysfunction. Followup cytomegalovirus and Epstein-Barr virus studies. Active Problems:  Seizure disorder  Continue phenobarbital. Migraine headache  Continue PRN sumatriptan. Hyperlipidemia  Diet controlled. Esophageal reflux  Continue PPI and PRN Maalox. History of hepatitis C  Serologies + hepatitis C. Hep B/A negative.  Check quantitative hepatitis C RNA.  Protime WNL.   Medical Consultants:    Dr.  Erskine Emery, GI.  Discharge Exam:   Filed Vitals:   12/07/13 1450  BP: 117/63  Pulse: 62  Temp: 97.9 F (36.6 C)  Resp: 16   Filed Vitals:   12/06/13 1350 12/06/13 2052 12/07/13 0529 12/07/13 1450  BP: 111/67 113/46 127/66 117/63  Pulse: 69 70 61 62  Temp: 98.3 F (36.8 C) 97.6 F (36.4 C) 98.2 F (36.8 C) 97.9 F (36.6 C)  TempSrc: Oral Oral Oral Oral  Resp: 16 16 16 16   Height:      Weight:      SpO2: 99% 94% 97% 98%    Gen:  NAD Cardiovascular:  RRR, No M/R/G Respiratory: Lungs CTAB Gastrointestinal: Abdomen soft, NT/ND with normal active bowel sounds. Extremities: No C/E/C    Discharge Instructions:   Discharge Instructions   Call MD for:  persistant nausea and vomiting    Complete by:  As directed      Call MD for:  severe uncontrolled pain    Complete by:  As directed      Call MD for:  temperature >100.4    Complete by:  As directed      Diet general    Complete by:  As directed   As tolerated, avoid fried foods, greasy foods.     Discharge instructions    Complete by:  As directed   You were cared for by [name]  (a hospitalist) during your hospital stay. If you have any questions about your discharge medications or the care you received while you were in the hospital after you are discharged, you can call the unit and ask to speak with the hospitalist on call if the  hospitalist that took care of you is not available. Once you are discharged, your primary care physician will handle any further medical issues. Please note that NO REFILLS for any discharge medications will be authorized once you are discharged, as it is imperative that you return to your primary care physician (or establish a relationship with a primary care physician if you do not have one) for your aftercare needs so that they can reassess your need for medications and monitor your lab values.  Any outstanding tests can be reviewed by your PCP at your follow up visit.  It is also  important to review any medicine changes with your PCP.  Please bring these d/c instructions with you to your next visit so your physician can review these changes with you.     Increase activity slowly    Complete by:  As directed             Medication List         BC HEADACHE POWDER PO  Take 1 packet by mouth as needed.     bismuth subsalicylate 315 VV/61YW suspension  Commonly known as:  PEPTO BISMOL  Take 30 mLs by mouth every 6 (six) hours as needed for indigestion.     cholecalciferol 1000 UNITS tablet  Commonly known as:  VITAMIN D  Take 1,000 Units by mouth daily.     Estradiol-Norethindrone Acet 0.5-0.1 MG per tablet  Take by mouth daily.     MULTIVITAMIN PO  Take 1 tablet by mouth daily.     Omega-3 1000 MG Caps  Take 1 capsule by mouth daily.     ondansetron 4 MG tablet  Commonly known as:  ZOFRAN  Take 1 tablet (4 mg total) by mouth every 6 (six) hours as needed for nausea.     PHENobarbital 97.2 MG tablet  Commonly known as:  LUMINAL  Take 97.2 mg by mouth at bedtime.     SUMAtriptan 100 MG tablet  Commonly known as:  IMITREX  Take 1 tablet (100 mg total) by mouth once as needed for migraine (can repeat in 2 hours ).     VITAMIN B 12 PO  Take 1 tablet by mouth daily.           Follow-up Information   Follow up with Tricia Dawson, MD. Schedule an appointment as soon as possible for a visit in 1 week. [facility] follow up on outstanding tests (hepatitis C viral load, EBV, CMV studies), possible referral to surgeon.)    Specialty:  Internal Medicine   Contact information:   [place] [address] [phone]       Follow up with Erskine Emery, MD. Schedule an appointment as soon as possible for a visit in 2 weeks. [facility] follow up.)    Specialty:  Gastroenterology   Contact information:   [address] [place] [address] [phone]        The results of significant diagnostics from this  hospitalization (including imaging, microbiology, ancillary and laboratory) are listed below for reference.      Procedures and diagnostic studies:   Abdominal ultrasound 12/05/13: Gallstones without acute cholecystitis. Study otherwise negative.  MRCP 12/05/13: Cholelithiasis without radiographic evidence of cholecystitis or other acute findings. No evidence of biliary or pancreatic ductal dilatation. NM Hepatobiliary scan: No evidence of cystic duct or biliary obstruction. Increased urinary excretion of radiopharmaceutical activity is seen, indicating hepatocellular dysfunction.    Labs:    Basic Metabolic Panel:  Recent Labs Lab 12/05/13 0849 12/06/13 0500 12/07/13 0431  NA 138 138 141  K 4.2 4.2 4.1  CL 100 102 106  CO2 26 25 26   GLUCOSE 113* 88 80  BUN 12 9 10   CREATININE 0.76 0.67 0.76  CALCIUM 9.4 8.9 8.5   GFR Estimated Creatinine Clearance: 86.2 ml/min (by C-G formula based on Cr of 0.76). Liver Function Tests:  Recent Labs Lab 12/05/13 0849 12/06/13 0500 12/07/13 0431  AST 1125* 340* 105*  ALT 791* 551* 321*  ALKPHOS 225* 219* 178*  BILITOT 1.6* 0.9 0.4  PROT 7.7 6.5 5.7*  ALBUMIN 4.1 3.4* 3.0*    Recent Labs Lab 12/05/13 0848 12/07/13 0431  LIPASE 51 24  AMYLASE  --  50   Coagulation profile  Recent Labs Lab 12/05/13 1230  INR 0.91    CBC:  Recent Labs Lab 12/05/13 0849 12/06/13 0500 12/07/13 0431  WBC 6.9 6.1 4.8  NEUTROABS 4.7  --   --   HGB 15.1* 14.7 12.1  HCT 46.4* 46.7* 39.2  MCV 88.5 91.6 92.5  PLT 281 237 193    Time coordinating discharge: 35 minutes.  Signed:  RAMA,CHRISTINA  Pager [phone] [facility] 12/07/2013, 3:33 PM

## 2013-12-08 ENCOUNTER — Telehealth: Payer: Self-pay | Admitting: Gastroenterology

## 2013-12-08 ENCOUNTER — Telehealth: Payer: Self-pay | Admitting: Internal Medicine

## 2013-12-08 NOTE — Telephone Encounter (Signed)
Left message for pt to call back.  Pt scheduled to see Alonza Bogus PA 12/22/13@3pm . Pt aware of appt.

## 2013-12-08 NOTE — Telephone Encounter (Signed)
Per Dr Regis Bill, results not back, schedule post hosp visit next week

## 2013-12-08 NOTE — Telephone Encounter (Signed)
Pt was released from [facility] on yesterday, pt was informed to contact her primary to discuss outstanding test that was done. Pt would like for you to call her back with the results and let her know if she need to come for a follow up appt.

## 2013-12-09 LAB — CYTOMEGALOVIRUS PCR, QUALITATIVE: Cytomegalovirus DNA: NOT DETECTED

## 2013-12-09 NOTE — Telephone Encounter (Signed)
Pt has appt with PA in gi on 12-23-13. Pt just wants results

## 2013-12-09 NOTE — ED Provider Notes (Signed)
Medical screening examination/treatment/procedure(s) were conducted as a shared visit with non-physician practitioner(s) and myself.  I personally evaluated the patient during the encounter.   EKG Interpretation   Date/Time:  Friday December 05 2013 08:30:33 EDT Ventricular Rate:  77 PR Interval:  124 QRS Duration: 84 QT Interval:  389 QTC Calculation: 440 R Axis:   69 Text Interpretation:  Sinus rhythm Ventricular bigeminy Confirmed by  Milley Vining  MD, Anaysha Andre ([id number]) on 12/05/2013 8:35:22 AM      I interviewed and examined the patient. Lungs are CTAB. Cardiac exam wnl. Abdomen soft w/ mod ttp of epigastric area and LUQ.  Case discussed w/ GI. Will admit.   Blanchard Kelch, MD 12/09/13 [phone]

## 2013-12-09 NOTE — Telephone Encounter (Signed)
Patient notified that results are not available at this time.  Will follow up with GI and will see WP if needed.

## 2013-12-09 NOTE — Telephone Encounter (Signed)
What test is the patient referring?

## 2013-12-09 NOTE — Telephone Encounter (Signed)
I am assuming it is the HCV rna viral copies   Was in process last night and still is in process;   so I  Still dont have  Any .results  (She should sign up for my chart portal  if not already done) I agree she needs FU with GI and can   See me only  if needed for other issues .

## 2013-12-10 LAB — HCV RNA QUANT: HCV QUANT: NOT DETECTED [IU]/mL — AB (ref <15)

## 2013-12-12 ENCOUNTER — Telehealth: Payer: Self-pay | Admitting: Internal Medicine

## 2013-12-12 NOTE — Telephone Encounter (Signed)
Pt would results of labs pls call. °

## 2013-12-12 NOTE — Telephone Encounter (Signed)
Left a message at the listed number informing patient of normal results.  Will sent go GI.

## 2013-12-15 LAB — EPSTEIN BARR VRS(EBV DNA BY PCR): EBV DNA QN by PCR: <200

## 2013-12-16 ENCOUNTER — Encounter: Payer: Self-pay | Admitting: Family Medicine

## 2013-12-17 ENCOUNTER — Encounter: Payer: Self-pay | Admitting: *Deleted

## 2013-12-22 ENCOUNTER — Other Ambulatory Visit (INDEPENDENT_AMBULATORY_CARE_PROVIDER_SITE_OTHER): Payer: Managed Care, Other (non HMO)

## 2013-12-22 ENCOUNTER — Encounter: Payer: Self-pay | Admitting: Gastroenterology

## 2013-12-22 ENCOUNTER — Ambulatory Visit (INDEPENDENT_AMBULATORY_CARE_PROVIDER_SITE_OTHER): Payer: Managed Care, Other (non HMO) | Admitting: Gastroenterology

## 2013-12-22 VITALS — BP 122/78 | HR 78 | Ht 65.0 in | Wt 220.6 lb

## 2013-12-22 DIAGNOSIS — R1011 Right upper quadrant pain: Secondary | ICD-10-CM

## 2013-12-22 DIAGNOSIS — R7989 Other specified abnormal findings of blood chemistry: Secondary | ICD-10-CM

## 2013-12-22 DIAGNOSIS — R945 Abnormal results of liver function studies: Secondary | ICD-10-CM

## 2013-12-22 DIAGNOSIS — K8020 Calculus of gallbladder without cholecystitis without obstruction: Secondary | ICD-10-CM

## 2013-12-22 LAB — HEPATIC FUNCTION PANEL
ALT: 25 U/L (ref 0–35)
AST: 18 U/L (ref 0–37)
Albumin: 4.2 g/dL (ref 3.5–5.2)
Alkaline Phosphatase: 96 U/L (ref 39–117)
BILIRUBIN DIRECT: 0.1 mg/dL (ref 0.0–0.3)
TOTAL PROTEIN: 7.4 g/dL (ref 6.0–8.3)
Total Bilirubin: 0.4 mg/dL (ref 0.2–1.2)

## 2013-12-22 NOTE — Progress Notes (Signed)
     12/22/2013 Tricia [name] [id number] [birth year]   History of Present Illness:  Patient is a 53 year old female who was recently seen by our group during hospitalization. She presented to the hospital with complaints of right upper quadrant and epigastric abdominal pain. LFTs were elevated on presentation with AST of 1125, ALT 71, alkaline phosphatase 225, and total bili of 1.6. Throughout her hospitalization LFTs trended down. Ultrasound showed gallstones, was otherwise negative. MRCP showed cholelithiasis, but was otherwise unremarkable as well. HIDA scan showed no evidence of cystic duct or biliary obstruction, but showed increased urinary excretion indicating hepatocellular dysfunction. Acute viral hepatitis panel was negative except for a positive hepatitis C antibody. This was followed by an HCV RNA quantitative study which was negative, however. EBV and CMV studies were also negative. Amylase and lipase were normal.  After all of this evaluation, she was tolerating a diet and was discharged home to followup as an outpatient.  She presents to the office today stating that she does feel better, however, she still just does not feel good. She still has upper quadrant and epigastric abdominal pain. She has been trying to watch what she eats, avoiding fatty and greasy foods. There is occasional nausea.   Current Medications, Allergies, Past Medical History, Past Surgical History, Family History and Social History were reviewed in [employer] record.   Physical Exam: BP 122/78  Pulse 78  Ht 5\' 5"  (1.651 m)  Wt 220 lb 9.6 oz (100.064 kg)  BMI 36.71 kg/m2  LMP 12/21/2010 General: Well developed white female in no acute distress Head: Normocephalic and atraumatic Eyes:  Sclerae anicteric, conjunctiva pink  Ears: Normal auditory acuity Lungs: Clear throughout to auscultation Heart: Regular rate and rhythm Abdomen: Soft, non-distended.  Normal bowel sounds.  TTP in  epigastrium and RUQ. Musculoskeletal: Symmetrical with no gross deformities  Extremities: No edema  Neurological: Alert oriented x 4, grossly non-focal Psychological:  Alert and cooperative. Normal mood and affect  Assessment and Recommendations: -Elevated LFT's with abdominal pain and gallstones on imaging:  Still having pain and some nausea.  No other explanation for her symptoms.  Will recheck LFT's today and will refer to Dr. Johney Maine at [facility] at patient's request for possible cholecystectomy.

## 2013-12-22 NOTE — Patient Instructions (Signed)
Your physician has requested that you go to the basement for the following lab work before leaving today: Hepatic Function Panel   You will get a call from [facility] regarding your appointment. Their phone number and address is below:  [address], [place], [place] [address]  [phone]

## 2013-12-23 ENCOUNTER — Telehealth: Payer: Self-pay | Admitting: *Deleted

## 2013-12-23 NOTE — Telephone Encounter (Signed)
Patient is scheduled with [facility] for 01-12-2014 at 145 pm but patient needs to be there at 130 pm for registration.  Patient was notified and patient verbalized understanding.

## 2013-12-25 NOTE — Progress Notes (Signed)
Agree with surgical consultation. Recommend HIDA scan

## 2014-01-12 ENCOUNTER — Ambulatory Visit (INDEPENDENT_AMBULATORY_CARE_PROVIDER_SITE_OTHER): Payer: Managed Care, Other (non HMO) | Admitting: Surgery

## 2014-01-29 ENCOUNTER — Ambulatory Visit (INDEPENDENT_AMBULATORY_CARE_PROVIDER_SITE_OTHER): Payer: Managed Care, Other (non HMO) | Admitting: Surgery

## 2014-02-11 ENCOUNTER — Other Ambulatory Visit (INDEPENDENT_AMBULATORY_CARE_PROVIDER_SITE_OTHER): Payer: Self-pay | Admitting: Surgery

## 2014-02-11 DIAGNOSIS — K8010 Calculus of gallbladder with chronic cholecystitis without obstruction: Secondary | ICD-10-CM

## 2014-03-19 ENCOUNTER — Other Ambulatory Visit (INDEPENDENT_AMBULATORY_CARE_PROVIDER_SITE_OTHER): Payer: Self-pay | Admitting: Surgery

## 2014-05-20 ENCOUNTER — Other Ambulatory Visit: Payer: Self-pay

## 2014-05-20 DIAGNOSIS — Z1231 Encounter for screening mammogram for malignant neoplasm of breast: Secondary | ICD-10-CM

## 2014-05-22 HISTORY — PX: POLYPECTOMY: SHX149

## 2014-05-22 HISTORY — PX: COLONOSCOPY: SHX174

## 2014-06-18 ENCOUNTER — Ambulatory Visit
Admission: RE | Admit: 2014-06-18 | Discharge: 2014-06-18 | Disposition: A | Discharge status: Home / Self Care | Payer: Managed Care, Other (non HMO) | Source: Ambulatory Visit

## 2014-06-18 DIAGNOSIS — Z1231 Encounter for screening mammogram for malignant neoplasm of breast: Secondary | ICD-10-CM

## 2014-07-10 ENCOUNTER — Ambulatory Visit (INDEPENDENT_AMBULATORY_CARE_PROVIDER_SITE_OTHER): Payer: Managed Care, Other (non HMO) | Admitting: Nurse Practitioner

## 2014-07-10 VITALS — BP 109/78 | HR 80 | Temp 97.6°F | Resp 18 | Ht 65.5 in | Wt 246.0 lb

## 2014-07-10 DIAGNOSIS — J069 Acute upper respiratory infection, unspecified: Secondary | ICD-10-CM

## 2014-07-10 MED ORDER — AZITHROMYCIN 250 MG PO TABS: ORAL_TABLET | ORAL | Status: DC

## 2014-07-10 MED ORDER — HYDROCODONE-HOMATROPINE 5-1.5 MG/5ML PO SYRP: 5.0000 mL | ORAL_SOLUTION | Freq: Every evening | ORAL | Status: DC | PRN

## 2014-07-10 NOTE — Progress Notes (Signed)
Pre visit review using our clinic review tool, if applicable. No additional management support is needed unless otherwise documented below in the visit note. 

## 2014-07-10 NOTE — Patient Instructions (Signed)
Start antibiotic. Eat yogurt daily to help prevent antibiotic -associated diarrhea  Start neilmed sinus rinse. Use twice daily for 7 -10 days.  Take cough syrup at night. Stop over the counter cold medicines & syrups.  Take tylenol or ibuprophen as needed for fever, aches, Headache.  Let us know if not feeling better in 7 to 10 days, or if you feel worse.

## 2014-07-15 NOTE — Progress Notes (Signed)
   Subjective:    Patient ID: Tricia [name], female    DOB: 02-08-1961, 54 y.o.   MRN: [id number]  Cough This is a new problem. The current episode started 1 to 4 weeks ago (2 wks). The problem has been waxing and waning. The problem occurs hourly. The cough is non-productive. Associated symptoms include chills, ear congestion, headaches, nasal congestion, postnasal drip and a sore throat. Pertinent negatives include no chest pain, ear pain, fever, myalgias, shortness of breath or wheezing. The symptoms are aggravated by lying down. She has tried OTC cough suppressant for the symptoms. The treatment provided no relief.      Review of Systems  Constitutional: Positive for chills. Negative for fever and fatigue.  HENT: Positive for postnasal drip and sore throat. Negative for ear pain.   Respiratory: Negative for chest tightness, shortness of breath and wheezing.   Cardiovascular: Negative for chest pain.  Musculoskeletal: Negative for myalgias.  Neurological: Positive for headaches.       Objective:   Physical Exam  Constitutional: She is oriented to person, place, and time. She appears well-developed and well-nourished.  HENT:  Head: Normocephalic and atraumatic.  Right Ear: External ear normal.  Left Ear: External ear normal.  Mouth/Throat: Oropharynx is clear and moist. No oropharyngeal exudate.  Nasal quality to voice, frequent coughing during exam  Eyes: Conjunctivae are normal. Right eye exhibits no discharge. Left eye exhibits no discharge.  Neck: Normal range of motion. Neck supple. No thyromegaly present.  Cardiovascular: Normal rate, regular rhythm and normal heart sounds.   No murmur heard. Pulmonary/Chest: Effort normal and breath sounds normal. No respiratory distress. She has no wheezes. She has no rales.  Lymphadenopathy:    She has no cervical adenopathy.  Neurological: She is alert and oriented to person, place, and time.  Skin: Skin is warm and dry.    Psychiatric: She has a normal mood and affect. Her behavior is normal. Thought content normal.  Vitals reviewed.         Assessment & Plan:  1. Upper respiratory infection with cough and congestion Duration 2 weeks - azithromycin (ZITHROMAX) 250 MG tablet; Take 2 T PO day 1, then 1 T PO days 2-5  Dispense: 6 tablet; Refill: 0 - HYDROcodone-homatropine (HYCODAN) 5-1.5 MG/5ML syrup; Take 5 mLs by mouth at bedtime as needed for cough.  Dispense: 120 mL; Refill: 0 See pt instructions for complete plan. F/u PRN

## 2014-07-29 ENCOUNTER — Ambulatory Visit (INDEPENDENT_AMBULATORY_CARE_PROVIDER_SITE_OTHER): Payer: Managed Care, Other (non HMO) | Admitting: Internal Medicine

## 2014-07-29 ENCOUNTER — Encounter: Payer: Self-pay | Admitting: Internal Medicine

## 2014-07-29 VITALS — BP 126/82 | Temp 98.1°F | Ht 64.75 in | Wt 246.8 lb

## 2014-07-29 DIAGNOSIS — E785 Hyperlipidemia, unspecified: Secondary | ICD-10-CM

## 2014-07-29 DIAGNOSIS — Z114 Encounter for screening for human immunodeficiency virus [HIV]: Secondary | ICD-10-CM

## 2014-07-29 DIAGNOSIS — Z6841 Body Mass Index (BMI) 40.0 and over, adult: Secondary | ICD-10-CM

## 2014-07-29 DIAGNOSIS — Z0000 Encounter for general adult medical examination without abnormal findings: Secondary | ICD-10-CM | POA: Diagnosis not present

## 2014-07-29 LAB — CBC WITH DIFFERENTIAL/PLATELET
Basophils Absolute: 0.1 10*3/uL (ref 0.0–0.1)
Basophils Relative: 0.7 % (ref 0.0–3.0)
EOS ABS: 0.2 10*3/uL (ref 0.0–0.7)
Eosinophils Relative: 2.2 % (ref 0.0–5.0)
HCT: 41.4 % (ref 36.0–46.0)
HEMOGLOBIN: 13.6 g/dL (ref 12.0–15.0)
Lymphocytes Relative: 27.2 % (ref 12.0–46.0)
Lymphs Abs: 2.3 10*3/uL (ref 0.7–4.0)
MCHC: 32.8 g/dL (ref 30.0–36.0)
MCV: 87.1 fl (ref 78.0–100.0)
MONOS PCT: 6.1 % (ref 3.0–12.0)
Monocytes Absolute: 0.5 10*3/uL (ref 0.1–1.0)
NEUTROS ABS: 5.5 10*3/uL (ref 1.4–7.7)
NEUTROS PCT: 63.8 % (ref 43.0–77.0)
PLATELETS: 322 10*3/uL (ref 150.0–400.0)
RBC: 4.76 Mil/uL (ref 3.87–5.11)
RDW: 13.4 % (ref 11.5–15.5)
WBC: 8.6 10*3/uL (ref 4.0–10.5)

## 2014-07-29 LAB — BASIC METABOLIC PANEL
BUN: 11 mg/dL (ref 6–23)
CALCIUM: 9.5 mg/dL (ref 8.4–10.5)
CO2: 29 mEq/L (ref 19–32)
CREATININE: 0.77 mg/dL (ref 0.40–1.20)
Chloride: 104 mEq/L (ref 96–112)
GFR: 82.4 mL/min (ref >60.00)
Glucose, Bld: 87 mg/dL (ref 70–99)
POTASSIUM: 5 meq/L (ref 3.5–5.1)
SODIUM: 138 meq/L (ref 135–145)

## 2014-07-29 LAB — HEPATIC FUNCTION PANEL
ALK PHOS: 95 U/L (ref 39–117)
ALT: 18 U/L (ref 0–35)
AST: 16 U/L (ref 0–37)
Albumin: 4.2 g/dL (ref 3.5–5.2)
BILIRUBIN TOTAL: 0.2 mg/dL (ref 0.2–1.2)
Bilirubin, Direct: 0.1 mg/dL (ref 0.0–0.3)
TOTAL PROTEIN: 7.4 g/dL (ref 6.0–8.3)

## 2014-07-29 LAB — LIPID PANEL
Cholesterol: 213 mg/dL — ABNORMAL HIGH (ref 0–200)
HDL: 60.4 mg/dL (ref >39.00)
LDL CALC: 134 mg/dL — AB (ref 0–99)
NonHDL: 152.6
Total CHOL/HDL Ratio: 4
Triglycerides: 94 mg/dL (ref 0.0–149.0)
VLDL: 18.8 mg/dL (ref 0.0–40.0)

## 2014-07-29 LAB — TSH: TSH: 0.85 u[IU]/mL (ref 0.35–4.50)

## 2014-07-29 NOTE — Progress Notes (Signed)
Pre visit review using our clinic review tool, if applicable. No additional management support is needed unless otherwise documented below in the visit note.   Chief Complaint  Patient presents with  . Annual Exam    HPI: Patient  Tricia [name]  54 y.o. comes in today for Preventive Health Care visit  Since last visit she has seen nudelman   Still on phenobarb. Slight low leve;l.  She had a gallbladder problem in the summer. Her father died last 11-04-2022 lived out of town had a heart attack going into surgery. Still finishing up tasks related to that. She's gained weight over the last year because of lack of exercise and dietary inattention. Plans on getting back into a healthier lifestyle. Is on hormone replacement ask a question about that having no symptoms has appointment with her gynecologist soon Dr. Ulanda Edison.  Health Maintenance  Topic Date Due  . INFLUENZA VACCINE  12/20/2013  . MAMMOGRAM  06/18/2016  . PAP SMEAR  07/02/2016  . COLONOSCOPY  02/05/2020  . TETANUS/TDAP  02/07/2021  . HIV Screening  Completed   Health Maintenance Review LIFESTYLE:  Exercise:  Working 2 x per week  Weight gain  Tobacco/ETS: no Alcohol: no Sugar beverages: no Sleep: about 5  Difficulties at times  Drug use: no  Colonoscopy: utd PAP:utd MAMMO: utd   Stress dad died last MAY.  83 MI  ROS:  GEN/ HEENT: No fever, significant weight changes sweats headaches vision problems hearing changes, CV/ PULM; No chest pain shortness of breath cough, syncope,edema  change in exercise tolerance. GI /GU: No adominal pain, vomiting, change in bowel habits. No blood in the stool. No significant GU symptoms. SKIN/HEME: ,no acute skin rashes suspicious lesions or bleeding. No lymphadenopathy, nodules, masses.  NEURO/ PSYCH:  No neurologic signs such as weakness numbness. No depression anxiety. IMM/ Allergy: No unusual infections.  Allergy .   REST of 12 system review negative except as per HPI   Past Medical  History  Diagnosis Date  . MVP (mitral valve prolapse)     echo 02 normal  . Jaundice due to hepatitis     hx when younger unknown cause resolved   . Glucosuria     with normal blood sugar  . Meningioma   . Migraine headache     some aura  period related  imitrex helps  . Colon polyps 2006, 2011    TUBULAR ADENOMA AND HYPERPLASTIC POLYP  . Seizures   . HLD (hyperlipidemia)   . Hepatitis C aby neg viral copies     serology positive but neg viral copies   . Esophageal stricture 2012  . Ulcer 2012    Past Surgical History  Procedure Laterality Date  . Appendectomy    . Tubal ligation    . Frontal meningioma resected  1998  . Cin d&c conization  2000  . Bunionectomy      laft  . Left shoulder dislocation surgery  2005  . Colonoscopy w/ biopsies  2011    SEVERAL   . Esophagogastroduodenoscopy  2012    Family History  Problem Relation Age of Onset  . Colon cancer Father   . Colon cancer Paternal Aunt   . Colon cancer      both GMs   . Colon cancer Paternal Uncle   . Hyperlipidemia Father   . Other Brother     transient global amnesia  . Crohn's disease Daughter   . Coronary artery disease Father 79  MI  . Coronary artery disease Maternal Uncle 68  . Diabetes Father     elderly  . Colon cancer Mother     History   Social History  . Marital Status: Legally Separated    Spouse Name: N/A  . Number of Children: 3  . Years of Education: N/A   Occupational History  . accounting [employer]   Social History Main Topics  . Smoking status: Former Smoker -- 1.50 packs/day    Types: Cigarettes    Quit date: 05/22/1990  . Smokeless tobacco: Never Used  . Alcohol Use: Yes     Comment: occas  . Drug Use: No  . Sexual Activity: Not on file   Other Topics Concern  . 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 Aspirin-Salicylamide-Caffeine (BC HEADACHE POWDER PO) Take 1 packet by mouth as needed.  . bismuth subsalicylate (PEPTO BISMOL) 262 MG/15ML suspension Take 30 mLs by mouth every 6 (six) hours as needed for indigestion.  . Estradiol-Norethindrone Acet 0.5-0.1 MG per tablet Take by mouth daily.  . Multiple Vitamin (MULTIVITAMIN PO) Take 1 tablet by mouth daily.    . OMEGA-3 1000 MG CAPS Take 1 capsule by mouth daily.    Marland Kitchen PHENobarbital (LUMINAL) 97.2 MG tablet Take 97.2 mg by mouth at bedtime.   . cholecalciferol (VITAMIN D) 1000 UNITS tablet Take 1,000 Units by mouth daily.    . Cyanocobalamin (VITAMIN B 12 PO) Take 1 tablet by mouth daily.    . SUMAtriptan (IMITREX) 100 MG tablet Take 1 tablet (100 mg total) by mouth once as needed for migraine (can repeat in 2 hours ).  . [DISCONTINUED] azithromycin (ZITHROMAX) 250 MG tablet Take 2 T PO day 1, then 1 T PO days 2-5  . [DISCONTINUED] HYDROcodone-homatropine (HYCODAN) 5-1.5 MG/5ML syrup Take 5 mLs by mouth at bedtime as needed for cough.  . [DISCONTINUED] ondansetron (ZOFRAN) 4 MG tablet Take 1 tablet (4 mg total) by mouth every 6 (six) hours as needed for nausea. (Patient not taking: Reported on 07/10/2014)    EXAM:  BP 126/82 mmHg  Temp(Src) 98.1 F (36.7 C) (Oral)  Ht 5' 4.75" (1.645 m)  Wt 246 lb 12.8 oz (111.948 kg)  BMI 41.37 kg/m2  LMP 12/21/2010  Body mass index is 41.37 kg/(m^2).  Physical Exam: Vital signs reviewed IRJ:JOAC is a well-developed well-nourished alert cooperative    who appearsr stated age in no acute distress.  HEENT: normocephalic atraumatic , Eyes: PERRL EOM's full, conjunctiva clear, Nares: paten,t no deformity discharge or tenderness., Ears: no deformity EAC's clear TMs with normal landmarks. Mouth: clear OP, no lesions, edema.  Moist mucous membranes. Dentition in adequate repair. NECK: supple  without masses, thyromegaly or bruits. CHEST/PULM:  Clear to auscultation and percussion breath sounds equal no wheeze , rales or rhonchi. No chest wall deformities or tenderness. CV: PMI is nondisplaced, S1 S2 no gallops, murmurs, rubs. Peripheral pulses are full without delay.No JVD .  Breast: normal by inspection . No dimpling, discharge, masses, tenderness or discharge . ABDOMEN: Bowel sounds normal nontender  No guard or rebound, no hepato splenomegal no CVA  tenderness.  No hernia. Extremtities:  No clubbing cyanosis or edema, no acute joint swelling or redness no focal atrophy NEURO:  Oriented x3, cranial nerves 3-12 appear to be intact, no obvious focal weakness,gait within normal limits no abnormal reflexes or asymmetrical SKIN: No acute rashes normal turgor, color, no bruising or petechiae. PSYCH: Oriented, good eye contact, no obvious depression anxiety, cognition and judgment appear normal. LN: no cervical axillary inguinal adenopathy   ASSESSMENT AND PLAN:  Discussed the following assessment and plan:  Visit for preventive health examination - Plan: Basic metabolic panel, CBC with Differential/Platelet, Hepatic function panel, Lipid panel, TSH  Hyperlipidemia - Plan: Basic metabolic panel, CBC with Differential/Platelet, Hepatic function panel, Lipid panel, TSH  Screening for HIV without presence of risk factors - Plan: Basic metabolic panel, CBC with Differential/Platelet, Hepatic function panel, Lipid panel, TSH  BMI 40.0-44.9, adult - Counseled Discussed hormone replacement therapy risks benefits discussed this with her gynecologist. Patient Care Team: Burnis Medin, MD as PCP - General Jovita Gamma, MD (Neurosurgery) Inda Castle, MD (Gastroenterology) Newton Pigg, MD (Obstetrics and Gynecology) Lindwood Coke, MD as Attending Physician (Dermatology) Patient Instructions    [place]  . Will notify you  of labs when available. Wt Readings from Last 3 Encounters:  07/29/14 246 lb 12.8 oz (111.948 kg)  07/10/14 246 lb (111.585 kg)  12/22/13 220 lb 9.6 oz (100.064 kg)    Healthy lifestyle includes : At least 150 minutes of exercise weeks  , weight at healthy levels, which is usually   BMI 19-25. Avoid trans fats and processed foods;  Increase fresh fruits and veges to 5 servings per day. And avoid sweet beverages including tea and juice. Mediterranean diet with olive oil and nuts have been noted to be heart and brain healthy . Avoid tobacco products . Limit  alcohol to  7 per week for women and 14 servings for men.  Get adequate sleep . Wear seat belts . Don't text and drive .      Why follow it? Research shows. . Those who follow the Mediterranean diet have a reduced risk of heart disease  . The diet is associated with a reduced incidence of Parkinson's and Alzheimer's diseases . People following the diet may have longer life expectancies and lower rates of chronic diseases  . The Dietary Guidelines for Americans recommends the Mediterranean diet as an eating plan to promote health and prevent disease  What Is the Mediterranean Diet?  . Healthy eating plan based on typical foods and recipes of Mediterranean-style cooking . The diet is primarily a plant based diet; these foods should make up a majority of meals   Starches - Plant based foods should make up a majority of meals - They are an important sources of vitamins, minerals, energy, antioxidants, and fiber - Choose whole grains, foods high in fiber and minimally processed items  - Typical grain sources include wheat, oats, barley, corn, brown rice, bulgar, farro, millet, polenta, couscous  - Various types of beans include chickpeas, lentils, fava beans, black beans, white beans   Fruits  Veggies - Large quantities of antioxidant rich fruits & veggies; 6 or more servings  - Vegetables can be eaten raw or lightly  drizzled with oil and cooked  - Vegetables common to the traditional Mediterranean Diet include: artichokes, arugula, beets, broccoli, brussel sprouts, cabbage, carrots, celery, collard greens, cucumbers, eggplant, kale, leeks, lemons, lettuce, mushrooms,  okra, onions, peas, peppers, potatoes, pumpkin, radishes, rutabaga, shallots, spinach, sweet potatoes, turnips, zucchini - Fruits common to the Mediterranean Diet include: apples, apricots, avocados, cherries, clementines, dates, figs, grapefruits, grapes, melons, nectarines, oranges, peaches, pears, pomegranates, strawberries, tangerines  Fats - Replace butter and margarine with healthy oils, such as olive oil, canola oil, and tahini  - Limit nuts to no more than a handful a day  - Nuts include walnuts, almonds, pecans, pistachios, pine nuts  - Limit or avoid candied, honey roasted or heavily salted nuts - Olives are central to the [employer] - can be eaten whole or used in a variety of dishes   Meats Protein - Limiting red meat: no more than a few times a month - When eating red meat: choose lean cuts and keep the portion to the size of deck of cards - Eggs: approx. 0 to 4 times a week  - Fish and lean poultry: at least 2 a week  - Healthy protein sources include, chicken, [place], lean beef, lamb - Increase intake of seafood such as tuna, salmon, trout, mackerel, shrimp, scallops - Avoid or limit high fat processed meats such as sausage and bacon  Dairy - Include moderate amounts of low fat dairy products  - Focus on healthy dairy such as fat free yogurt, skim milk, low or reduced fat cheese - Limit dairy products higher in fat such as whole or 2% milk, cheese, ice cream  Alcohol - Moderate amounts of red wine is ok  - No more than 5 oz daily for women (all ages) and men older than age 20  - No more than 10 oz of wine daily for men younger than 17  Other - Limit sweets and other desserts  - Use herbs and spices instead of salt to  flavor foods  - Herbs and spices common to the traditional Mediterranean Diet include: basil, bay leaves, chives, cloves, cumin, fennel, garlic, lavender, marjoram, mint, oregano, parsley, pepper, rosemary, sage, savory, sumac, tarragon, thyme   It's not just a diet, it's a lifestyle:  . The Mediterranean diet includes lifestyle factors typical of those in the region  . Foods, drinks and meals are best eaten with others and savored . Daily physical activity is important for overall good health . This could be strenuous exercise like running and aerobics . This could also be more leisurely activities such as walking, housework, yard-work, or taking the stairs . Moderation is the key; a balanced and healthy diet accommodates most foods and drinks . Consider portion sizes and frequency of consumption of certain foods   Meal Ideas & Options:  . Breakfast:  o Whole wheat toast or whole wheat English muffins with peanut butter & hard boiled egg o Steel cut oats topped with apples & cinnamon and skim milk  o Fresh fruit: banana, strawberries, melon, berries, peaches  o Smoothies: strawberries, bananas, greek yogurt, peanut butter o Low fat greek yogurt with blueberries and granola  o Egg white omelet with spinach and mushrooms o Breakfast couscous: whole wheat couscous, apricots, skim milk, cranberries  . Sandwiches:  o Hummus and grilled vegetables (peppers, zucchini, squash) on whole wheat bread   o Grilled chicken on whole wheat pita with lettuce, tomatoes, cucumbers or tzatziki  o Tuna salad on whole wheat bread: tuna salad made with greek yogurt, olives, red peppers, capers, green onions o Garlic rosemary lamb pita: lamb sauted with garlic, rosemary, salt & pepper; add lettuce, cucumber, greek yogurt to [place] -  flavor with lemon juice and black pepper  . Seafood:  o Mediterranean grilled salmon, seasoned with garlic, basil, parsley, lemon juice and black pepper o Shrimp, lemon, and spinach  whole-grain pasta salad made with low fat greek yogurt  o Seared scallops with lemon orzo  o Seared tuna steaks seasoned salt, pepper, coriander topped with tomato mixture of olives, tomatoes, olive oil, minced garlic, parsley, green onions and cappers  . Meats:  o Herbed greek chicken salad with kalamata olives, cucumber, feta  o Red bell peppers stuffed with spinach, bulgur, lean ground beef (or lentils) & topped with feta   o Kebabs: skewers of chicken, tomatoes, onions, zucchini, squash  o [place] burgers: made with red onions, mint, dill, lemon juice, feta cheese topped with roasted red peppers . Vegetarian o Cucumber salad: cucumbers, artichoke hearts, celery, red onion, feta cheese, tossed in olive oil & lemon juice  o Hummus and whole grain pita points with a greek salad (lettuce, tomato, feta, olives, cucumbers, red onion) o Lentil soup with celery, carrots made with vegetable broth, garlic, salt and pepper  o Tabouli salad: parsley, bulgur, mint, scallions, cucumbers, tomato, radishes, lemon juice, olive oil, salt and pepper.      Insomnia Insomnia is frequent trouble falling and/or staying asleep. Insomnia can be a long term problem or a short term problem. Both are common. Insomnia can be a short term problem when the wakefulness is related to a certain stress or worry. Long term insomnia is often related to ongoing stress during waking hours and/or poor sleeping habits. Overtime, sleep deprivation itself can make the problem worse. Every little thing feels more severe because you are overtired and your ability to cope is decreased. CAUSES   Stress, anxiety, and depression.  Poor sleeping habits.  Distractions such as TV in the bedroom.  Naps close to bedtime.  Engaging in emotionally charged conversations before bed.  Technical reading before sleep.  Alcohol and other sedatives. They may make the problem worse. They can hurt normal sleep patterns and normal dream  activity.  Stimulants such as caffeine for several hours prior to bedtime.  Pain syndromes and shortness of breath can cause insomnia.  Exercise late at night.  Changing time zones may cause sleeping problems (jet lag). It is sometimes helpful to have someone observe your sleeping patterns. They should look for periods of not breathing during the night (sleep apnea). They should also look to see how long those periods last. If you live alone or observers are uncertain, you can also be observed at a sleep clinic where your sleep patterns will be professionally monitored. Sleep apnea requires a checkup and treatment. Give your caregivers your medical history. Give your caregivers observations your family has made about your sleep.  SYMPTOMS   Not feeling rested in the morning.  Anxiety and restlessness at bedtime.  Difficulty falling and staying asleep. TREATMENT   Your caregiver may prescribe treatment for an underlying medical disorders. Your caregiver can give advice or help if you are using alcohol or other drugs for self-medication. Treatment of underlying problems will usually eliminate insomnia problems.  Medications can be prescribed for short time use. They are generally not recommended for lengthy use.  Over-the-counter sleep medicines are not recommended for lengthy use. They can be habit forming.  You can promote easier sleeping by making lifestyle changes such as:  Using relaxation techniques that help with breathing and reduce muscle tension.  Exercising earlier in the day.  Changing your diet  and the time of your last meal. No night time snacks.  Establish a regular time to go to bed.  Counseling can help with stressful problems and worry.  Soothing music and white noise may be helpful if there are background noises you cannot remove.  Stop tedious detailed work at least one hour before bedtime. HOME CARE INSTRUCTIONS   Keep a diary. Inform your caregiver about  your progress. This includes any medication side effects. See your caregiver regularly. Take note of:  Times when you are asleep.  Times when you are awake during the night.  The quality of your sleep.  How you feel the next day. This information will help your caregiver care for you.  Get out of bed if you are still awake after 15 minutes. Read or do some quiet activity. Keep the lights down. Wait until you feel sleepy and go back to bed.  Keep regular sleeping and waking hours. Avoid naps.  Exercise regularly.  Avoid distractions at bedtime. Distractions include watching television or engaging in any intense or detailed activity like attempting to balance the household checkbook.  Develop a bedtime ritual. Keep a familiar routine of bathing, brushing your teeth, climbing into bed at the same time each night, listening to soothing music. Routines increase the success of falling to sleep faster.  Use relaxation techniques. This can be using breathing and muscle tension release routines. It can also include visualizing peaceful scenes. You can also help control troubling or intruding thoughts by keeping your mind occupied with boring or repetitive thoughts like the old concept of counting sheep. You can make it more creative like imagining planting one beautiful flower after another in your backyard garden.  During your day, work to eliminate stress. When this is not possible use some of the previous suggestions to help reduce the anxiety that accompanies stressful situations. MAKE SURE YOU:   Understand these instructions.  Will watch your condition.  Will get help right away if you are not doing well or get worse. Document Released: 05/05/2000 Document Revised: 07/31/2011 Document Reviewed: 06/05/2007 [facility] Patient Information 2015 [place], [place]. This information is not intended to replace advice given to you by your health care provider. Make sure you discuss any questions you  have with your health care provider.     Standley Brooking. Shontelle Muska M.D.

## 2014-07-29 NOTE — Patient Instructions (Addendum)
TRACKING SLEEP INTAKE ACTIVIY IS  HELPFUL FOR GETTING BEHAVIOR CHANGE TO BE PERMANENT . Will notify you  of labs when available. Wt Readings from Last 3 Encounters:  07/29/14 246 lb 12.8 oz (111.948 kg)  07/10/14 246 lb (111.585 kg)  12/22/13 220 lb 9.6 oz (100.064 kg)    Healthy lifestyle includes : At least 150 minutes of exercise weeks  , weight at healthy levels, which is usually   BMI 19-25. Avoid trans fats and processed foods;  Increase fresh fruits and veges to 5 servings per day. And avoid sweet beverages including tea and juice. Mediterranean diet with olive oil and nuts have been noted to be heart and brain healthy . Avoid tobacco products . Limit  alcohol to  7 per week for women and 14 servings for men.  Get adequate sleep . Wear seat belts . Don't text and drive .      Why follow it? Research shows. . Those who follow the Mediterranean diet have a reduced risk of heart disease  . The diet is associated with a reduced incidence of Parkinson's and Alzheimer's diseases . People following the diet may have longer life expectancies and lower rates of chronic diseases  . The Dietary Guidelines for Americans recommends the Mediterranean diet as an eating plan to promote health and prevent disease  What Is the Mediterranean Diet?  . Healthy eating plan based on typical foods and recipes of Mediterranean-style cooking . The diet is primarily a plant based diet; these foods should make up a majority of meals   Starches - Plant based foods should make up a majority of meals - They are an important sources of vitamins, minerals, energy, antioxidants, and fiber - Choose whole grains, foods high in fiber and minimally processed items  - Typical grain sources include wheat, oats, barley, corn, brown rice, bulgar, farro, millet, polenta, couscous  - Various types of beans include chickpeas, lentils, fava beans, black beans, white beans   Fruits  Veggies - Large quantities of  antioxidant rich fruits & veggies; 6 or more servings  - Vegetables can be eaten raw or lightly drizzled with oil and cooked  - Vegetables common to the traditional Mediterranean Diet include: artichokes, arugula, beets, broccoli, brussel sprouts, cabbage, carrots, celery, collard greens, cucumbers, eggplant, kale, leeks, lemons, lettuce, mushrooms, okra, onions, peas, peppers, potatoes, pumpkin, radishes, rutabaga, shallots, spinach, sweet potatoes, turnips, zucchini - Fruits common to the Mediterranean Diet include: apples, apricots, avocados, cherries, clementines, dates, figs, grapefruits, grapes, melons, nectarines, oranges, peaches, pears, pomegranates, strawberries, tangerines  Fats - Replace butter and margarine with healthy oils, such as olive oil, canola oil, and tahini  - Limit nuts to no more than a handful a day  - Nuts include walnuts, almonds, pecans, pistachios, pine nuts  - Limit or avoid candied, honey roasted or heavily salted nuts - Olives are central to the [employer] - can be eaten whole or used in a variety of dishes   Meats Protein - Limiting red meat: no more than a few times a month - When eating red meat: choose lean cuts and keep the portion to the size of deck of cards - Eggs: approx. 0 to 4 times a week  - Fish and lean poultry: at least 2 a week  - Healthy protein sources include, chicken, [place], lean beef, lamb - Increase intake of seafood such as tuna, salmon, trout, mackerel, shrimp, scallops - Avoid or limit high fat processed meats such as sausage  and bacon  Dairy - Include moderate amounts of low fat dairy products  - Focus on healthy dairy such as fat free yogurt, skim milk, low or reduced fat cheese - Limit dairy products higher in fat such as whole or 2% milk, cheese, ice cream  Alcohol - Moderate amounts of red wine is ok  - No more than 5 oz daily for women (all ages) and men older than age 34  - No more than 10 oz of wine daily for men younger  than 63  Other - Limit sweets and other desserts  - Use herbs and spices instead of salt to flavor foods  - Herbs and spices common to the traditional Mediterranean Diet include: basil, bay leaves, chives, cloves, cumin, fennel, garlic, lavender, marjoram, mint, oregano, parsley, pepper, rosemary, sage, savory, sumac, tarragon, thyme   It's not just a diet, it's a lifestyle:  . The Mediterranean diet includes lifestyle factors typical of those in the region  . Foods, drinks and meals are best eaten with others and savored . Daily physical activity is important for overall good health . This could be strenuous exercise like running and aerobics . This could also be more leisurely activities such as walking, housework, yard-work, or taking the stairs . Moderation is the key; a balanced and healthy diet accommodates most foods and drinks . Consider portion sizes and frequency of consumption of certain foods   Meal Ideas & Options:  . Breakfast:  o Whole wheat toast or whole wheat English muffins with peanut butter & hard boiled egg o Steel cut oats topped with apples & cinnamon and skim milk  o Fresh fruit: banana, strawberries, melon, berries, peaches  o Smoothies: strawberries, bananas, greek yogurt, peanut butter o Low fat greek yogurt with blueberries and granola  o Egg white omelet with spinach and mushrooms o Breakfast couscous: whole wheat couscous, apricots, skim milk, cranberries  . Sandwiches:  o Hummus and grilled vegetables (peppers, zucchini, squash) on whole wheat bread   o Grilled chicken on whole wheat pita with lettuce, tomatoes, cucumbers or tzatziki  o Tuna salad on whole wheat bread: tuna salad made with greek yogurt, olives, red peppers, capers, green onions o Garlic rosemary lamb pita: lamb sauted with garlic, rosemary, salt & pepper; add lettuce, cucumber, greek yogurt to pita - flavor with lemon juice and black pepper  . Seafood:  o Mediterranean grilled salmon,  seasoned with garlic, basil, parsley, lemon juice and black pepper o Shrimp, lemon, and spinach whole-grain pasta salad made with low fat greek yogurt  o Seared scallops with lemon orzo  o Seared tuna steaks seasoned salt, pepper, coriander topped with tomato mixture of olives, tomatoes, olive oil, minced garlic, parsley, green onions and cappers  . Meats:  o Herbed greek chicken salad with kalamata olives, cucumber, feta  o Red bell peppers stuffed with spinach, bulgur, lean ground beef (or lentils) & topped with feta   o Kebabs: skewers of chicken, tomatoes, onions, zucchini, squash  o [place] burgers: made with red onions, mint, dill, lemon juice, feta cheese topped with roasted red peppers . Vegetarian o Cucumber salad: cucumbers, artichoke hearts, celery, red onion, feta cheese, tossed in olive oil & lemon juice  o Hummus and whole grain pita points with a greek salad (lettuce, tomato, feta, olives, cucumbers, red onion) o Lentil soup with celery, carrots made with vegetable broth, garlic, salt and pepper  o Tabouli salad: parsley, bulgur, mint, scallions, cucumbers, tomato, radishes, lemon juice, olive  oil, salt and pepper.      Insomnia Insomnia is frequent trouble falling and/or staying asleep. Insomnia can be a long term problem or a short term problem. Both are common. Insomnia can be a short term problem when the wakefulness is related to a certain stress or worry. Long term insomnia is often related to ongoing stress during waking hours and/or poor sleeping habits. Overtime, sleep deprivation itself can make the problem worse. Every little thing feels more severe because you are overtired and your ability to cope is decreased. CAUSES   Stress, anxiety, and depression.  Poor sleeping habits.  Distractions such as TV in the bedroom.  Naps close to bedtime.  Engaging in emotionally charged conversations before bed.  Technical reading before sleep.  Alcohol and other sedatives.  They may make the problem worse. They can hurt normal sleep patterns and normal dream activity.  Stimulants such as caffeine for several hours prior to bedtime.  Pain syndromes and shortness of breath can cause insomnia.  Exercise late at night.  Changing time zones may cause sleeping problems (jet lag). It is sometimes helpful to have someone observe your sleeping patterns. They should look for periods of not breathing during the night (sleep apnea). They should also look to see how long those periods last. If you live alone or observers are uncertain, you can also be observed at a sleep clinic where your sleep patterns will be professionally monitored. Sleep apnea requires a checkup and treatment. Give your caregivers your medical history. Give your caregivers observations your family has made about your sleep.  SYMPTOMS   Not feeling rested in the morning.  Anxiety and restlessness at bedtime.  Difficulty falling and staying asleep. TREATMENT   Your caregiver may prescribe treatment for an underlying medical disorders. Your caregiver can give advice or help if you are using alcohol or other drugs for self-medication. Treatment of underlying problems will usually eliminate insomnia problems.  Medications can be prescribed for short time use. They are generally not recommended for lengthy use.  Over-the-counter sleep medicines are not recommended for lengthy use. They can be habit forming.  You can promote easier sleeping by making lifestyle changes such as:  Using relaxation techniques that help with breathing and reduce muscle tension.  Exercising earlier in the day.  Changing your diet and the time of your last meal. No night time snacks.  Establish a regular time to go to bed.  Counseling can help with stressful problems and worry.  Soothing music and white noise may be helpful if there are background noises you cannot remove.  Stop tedious detailed work at least one hour  before bedtime. HOME CARE INSTRUCTIONS   Keep a diary. Inform your caregiver about your progress. This includes any medication side effects. See your caregiver regularly. Take note of:  Times when you are asleep.  Times when you are awake during the night.  The quality of your sleep.  How you feel the next day. This information will help your caregiver care for you.  Get out of bed if you are still awake after 15 minutes. Read or do some quiet activity. Keep the lights down. Wait until you feel sleepy and go back to bed.  Keep regular sleeping and waking hours. Avoid naps.  Exercise regularly.  Avoid distractions at bedtime. Distractions include watching television or engaging in any intense or detailed activity like attempting to balance the household checkbook.  Develop a bedtime ritual. Keep a familiar routine of  bathing, brushing your teeth, climbing into bed at the same time each night, listening to soothing music. Routines increase the success of falling to sleep faster.  Use relaxation techniques. This can be using breathing and muscle tension release routines. It can also include visualizing peaceful scenes. You can also help control troubling or intruding thoughts by keeping your mind occupied with boring or repetitive thoughts like the old concept of counting sheep. You can make it more creative like imagining planting one beautiful flower after another in your backyard garden.  During your day, work to eliminate stress. When this is not possible use some of the previous suggestions to help reduce the anxiety that accompanies stressful situations. MAKE SURE YOU:   Understand these instructions.  Will watch your condition.  Will get help right away if you are not doing well or get worse. Document Released: 05/05/2000 Document Revised: 07/31/2011 Document Reviewed: 06/05/2007 [facility] Patient Information 2015 [place], [place]. This information is not intended to replace  advice given to you by your health care provider. Make sure you discuss any questions you have with your health care provider.

## 2014-09-14 ENCOUNTER — Encounter: Payer: Self-pay | Admitting: Nurse Practitioner

## 2014-09-14 ENCOUNTER — Ambulatory Visit (INDEPENDENT_AMBULATORY_CARE_PROVIDER_SITE_OTHER): Payer: Managed Care, Other (non HMO) | Admitting: Nurse Practitioner

## 2014-09-14 VITALS — BP 108/79 | HR 88 | Temp 99.1°F | Ht 64.75 in

## 2014-09-14 DIAGNOSIS — J029 Acute pharyngitis, unspecified: Secondary | ICD-10-CM

## 2014-09-14 DIAGNOSIS — R6889 Other general symptoms and signs: Secondary | ICD-10-CM

## 2014-09-14 LAB — POCT RAPID STREP A (OFFICE): Rapid Strep A Screen: NEGATIVE

## 2014-09-14 NOTE — Patient Instructions (Signed)
This is likely a viral sore throat/upper respiratory infection. However, if your culture comes back growing bacteria, I will call in an antibiotic. In the meantime, you may use benzocaine throat lozenges or throat spray for comfort. Salt water gargles (1/4 cup warm water mixed with 1/4 tsp salt) twice daily & listerene gargles twice daily. If you develop runny nose, start Neilmed sinus rinses daily.  Rest, sip fluids every hour.  Feel better!  Sore Throat A sore throat is pain, burning, irritation, or scratchiness of the throat. There is often pain or tenderness when swallowing or talking. A sore throat may be accompanied by other symptoms, such as coughing, sneezing, fever, and swollen neck glands. A sore throat is often the first sign of another sickness, such as a cold, flu, strep throat, or mononucleosis (commonly known as mono). Most sore throats go away without medical treatment. CAUSES  The most common causes of a sore throat include:  A viral infection, such as a cold, flu, or mono.  A bacterial infection, such as strep throat, tonsillitis, or whooping cough.  Seasonal allergies.  Dryness in the air.  Irritants, such as smoke or pollution.  Gastroesophageal reflux disease (GERD). HOME CARE INSTRUCTIONS   Only take over-the-counter medicines as directed by your caregiver.  Drink enough fluids to keep your urine clear or pale yellow.  Rest as needed.  Try using throat sprays, lozenges, or sucking on hard candy to ease any pain (if older than 4 years or as directed).  Sip warm liquids, such as broth, herbal tea, or warm water with honey to relieve pain temporarily. You may also eat or drink cold or frozen liquids such as frozen ice pops.  Gargle with salt water (mix 1 tsp salt with 8 oz of water).  Do not smoke and avoid secondhand smoke.  Put a cool-mist humidifier in your bedroom at night to moisten the air. You can also turn on a hot shower and sit in the bathroom with the  door closed for 5 10 minutes. SEEK IMMEDIATE MEDICAL CARE IF:  You have difficulty breathing.  You are unable to swallow fluids, soft foods, or your saliva.  You have increased swelling in the throat.  Your sore throat does not get better in 7 days.  You have nausea and vomiting.  You have a fever or persistent symptoms for more than 2 3 days.  You have a fever and your symptoms suddenly get worse. MAKE SURE YOU:   Understand these instructions.  Will watch your condition.  Will get help right away if you are not doing well or get worse. Document Released: 06/15/2004 Document Revised: 04/24/2012 Document Reviewed: 01/14/2012 [employer] Patient Information 2014 [employer]. 

## 2014-09-14 NOTE — Progress Notes (Signed)
   Subjective:    Patient ID: Tricia [name], female    DOB: [birth year], 54 y.o.   MRN: [id number]  Sore Throat  This is a new problem. The current episode started in the past 7 days. The problem has been gradually worsening. Neither side of throat is experiencing more pain than the other. Maximum temperature: chills. The pain is moderate. Associated symptoms include congestion, diarrhea, ear pain, headaches and swollen glands. Pertinent negatives include no abdominal pain, coughing, hoarse voice, plugged ear sensation, shortness of breath or trouble swallowing. Treatments tried: mucinex d. The treatment provided mild relief.      Review of Systems  HENT: Positive for congestion and ear pain. Negative for hoarse voice and trouble swallowing.   Respiratory: Negative for cough and shortness of breath.   Gastrointestinal: Positive for diarrhea. Negative for abdominal pain.  Neurological: Positive for headaches.       Objective:   Physical Exam  Constitutional: She is oriented to person, place, and time. She appears well-developed and well-nourished. No distress.  HENT:  Head: Normocephalic and atraumatic.  Right Ear: External ear normal.  Left Ear: External ear normal.  Mouth/Throat: No oropharyngeal exudate.  Posterior pharynx erythematous   Eyes: Conjunctivae are normal. Right eye exhibits no discharge. Left eye exhibits no discharge.  Neck: Normal range of motion. Neck supple.  Cardiovascular: Regular rhythm and normal heart sounds.   No murmur heard. Pulmonary/Chest: Effort normal and breath sounds normal.  Neurological: She is alert and oriented to person, place, and time.  Skin: Skin is warm and dry.  Psychiatric: She has a normal mood and affect. Her behavior is normal. Thought content normal.  Vitals reviewed.         Assessment & Plan:  1. Sore throat - POCT rapid strep A - Upper Respiratory Culture  2. Flu-like symptoms Pt doesn't want to take  tamiflu.  discussed symptom management See pt instructions F/u prn

## 2014-09-14 NOTE — Progress Notes (Signed)
Pre visit review using our clinic review tool, if applicable. No additional management support is needed unless otherwise documented below in the visit note. 

## 2014-09-16 ENCOUNTER — Telehealth: Payer: Self-pay | Admitting: Nurse Practitioner

## 2014-09-16 DIAGNOSIS — J020 Streptococcal pharyngitis: Secondary | ICD-10-CM

## 2014-09-16 LAB — CULTURE, UPPER RESPIRATORY

## 2014-09-16 MED ORDER — AMOXICILLIN 500 MG PO CAPS: 500.0000 mg | ORAL_CAPSULE | Freq: Two times a day (BID) | ORAL | Status: DC

## 2014-09-16 NOTE — Telephone Encounter (Signed)
pls call pt: Advise Throat culture is pos for strep bacteria. Start antibiotic.Eat yogurt daily to help prevent antibiotic -associated diarrhea.  Gargle with salt water several times daily (1/4 tsp salt mixed with 1/4 cup warm water).  Listerene gargles twice daily.  Continue benxocaine throat lozenge for comfort. Change tooth brush in 24 hours of starting antibiotic.

## 2014-09-16 NOTE — Telephone Encounter (Signed)
Called and informed patient of results.

## 2014-11-24 ENCOUNTER — Encounter: Payer: Self-pay | Admitting: Gastroenterology

## 2014-12-11 ENCOUNTER — Encounter: Payer: Self-pay | Admitting: Gastroenterology

## 2014-12-23 ENCOUNTER — Encounter: Payer: Self-pay | Admitting: Gastroenterology

## 2015-02-22 ENCOUNTER — Telehealth: Payer: Self-pay | Admitting: Gastroenterology

## 2015-03-08 ENCOUNTER — Encounter: Payer: Managed Care, Other (non HMO) | Admitting: Gastroenterology

## 2015-03-31 ENCOUNTER — Ambulatory Visit (INDEPENDENT_AMBULATORY_CARE_PROVIDER_SITE_OTHER): Payer: Managed Care, Other (non HMO) | Admitting: Family Medicine

## 2015-03-31 DIAGNOSIS — Z23 Encounter for immunization: Secondary | ICD-10-CM | POA: Diagnosis not present

## 2015-04-01 ENCOUNTER — Ambulatory Visit (AMBULATORY_SURGERY_CENTER): Payer: Self-pay

## 2015-04-01 VITALS — Ht 65.0 in | Wt 274.4 lb

## 2015-04-01 DIAGNOSIS — Z860100 Personal history of colon polyps, unspecified: Secondary | ICD-10-CM

## 2015-04-01 MED ORDER — SUPREP BOWEL PREP KIT 17.5-3.13-1.6 GM/177ML PO SOLN: 1.0000 | Freq: Once | ORAL | Status: DC

## 2015-04-01 NOTE — Progress Notes (Signed)
No allergies to eggs or soy No home oxygen No diet/weight loss meds No past problems with anesthesia  Has email and internet; refused emmi

## 2015-04-08 ENCOUNTER — Encounter: Payer: Self-pay | Admitting: Gastroenterology

## 2015-04-12 ENCOUNTER — Telehealth: Payer: Self-pay | Admitting: Gastroenterology

## 2015-04-12 NOTE — Telephone Encounter (Signed)
Spoke with the patient. She has had the cough for a week. She feels she is improving. She has not had any fever, chills or body aches. She will contact her PCP to be certain. Otherwise, plan for the colonoscopy. Is this okay with you?

## 2015-04-13 NOTE — Telephone Encounter (Signed)
Ok. Thanks!

## 2015-04-14 ENCOUNTER — Encounter: Payer: Self-pay | Admitting: Gastroenterology

## 2015-04-14 ENCOUNTER — Ambulatory Visit (AMBULATORY_SURGERY_CENTER): Payer: Managed Care, Other (non HMO) | Admitting: Gastroenterology

## 2015-04-14 VITALS — BP 128/75 | HR 70 | Temp 97.5°F | Resp 18 | Ht 65.0 in | Wt 274.0 lb

## 2015-04-14 DIAGNOSIS — K635 Polyp of colon: Secondary | ICD-10-CM | POA: Diagnosis not present

## 2015-04-14 DIAGNOSIS — D124 Benign neoplasm of descending colon: Secondary | ICD-10-CM

## 2015-04-14 DIAGNOSIS — D123 Benign neoplasm of transverse colon: Secondary | ICD-10-CM

## 2015-04-14 DIAGNOSIS — Z800 Family history of malignant neoplasm of digestive organs: Secondary | ICD-10-CM

## 2015-04-14 DIAGNOSIS — Z86010 Personal history of colonic polyps: Secondary | ICD-10-CM

## 2015-04-14 MED ORDER — SODIUM CHLORIDE 0.9 % IV SOLN: 500.0000 mL | INTRAVENOUS | Status: DC

## 2015-04-14 NOTE — Patient Instructions (Signed)
YOU HAD AN ENDOSCOPIC PROCEDURE TODAY AT [facility]:   Refer to the procedure report that was given to you for any specific questions about what was found during the examination.  If the procedure report does not answer your questions, please call your gastroenterologist to clarify.  If you requested that your care partner not be given the details of your procedure findings, then the procedure report has been included in a sealed envelope for you to review at your convenience later.  YOU SHOULD EXPECT: Some feelings of bloating in the abdomen. Passage of more gas than usual.  Walking can help get rid of the air that was put into your GI tract during the procedure and reduce the bloating. If you had a lower endoscopy (such as a colonoscopy or flexible sigmoidoscopy) you may notice spotting of blood in your stool or on the toilet paper. If you underwent a bowel prep for your procedure, you may not have a normal bowel movement for a few days.  Please Note:  You might notice some irritation and congestion in your nose or some drainage.  This is from the oxygen used during your procedure.  There is no need for concern and it should clear up in a day or so.  SYMPTOMS TO REPORT IMMEDIATELY:   Following lower endoscopy (colonoscopy or flexible sigmoidoscopy):  Excessive amounts of blood in the stool  Significant tenderness or worsening of abdominal pains  Swelling of the abdomen that is new, acute  Fever of 100F or higher  For urgent or emergent issues, a gastroenterologist can be reached at any hour by calling [phone].   DIET: Your first meal following the procedure should be a small meal and then it is ok to progress to your normal diet. Heavy or fried foods are harder to digest and may make you feel nauseous or bloated.  Likewise, meals heavy in dairy and vegetables can increase bloating.  Drink plenty of fluids but you should avoid alcoholic beverages for 24  hours.  ACTIVITY:  You should plan to take it easy for the rest of today and you should NOT DRIVE or use heavy machinery until tomorrow (because of the sedation medicines used during the test).    FOLLOW UP: Our staff will call the number listed on your records the next business day following your procedure to check on you and address any questions or concerns that you may have regarding the information given to you following your procedure. If we do not reach you, we will leave a message.  However, if you are feeling well and you are not experiencing any problems, there is no need to return our call.  We will assume that you have returned to your regular daily activities without incident.  If any biopsies were taken you will be contacted by phone or by letter within the next 1-3 weeks.  Please call us at [phone] if you have not heard about the biopsies in 3 weeks.    SIGNATURES/CONFIDENTIALITY: You and/or your care partner have signed paperwork which will be entered into your electronic medical record.  These signatures attest to the fact that that the information above on your After Visit Summary has been reviewed and is understood.  Full responsibility of the confidentiality of this discharge information lies with you and/or your care-partner.  Next colonoscopy in 5 years. Please review polyp and hemorrhoid handout provided.

## 2015-04-14 NOTE — Op Note (Signed)
[place]  [address] [place], [address]   COLONOSCOPY PROCEDURE REPORT  PATIENT: Tricia [name], Tricia [name]  MR#: [id number] BIRTHDATE: [birth year] , 54  yrs. old GENDER: female ENDOSCOPIST: Harl Bowie, MD REFERRED [id number] Darnelle Going, M.D. PROCEDURE DATE:  04/14/2015 PROCEDURE:   Colonoscopy with cold biopsy polypectomy, Colonoscopy with snare polypectomy, and Colonoscopy, surveillance First Screening Colonoscopy - Avg.  risk and is 50 yrs.  old or older - No.  Prior Negative Screening - Now for repeat screening. N/A  History of Adenoma - Now for follow-up colonoscopy & has been > or = to 3 yrs.  Yes hx of adenoma.  Has been 3 or more years since last colonoscopy.  Polyps removed today? Yes ASA CLASS:   Class II INDICATIONS:Surveillance due to prior colonic neoplasia, PH Colon Adenoma, and FH Colon or Rectal Adenocarcinoma. MEDICATIONS: Propofol 350 mg IV  DESCRIPTION OF PROCEDURE:   After the risks benefits and alternatives of the procedure were thoroughly explained, informed consent was obtained.  The digital rectal exam revealed no abnormalities of the rectum.   The LB [id number]-[id number]  endoscope was introduced through the anus and advanced to the cecum, which was identified by both the appendix and ileocecal valve. No adverse events experienced.   The quality of the prep was good.  The instrument was then slowly withdrawn as the colon was fully examined. Estimated blood loss is zero unless otherwise noted in this procedure report.      COLON FINDINGS: A sessile polyp ranging between 3-80mm in size was found in the transverse colon.  A biopsy was performed using cold forceps.  A polypectomy was performed with a cold snare.  The resection was complete, the polyp tissue was completely retrieved and sent to histology.   Small external and internal hemorrhoids were found.  Retroflexed views revealed internal hemorrhoids. The time to cecum = 2.5 Withdrawal  time = 15.3   The scope was withdrawn and the procedure completed. COMPLICATIONS: There were no immediate complications.  ENDOSCOPIC IMPRESSION: 1.   Sessile polyp ranging between 3-14mm in size was found in the transverse colon; biopsy was performed using cold forceps; sessile polyp in descending colon 5-35mm in size polypectomy was performed with a cold snare 2.   Small external and internal hemorrhoids  RECOMMENDATIONS: If the polyp(s) removed today are proven to be adenomatous (pre-cancerous) polyps, you will need a repeat colonoscopy in 5 years.  eSigned:  Harl Bowie, MD 04/14/2015 9:11 AM

## 2015-04-14 NOTE — Progress Notes (Signed)
Patient denies any allergies to eggs or soy. 

## 2015-04-14 NOTE — Progress Notes (Signed)
A/ox3 pleased with MAC, report to Wendy RN 

## 2015-04-14 NOTE — Progress Notes (Signed)
Called to room to assist during endoscopic procedure.  Patient ID and intended procedure confirmed with present staff. Received instructions for my participation in the procedure from the performing physician.  

## 2015-04-19 ENCOUNTER — Telehealth: Payer: Self-pay | Admitting: *Deleted

## 2015-04-19 NOTE — Telephone Encounter (Signed)
No answer, left message to call if questions or concerns. 

## 2015-04-29 ENCOUNTER — Encounter: Payer: Self-pay | Admitting: Gastroenterology

## 2015-07-22 ENCOUNTER — Encounter: Payer: Self-pay | Admitting: Internal Medicine

## 2015-07-22 ENCOUNTER — Ambulatory Visit (INDEPENDENT_AMBULATORY_CARE_PROVIDER_SITE_OTHER): Payer: BLUE CROSS/BLUE SHIELD | Admitting: Internal Medicine

## 2015-07-22 VITALS — BP 100/70 | Temp 97.9°F | Wt 276.6 lb

## 2015-07-22 DIAGNOSIS — E669 Obesity, unspecified: Secondary | ICD-10-CM | POA: Diagnosis not present

## 2015-07-22 DIAGNOSIS — E785 Hyperlipidemia, unspecified: Secondary | ICD-10-CM

## 2015-07-22 DIAGNOSIS — R0789 Other chest pain: Secondary | ICD-10-CM

## 2015-07-22 DIAGNOSIS — Z0000 Encounter for general adult medical examination without abnormal findings: Secondary | ICD-10-CM

## 2015-07-22 DIAGNOSIS — Z23 Encounter for immunization: Secondary | ICD-10-CM | POA: Diagnosis not present

## 2015-07-22 DIAGNOSIS — Z0001 Encounter for general adult medical examination with abnormal findings: Secondary | ICD-10-CM

## 2015-07-22 DIAGNOSIS — G43909 Migraine, unspecified, not intractable, without status migrainosus: Secondary | ICD-10-CM

## 2015-07-22 DIAGNOSIS — G479 Sleep disorder, unspecified: Secondary | ICD-10-CM

## 2015-07-22 DIAGNOSIS — D320 Benign neoplasm of cerebral meninges: Secondary | ICD-10-CM

## 2015-07-22 DIAGNOSIS — Z3041 Encounter for surveillance of contraceptive pills: Secondary | ICD-10-CM

## 2015-07-22 DIAGNOSIS — D329 Benign neoplasm of meninges, unspecified: Secondary | ICD-10-CM | POA: Diagnosis not present

## 2015-07-22 LAB — LIPID PANEL
CHOLESTEROL: 224 mg/dL — AB (ref 0–200)
HDL: 52.9 mg/dL (ref >39.00)
LDL CALC: 136 mg/dL — AB (ref 0–99)
NonHDL: 171.25
Total CHOL/HDL Ratio: 4
Triglycerides: 178 mg/dL — ABNORMAL HIGH (ref 0.0–149.0)
VLDL: 35.6 mg/dL (ref 0.0–40.0)

## 2015-07-22 LAB — CBC WITH DIFFERENTIAL/PLATELET
BASOS PCT: 0.7 % (ref 0.0–3.0)
Basophils Absolute: 0 10*3/uL (ref 0.0–0.1)
EOS ABS: 0.2 10*3/uL (ref 0.0–0.7)
Eosinophils Relative: 3.4 % (ref 0.0–5.0)
HCT: 44.6 % (ref 36.0–46.0)
Hemoglobin: 14.6 g/dL (ref 12.0–15.0)
LYMPHS ABS: 1.7 10*3/uL (ref 0.7–4.0)
Lymphocytes Relative: 23.2 % (ref 12.0–46.0)
MCHC: 32.6 g/dL (ref 30.0–36.0)
MCV: 87.2 fl (ref 78.0–100.0)
MONO ABS: 0.5 10*3/uL (ref 0.1–1.0)
Monocytes Relative: 6.7 % (ref 3.0–12.0)
NEUTROS ABS: 4.7 10*3/uL (ref 1.4–7.7)
NEUTROS PCT: 66 % (ref 43.0–77.0)
PLATELETS: 264 10*3/uL (ref 150.0–400.0)
RBC: 5.12 Mil/uL — ABNORMAL HIGH (ref 3.87–5.11)
RDW: 13.9 % (ref 11.5–15.5)
WBC: 7.1 10*3/uL (ref 4.0–10.5)

## 2015-07-22 LAB — FOLLICLE STIMULATING HORMONE: FSH: 78 m[IU]/mL

## 2015-07-22 LAB — BASIC METABOLIC PANEL
BUN: 14 mg/dL (ref 6–23)
CHLORIDE: 104 meq/L (ref 96–112)
CO2: 30 mEq/L (ref 19–32)
CREATININE: 0.85 mg/dL (ref 0.40–1.20)
Calcium: 9.5 mg/dL (ref 8.4–10.5)
GFR: 73.26 mL/min (ref >60.00)
GLUCOSE: 93 mg/dL (ref 70–99)
Potassium: 5.2 mEq/L — ABNORMAL HIGH (ref 3.5–5.1)
Sodium: 140 mEq/L (ref 135–145)

## 2015-07-22 LAB — TSH: TSH: 1.29 u[IU]/mL (ref 0.35–4.50)

## 2015-07-22 LAB — HEPATIC FUNCTION PANEL
ALT: 14 U/L (ref 0–35)
AST: 13 U/L (ref 0–37)
Albumin: 4.3 g/dL (ref 3.5–5.2)
Alkaline Phosphatase: 90 U/L (ref 39–117)
BILIRUBIN DIRECT: 0.1 mg/dL (ref 0.0–0.3)
BILIRUBIN TOTAL: 0.4 mg/dL (ref 0.2–1.2)
Total Protein: 7.1 g/dL (ref 6.0–8.3)

## 2015-07-22 MED ORDER — SUMATRIPTAN SUCCINATE 100 MG PO TABS: 100.0000 mg | ORAL_TABLET | Freq: Once | ORAL | Status: DC | PRN

## 2015-07-22 NOTE — Patient Instructions (Signed)
Intensify lifestyle interventions.\ Attend to  Sleep hygiene . ROV in 6 weeks with  Weight control healthy eating plan . consideration of  Phentermine.    talks with  Tricia [name] about  [place] [facility], Female Adopting a healthy lifestyle and getting preventive care can go a long way to promote health and wellness. Talk with your health care provider about what schedule of regular examinations is right for you. This is a good chance for you to check in with your provider about disease prevention and staying healthy. In between checkups, there are plenty of things you can do on your own. Experts have done a lot of research about which lifestyle changes and preventive measures are most likely to keep you healthy. Ask your health care provider for more information. WEIGHT AND DIET  Eat a healthy diet  Be sure to include plenty of vegetables, fruits, low-fat dairy products, and lean protein.  Do not eat a lot of foods high in solid fats, added sugars, or salt.  Get regular exercise. This is one of the most important things you can do for your health.  Most adults should exercise for at least 150 minutes each week. The exercise should increase your heart rate and make you sweat (moderate-intensity exercise).  Most adults should also do strengthening exercises at least twice a week. This is in addition to the moderate-intensity exercise.  Maintain a healthy weight  Body mass index (BMI) is a measurement that can be used to identify possible weight problems. It estimates body fat based on height and weight. Your health care provider can help determine your BMI and help you achieve or maintain a healthy weight.  For females 57 years of age and older:   A BMI below 18.5 is considered underweight.  A BMI of 18.5 to 24.9 is normal.  A BMI of 25 to 29.9 is considered overweight.  A BMI of 30 and above is considered obese.  Watch levels of cholesterol and blood lipids  You should  start having your blood tested for lipids and cholesterol at 57 years of age, then have this test every 5 years.  You may need to have your cholesterol levels checked more often if:  Your lipid or cholesterol levels are high.  You are older than 57 years of age.  You are at high risk for heart disease.  CANCER SCREENING   Lung Cancer  Lung cancer screening is recommended for adults 57-81 years old who are at high risk for lung cancer because of a history of smoking.  A yearly low-dose CT scan of the lungs is recommended for people who:  Currently smoke.  Have quit within the past 15 years.  Have at least a 30-pack-year history of smoking. A pack year is smoking an average of one pack of cigarettes a day for 1 year.  Yearly screening should continue until it has been 15 years since you quit.  Yearly screening should stop if you develop a health problem that would prevent you from having lung cancer treatment.  Breast Cancer  Practice breast self-awareness. This means understanding how your breasts normally appear and feel.  It also means doing regular breast self-exams. Let your health care provider know about any changes, no matter how small.  If you are in your 20s or 30s, you should have a clinical breast exam (CBE) by a health care provider every 1-3 years as part of a regular health exam.  If you are 40  or older, have a CBE every year. Also consider having a breast X-ray (mammogram) every year.  If you have a family history of breast cancer, talk to your health care provider about genetic screening.  If you are at high risk for breast cancer, talk to your health care provider about having an MRI and a mammogram every year.  Breast cancer gene (BRCA) assessment is recommended for women who have family members with BRCA-related cancers. BRCA-related cancers include:  Breast.  Ovarian.  Tubal.  Peritoneal cancers.  Results of the assessment will determine the  need for genetic counseling and BRCA1 and BRCA2 testing. Cervical Cancer Your health care provider may recommend that you be screened regularly for cancer of the pelvic organs (ovaries, uterus, and vagina). This screening involves a pelvic examination, including checking for microscopic changes to the surface of your cervix (Pap test). You may be encouraged to have this screening done every 3 years, beginning at age 21.  For women ages 30-65, health care providers may recommend pelvic exams and Pap testing every 3 years, or they may recommend the Pap and pelvic exam, combined with testing for human papilloma virus (HPV), every 5 years. Some types of HPV increase your risk of cervical cancer. Testing for HPV may also be done on women of any age with unclear Pap test results.  Other health care providers may not recommend any screening for nonpregnant women who are considered low risk for pelvic cancer and who do not have symptoms. Ask your health care provider if a screening pelvic exam is right for you.  If you have had past treatment for cervical cancer or a condition that could lead to cancer, you need Pap tests and screening for cancer for at least 20 years after your treatment. If Pap tests have been discontinued, your risk factors (such as having a new sexual partner) need to be reassessed to determine if screening should resume. Some women have medical problems that increase the chance of getting cervical cancer. In these cases, your health care provider may recommend more frequent screening and Pap tests. Colorectal Cancer  This type of cancer can be detected and often prevented.  Routine colorectal cancer screening usually begins at 57 years of age and continues through 57 years of age.  Your health care provider may recommend screening at an earlier age if you have risk factors for colon cancer.  Your health care provider may also recommend using home test kits to check for hidden blood in  the stool.  A small camera at the end of a tube can be used to examine your colon directly (sigmoidoscopy or colonoscopy). This is done to check for the earliest forms of colorectal cancer.  Routine screening usually begins at age 50.  Direct examination of the colon should be repeated every 5-10 years through 57 years of age. However, you may need to be screened more often if early forms of precancerous polyps or small growths are found. Skin Cancer  Check your skin from head to toe regularly.  Tell your health care provider about any new moles or changes in moles, especially if there is a change in a mole's shape or color.  Also tell your health care provider if you have a mole that is larger than the size of a pencil eraser.  Always use sunscreen. Apply sunscreen liberally and repeatedly throughout the day.  Protect yourself by wearing long sleeves, pants, a wide-brimmed hat, and sunglasses whenever you are outside. HEART DISEASE,   DIABETES, AND HIGH BLOOD PRESSURE   High blood pressure causes heart disease and increases the risk of stroke. High blood pressure is more likely to develop in:  People who have blood pressure in the high end of the normal range (130-139/85-89 mm Hg).  People who are overweight or obese.  People who are African American.  If you are 18-39 years of age, have your blood pressure checked every 3-5 years. If you are 40 years of age or older, have your blood pressure checked every year. You should have your blood pressure measured twice--once when you are at a hospital or clinic, and once when you are not at a hospital or clinic. Record the average of the two measurements. To check your blood pressure when you are not at a hospital or clinic, you can use:  An automated blood pressure machine at a pharmacy.  A home blood pressure monitor.  If you are between 55 years and 79 years old, ask your health care provider if you should take aspirin to prevent  strokes.  Have regular diabetes screenings. This involves taking a blood sample to check your fasting blood sugar level.  If you are at a normal weight and have a low risk for diabetes, have this test once every three years after 57 years of age.  If you are overweight and have a high risk for diabetes, consider being tested at a younger age or more often. PREVENTING INFECTION  Hepatitis B  If you have a higher risk for hepatitis B, you should be screened for this virus. You are considered at high risk for hepatitis B if:  You were born in a country where hepatitis B is common. Ask your health care provider which countries are considered high risk.  Your parents were born in a high-risk country, and you have not been immunized against hepatitis B (hepatitis B vaccine).  You have HIV or AIDS.  You use needles to inject street drugs.  You live with someone who has hepatitis B.  You have had sex with someone who has hepatitis B.  You get hemodialysis treatment.  You take certain medicines for conditions, including cancer, organ transplantation, and autoimmune conditions. Hepatitis C  Blood testing is recommended for:  Everyone born from 1945 through 1965.  Anyone with known risk factors for hepatitis C. Sexually transmitted infections (STIs)  You should be screened for sexually transmitted infections (STIs) including gonorrhea and chlamydia if:  You are sexually active and are younger than 57 years of age.  You are older than 57 years of age and your health care provider tells you that you are at risk for this type of infection.  Your sexual activity has changed since you were last screened and you are at an increased risk for chlamydia or gonorrhea. Ask your health care provider if you are at risk.  If you do not have HIV, but are at risk, it may be recommended that you take a prescription medicine daily to prevent HIV infection. This is called pre-exposure prophylaxis  (PrEP). You are considered at risk if:  You are sexually active and do not regularly use condoms or know the HIV status of your partner(s).  You take drugs by injection.  You are sexually active with a partner who has HIV. Talk with your health care provider about whether you are at high risk of being infected with HIV. If you choose to begin PrEP, you should first be tested for HIV. You should then   be tested every 3 months for as long as you are taking PrEP.  PREGNANCY   If you are premenopausal and you may become pregnant, ask your health care provider about preconception counseling.  If you may become pregnant, take 400 to 800 micrograms (mcg) of folic acid every day.  If you want to prevent pregnancy, talk to your health care provider about birth control (contraception). OSTEOPOROSIS AND MENOPAUSE   Osteoporosis is a disease in which the bones lose minerals and strength with aging. This can result in serious bone fractures. Your risk for osteoporosis can be identified using a bone density scan.  If you are 70 years of age or older, or if you are at risk for osteoporosis and fractures, ask your health care provider if you should be screened.  Ask your health care provider whether you should take a calcium or vitamin D supplement to lower your risk for osteoporosis.  Menopause may have certain physical symptoms and risks.  Hormone replacement therapy may reduce some of these symptoms and risks. Talk to your health care provider about whether hormone replacement therapy is right for you.  HOME CARE INSTRUCTIONS   Schedule regular health, dental, and eye exams.  Stay current with your immunizations.   Do not use any tobacco products including cigarettes, chewing tobacco, or electronic cigarettes.  If you are pregnant, do not drink alcohol.  If you are breastfeeding, limit how much and how often you drink alcohol.  Limit alcohol intake to no more than 1 drink per day for  nonpregnant women. One drink equals 12 ounces of beer, 5 ounces of wine, or 1 ounces of hard liquor.  Do not use street drugs.  Do not share needles.  Ask your health care provider for help if you need support or information about quitting drugs.  Tell your health care provider if you often feel depressed.  Tell your health care provider if you have ever been abused or do not feel safe at home.   This information is not intended to replace advice given to you by your health care provider. Make sure you discuss any questions you have with your health care provider.   Document Released: 11/21/2010 Document Revised: 05/29/2014 Document Reviewed: 04/09/2013 Elsevier Interactive Patient Education [employer].

## 2015-07-22 NOTE — Progress Notes (Signed)
Chief Complaint  Patient presents with  . Annual Exam    ocass cp    HPI: Patient  Tricia [name]  55 y.o. comes in today for Preventive Health Care visit   Sees nudelman  No change  Mri every 5 years  On phenobarb   To start new job has gained weight back   Asks about anorectics     ocass noct not exercise assoc luq sharp chest pain no sweat nv  sob sweats cough   Is on  ocps per gyne  Mom had periods to age 56   No wd bleeding   ocass hot flush  Will disc with gyne at next visit .? Do hormone level?  Please refill imitrex  Gets ha about 2 x per month  Snores when on back no fam hx osa some awakening with dry mouth   Health Maintenance  Topic Date Due  . INFLUENZA VACCINE  12/21/2015  . MAMMOGRAM  06/18/2016  . PAP SMEAR  07/02/2016  . COLONOSCOPY  04/13/2020  . TETANUS/TDAP  02/07/2021  . Hepatitis C Screening  Completed  . HIV Screening  Completed   Health Maintenance Review LIFESTYLE:  Exercise:   Keeping up with grandkids   Off work . And ow on Monday  [employer] 40 hours  Tobacco/ETS:  n Alcohol: n Sugar beverages: 3 per week.  Sleep: about 5 hours .    Never slept good.  Drug use: no HH  of 1  1 dog  ROS:  GEN/ HEENT: No fever, significant weight changes sweats headaches vision problems hearing changes, CV/ PULM; No chest pain shortness of breath cough, syncope,edema  change in exercise tolerance. GI /GU: No adominal pain, vomiting, change in bowel habits. No blood in the stool. No significant GU symptoms. SKIN/HEME: ,no acute skin rashes suspicious lesions or bleeding. No lymphadenopathy, nodules, masses.  NEURO/ PSYCH:  No neurologic signs such as weakness numbness. No depression anxiety. IMM/ Allergy: No unusual infections.  Allergy .   REST of 12 system review negative except as per HPI   Past Medical History  Diagnosis Date  . MVP (mitral valve prolapse)     echo 02 normal  . Jaundice due to hepatitis     hx when younger unknown cause  resolved   . Glucosuria     with normal blood sugar  . Meningioma ([place])   . Migraine headache     some aura  period related  imitrex helps  . Colon polyps 2006, 2011    TUBULAR ADENOMA AND HYPERPLASTIC POLYP  . Seizures ([place])   . HLD (hyperlipidemia)   . Hepatitis C aby neg viral copies     serology positive but neg viral copies   . Esophageal stricture 2012  . Ulcer 2012    Past Surgical History  Procedure Laterality Date  . Appendectomy    . Tubal ligation    . Frontal meningioma resected  1998  . Cin d&c conization  2000  . Bunionectomy      laft  . Left shoulder dislocation surgery  2005  . Colonoscopy w/ biopsies  2011    SEVERAL   . Esophagogastroduodenoscopy  2012  . Cholecystectomy  2015    Dr Johney Maine    Family History  Problem Relation Age of Onset  . Colon cancer Father   . Colon cancer Paternal Aunt   . Colon cancer      both GMs   . Colon cancer Paternal Uncle   .  Hyperlipidemia Father   . Other Brother     transient global amnesia  . Crohn's disease Daughter   . Coronary artery disease Father 74    MI  . Coronary artery disease Maternal Uncle 74  . Diabetes Father     elderly  . Colon cancer Mother     Social History   Social History  . Marital Status: Legally Separated    Spouse Name: N/A  . Number of Children: 3  . Years of Education: N/A   Occupational History  . accounting [employer]   Social History Main Topics  . Smoking status: Former Smoker -- 1.50 packs/day    Types: Cigarettes    Quit date: 05/22/1990  . Smokeless tobacco: Never Used  . Alcohol Use: Yes     Comment: occas  . Drug Use: No  . Sexual Activity: Not Asked   Other Topics Concern  . 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 Aspirin-Salicylamide-Caffeine (BC HEADACHE POWDER PO) Take 1 packet by mouth as needed.    . cholecalciferol (VITAMIN D) 1000 UNITS tablet Take 1,000 Units by mouth daily.      . Cyanocobalamin (VITAMIN B 12 PO) Take 1 tablet by mouth daily.      . Estradiol-Norethindrone Acet 0.5-0.1 MG per tablet Take by mouth daily.    . Multiple Vitamin (MULTIVITAMIN PO) Take 1 tablet by mouth daily.      . OMEGA-3 1000 MG CAPS Take 1 capsule by mouth daily.      Marland Kitchen PHENobarbital (LUMINAL) 97.2 MG tablet Take 97.2 mg by mouth at bedtime.     . SUMAtriptan (IMITREX) 100 MG tablet Take 1 tablet (100 mg total) by mouth once as needed for migraine (can repeat in 2 hours ). 9 tablet 3   No facility-administered medications prior to visit.     EXAM:  BP 100/70 mmHg  Temp(Src) 97.9 F (36.6 C) (Oral)  Wt 276 lb 9.6 oz (125.465 kg)  LMP 12/21/2010  Body mass index is 46.03 kg/(m^2).  Physical Exam: Vital signs reviewed BSW:HQPR is a well-developed well-nourished alert cooperative    who appearsr stated age in no acute distress.  HEENT: normocephalic atraumatic , Eyes: PERRL EOM's full, conjunctiva clear, Nares: paten,t no deformity discharge or tenderness., Ears: no deformity EAC's clear TMs with normal landmarks. Mouth: clear OP, no lesions, edema. Low palate open airway  Moist mucous membranes. Dentition in adequate repair. NECK: supple without masses, thyromegaly or bruits. CHEST/PULM:  Clear to auscultation and percussion breath sounds equal no wheeze , rales or rhonchi. No chest wall deformities or tenderness.Breast: normal by inspection . No dimpling, discharge, masses, tenderness or discharge . CV: PMI is nondisplaced, S1 S2 no gallops, murmurs, rubs. Peripheral pulses are full without delay.No JVD .  ABDOMEN: Bowel sounds normal nontender  No guard or rebound, no hepato splenomegal no CVA tenderness.  No hernia. Extremtities:  No clubbing cyanosis or edema, no  acute joint swelling or redness no focal atrophy NEURO:  Oriented x3, cranial nerves 3-12 appear to be intact, no obvious focal weakness,gait within normal limits no abnormal  reflexes or asymmetrical SKIN: No acute rashes normal turgor, color, no bruising or petechiae. PSYCH: Oriented, good eye contact, no obvious depression anxiety, cognition and judgment appear normal. LN: no cervical axillaryadenopathy  Lab Results  Component Value Date   WBC 8.6 07/29/2014   HGB 13.6 07/29/2014   HCT 41.4 07/29/2014   PLT 322.0 07/29/2014   GLUCOSE 87 07/29/2014   CHOL 213* 07/29/2014   TRIG 94.0 07/29/2014   HDL 60.40 07/29/2014   LDLDIRECT 137.2 03/11/2012   LDLCALC 134* 07/29/2014   ALT 18 07/29/2014   AST 16 07/29/2014   NA 138 07/29/2014   K 5.0 07/29/2014   CL 104 07/29/2014   CREATININE 0.77 07/29/2014   BUN 11 07/29/2014   CO2 29 07/29/2014   TSH 0.85 07/29/2014   INR 0.91 12/05/2013   HGBA1C 5.6 03/02/2009   Wt Readings from Last 3 Encounters:  07/22/15 276 lb 9.6 oz (125.465 kg)  04/14/15 274 lb (124.286 kg)  04/01/15 274 lb 6.4 oz (124.467 kg)   BP Readings from Last 3 Encounters:  07/22/15 100/70  04/14/15 128/75  09/14/14 108/79    ASSESSMENT AND PLAN:  Discussed the following assessment and plan:  Visit for preventive health examination - Plan: SUMAtriptan (IMITREX) 100 MG tablet, Basic metabolic panel, CBC with Differential/Platelet, Hepatic function panel, Lipid panel, TSH, Follicle Stimulating Hormone, EKG 12-Lead  OBESITY - struggling agin see text  - Plan: SUMAtriptan (IMITREX) 100 MG tablet  Hyperlipidemia - Plan: SUMAtriptan (IMITREX) 100 MG tablet  Need for prophylactic vaccination and inoculation against influenza - Plan: SUMAtriptan (IMITREX) 100 MG tablet  Meningioma ([place]) - Plan: SUMAtriptan (IMITREX) 100 MG tablet  MENINGIOMA - Plan: SUMAtriptan (IMITREX) 100 MG tablet  Atypical chest pain - ekg nl bu pvcs  consdier cards if progressive   or  worsenign reeval  no m onexam todayhad ett 2012  no recent echo.  - Plan: EKG 12-Lead  Migraine without status migrainosus, not intractable, unspecified migraine type - 2 x per month refill med imitrex  Sleep disturbance  Oral contraceptive use - per gyne see text  Cp seems atypical for cv disease no m on exam  concern more about sleep of poss ue disturbance   She asks a bout   Medication for weight loss  Organize plan for weigh control  Diet sleep disc etc  And then rov in 6 weeks and poss  Add  phetermine  As she has been on this in past  / counseled strategy  About mainlining success ful  Healthy habits  Attend to sleep hygiene also   Disc with gyne getting off ocps and consider downshift to hrt if needed.  Patient  agrees  Looks like  risk  More than benefit at this time and age and with hx of migraine Patient Care Team: Burnis Medin, MD as PCP - General Jovita Gamma, MD (Neurosurgery) Inda Castle, MD (Gastroenterology) Newton Pigg, MD (Obstetrics and Gynecology) Lindwood Coke, MD as Attending Physician (Dermatology) Patient Instructions  Intensify lifestyle interventions.\ Attend to  Sleep hygiene . ROV in 6 weeks with  Weight control healthy eating plan . consideration of  Phentermine.    talks with  Gaylord Shih about  [place] Maintenance, Female Adopting a healthy lifestyle and getting preventive care can go a long way to promote health and wellness. Talk with your health care provider about what schedule of regular examinations is right for you. This is a good chance for you to check  in with your provider about disease prevention and staying healthy. In between checkups, there are plenty of things you can do on your own. Experts have done a lot of research about which lifestyle changes and preventive measures are most likely to keep you healthy. Ask your health care provider for more information. WEIGHT AND DIET  Eat a healthy diet  Be sure to include plenty of  vegetables, fruits, low-fat dairy products, and lean protein.  Do not eat a lot of foods high in solid fats, added sugars, or salt.  Get regular exercise. This is one of the most important things you can do for your health.  Most adults should exercise for at least 150 minutes each week. The exercise should increase your heart rate and make you sweat (moderate-intensity exercise).  Most adults should also do strengthening exercises at least twice a week. This is in addition to the moderate-intensity exercise.  Maintain a healthy weight  Body mass index (BMI) is a measurement that can be used to identify possible weight problems. It estimates body fat based on height and weight. Your health care provider can help determine your BMI and help you achieve or maintain a healthy weight.  For females 33 years of age and older:   A BMI below 18.5 is considered underweight.  A BMI of 18.5 to 24.9 is normal.  A BMI of 25 to 29.9 is considered overweight.  A BMI of 30 and above is considered obese.  Watch levels of cholesterol and blood lipids  You should start having your blood tested for lipids and cholesterol at 57 years of age, then have this test every 5 years.  You may need to have your cholesterol levels checked more often if:  Your lipid or cholesterol levels are high.  You are older than 57 years of age.  You are at high risk for heart disease.  CANCER SCREENING   Lung Cancer  Lung cancer screening is recommended for adults 35-57 years old who are at high risk for lung cancer because of a history of smoking.  A yearly low-dose CT scan of the lungs is recommended for people who:  Currently smoke.  Have quit within the past 15 years.  Have at least a 30-pack-year history of smoking. A pack year is smoking an average of one pack of cigarettes a day for 1 year.  Yearly screening should continue until it has been 15 years since you quit.  Yearly screening should stop if  you develop a health problem that would prevent you from having lung cancer treatment.  Breast Cancer  Practice breast self-awareness. This means understanding how your breasts normally appear and feel.  It also means doing regular breast self-exams. Let your health care provider know about any changes, no matter how small.  If you are in your 20s or 30s, you should have a clinical breast exam (CBE) by a health care provider every 1-3 years as part of a regular health exam.  If you are 50 or older, have a CBE every year. Also consider having a breast X-ray (mammogram) every year.  If you have a family history of breast cancer, talk to your health care provider about genetic screening.  If you are at high risk for breast cancer, talk to your health care provider about having an MRI and a mammogram every year.  Breast cancer gene (BRCA) assessment is recommended for women who have family members with BRCA-related cancers. BRCA-related cancers include:  Breast.  Ovarian.  Tubal.  Peritoneal cancers.  Results of the assessment will determine the need for genetic counseling and BRCA1 and BRCA2 testing. Cervical Cancer Your health care provider may recommend that you be screened regularly for cancer of the pelvic organs (ovaries, uterus, and vagina). This screening involves a pelvic examination, including checking for microscopic changes to the surface of your cervix (Pap test). You may be encouraged to have this screening done every 3 years, beginning at age 67.  For women ages 20-65, health care providers may recommend pelvic exams and Pap testing every 3 years, or they may recommend the Pap and pelvic exam, combined with testing for human papilloma virus (HPV), every 5 years. Some types of HPV increase your risk of cervical cancer. Testing for HPV may also be done on women of any age with unclear Pap test results.  Other health care providers may not recommend any screening for  nonpregnant women who are considered low risk for pelvic cancer and who do not have symptoms. Ask your health care provider if a screening pelvic exam is right for you.  If you have had past treatment for cervical cancer or a condition that could lead to cancer, you need Pap tests and screening for cancer for at least 20 years after your treatment. If Pap tests have been discontinued, your risk factors (such as having a new sexual partner) need to be reassessed to determine if screening should resume. Some women have medical problems that increase the chance of getting cervical cancer. In these cases, your health care provider may recommend more frequent screening and Pap tests. Colorectal Cancer  This type of cancer can be detected and often prevented.  Routine colorectal cancer screening usually begins at 57 years of age and continues through 57 years of age.  Your health care provider may recommend screening at an earlier age if you have risk factors for colon cancer.  Your health care provider may also recommend using home test kits to check for hidden blood in the stool.  A small camera at the end of a tube can be used to examine your colon directly (sigmoidoscopy or colonoscopy). This is done to check for the earliest forms of colorectal cancer.  Routine screening usually begins at age 37.  Direct examination of the colon should be repeated every 5-10 years through 57 years of age. However, you may need to be screened more often if early forms of precancerous polyps or small growths are found. Skin Cancer  Check your skin from head to toe regularly.  Tell your health care provider about any new moles or changes in moles, especially if there is a change in a mole's shape or color.  Also tell your health care provider if you have a mole that is larger than the size of a pencil eraser.  Always use sunscreen. Apply sunscreen liberally and repeatedly throughout the day.  Protect yourself  by wearing long sleeves, pants, a wide-brimmed hat, and sunglasses whenever you are outside. HEART DISEASE, DIABETES, AND HIGH BLOOD PRESSURE   High blood pressure causes heart disease and increases the risk of stroke. High blood pressure is more likely to develop in:  People who have blood pressure in the high end of the normal range (130-139/85-89 mm Hg).  People who are overweight or obese.  People who are African American.  If you are 52-26 years of age, have your blood pressure checked every 3-5 years. If you are 98 years of age or older,  have your blood pressure checked every year. You should have your blood pressure measured twice--once when you are at a hospital or clinic, and once when you are not at a hospital or clinic. Record the average of the two measurements. To check your blood pressure when you are not at a hospital or clinic, you can use:  An automated blood pressure machine at a pharmacy.  A home blood pressure monitor.  If you are between 69 years and 12 years old, ask your health care provider if you should take aspirin to prevent strokes.  Have regular diabetes screenings. This involves taking a blood sample to check your fasting blood sugar level.  If you are at a normal weight and have a low risk for diabetes, have this test once every three years after 57 years of age.  If you are overweight and have a high risk for diabetes, consider being tested at a younger age or more often. PREVENTING INFECTION  Hepatitis B  If you have a higher risk for hepatitis B, you should be screened for this virus. You are considered at high risk for hepatitis B if:  You were born in a country where hepatitis B is common. Ask your health care provider which countries are considered high risk.  Your parents were born in a high-risk country, and you have not been immunized against hepatitis B (hepatitis B vaccine).  You have HIV or AIDS.  You use needles to inject street  drugs.  You live with someone who has hepatitis B.  You have had sex with someone who has hepatitis B.  You get hemodialysis treatment.  You take certain medicines for conditions, including cancer, organ transplantation, and autoimmune conditions. Hepatitis C  Blood testing is recommended for:  Everyone born from [birth year] through 1967.  Anyone with known risk factors for hepatitis C. Sexually transmitted infections (STIs)  You should be screened for sexually transmitted infections (STIs) including gonorrhea and chlamydia if:  You are sexually active and are younger than 57 years of age.  You are older than 57 years of age and your health care provider tells you that you are at risk for this type of infection.  Your sexual activity has changed since you were last screened and you are at an increased risk for chlamydia or gonorrhea. Ask your health care provider if you are at risk.  If you do not have HIV, but are at risk, it may be recommended that you take a prescription medicine daily to prevent HIV infection. This is called pre-exposure prophylaxis (PrEP). You are considered at risk if:  You are sexually active and do not regularly use condoms or know the HIV status of your partner(s).  You take drugs by injection.  You are sexually active with a partner who has HIV. Talk with your health care provider about whether you are at high risk of being infected with HIV. If you choose to begin PrEP, you should first be tested for HIV. You should then be tested every 3 months for as long as you are taking PrEP.  PREGNANCY   If you are premenopausal and you may become pregnant, ask your health care provider about preconception counseling.  If you may become pregnant, take 400 to 800 micrograms (mcg) of folic acid every day.  If you want to prevent pregnancy, talk to your health care provider about birth control (contraception). OSTEOPOROSIS AND MENOPAUSE   Osteoporosis is a disease in  which the bones lose minerals  and strength with aging. This can result in serious bone fractures. Your risk for osteoporosis can be identified using a bone density scan.  If you are 87 years of age or older, or if you are at risk for osteoporosis and fractures, ask your health care provider if you should be screened.  Ask your health care provider whether you should take a calcium or vitamin D supplement to lower your risk for osteoporosis.  Menopause may have certain physical symptoms and risks.  Hormone replacement therapy may reduce some of these symptoms and risks. Talk to your health care provider about whether hormone replacement therapy is right for you.  HOME CARE INSTRUCTIONS   Schedule regular health, dental, and eye exams.  Stay current with your immunizations.   Do not use any tobacco products including cigarettes, chewing tobacco, or electronic cigarettes.  If you are pregnant, do not drink alcohol.  If you are breastfeeding, limit how much and how often you drink alcohol.  Limit alcohol intake to no more than 1 drink per day for nonpregnant women. One drink equals 12 ounces of beer, 5 ounces of wine, or 1 ounces of hard liquor.  Do not use street drugs.  Do not share needles.  Ask your health care provider for help if you need support or information about quitting drugs.  Tell your health care provider if you often feel depressed.  Tell your health care provider if you have ever been abused or do not feel safe at home.   This information is not intended to replace advice given to you by your health care provider. Make sure you discuss any questions you have with your health care provider.   Document Released: 11/21/2010 Document Revised: 05/29/2014 Document Reviewed: 04/09/2013 Elsevier Interactive Patient Education 2016 [place] K. Lucill Mauck M.D.

## 2015-08-10 ENCOUNTER — Encounter: Payer: Managed Care, Other (non HMO) | Admitting: Internal Medicine

## 2015-09-01 ENCOUNTER — Other Ambulatory Visit: Payer: Self-pay | Admitting: Obstetrics and Gynecology

## 2015-09-01 DIAGNOSIS — R928 Other abnormal and inconclusive findings on diagnostic imaging of breast: Secondary | ICD-10-CM

## 2015-09-07 ENCOUNTER — Ambulatory Visit
Admission: RE | Admit: 2015-09-07 | Discharge: 2015-09-07 | Disposition: A | Discharge status: Home / Self Care | Payer: BLUE CROSS/BLUE SHIELD | Source: Ambulatory Visit | Attending: Obstetrics and Gynecology | Admitting: Obstetrics and Gynecology

## 2015-09-07 DIAGNOSIS — R928 Other abnormal and inconclusive findings on diagnostic imaging of breast: Secondary | ICD-10-CM

## 2016-06-09 DIAGNOSIS — Z86011 Personal history of benign neoplasm of the brain: Secondary | ICD-10-CM | POA: Diagnosis not present

## 2016-06-21 DIAGNOSIS — Z5181 Encounter for therapeutic drug level monitoring: Secondary | ICD-10-CM | POA: Diagnosis not present

## 2016-06-21 DIAGNOSIS — Z79899 Other long term (current) drug therapy: Secondary | ICD-10-CM | POA: Diagnosis not present

## 2016-06-21 DIAGNOSIS — Z86011 Personal history of benign neoplasm of the brain: Secondary | ICD-10-CM | POA: Diagnosis not present

## 2016-06-23 ENCOUNTER — Ambulatory Visit (INDEPENDENT_AMBULATORY_CARE_PROVIDER_SITE_OTHER): Payer: Commercial Managed Care - PPO | Admitting: Family Medicine

## 2016-06-23 ENCOUNTER — Encounter: Payer: Self-pay | Admitting: Family Medicine

## 2016-06-23 VITALS — BP 138/88 | HR 76 | Temp 98.3°F | Ht 65.0 in | Wt 271.6 lb

## 2016-06-23 DIAGNOSIS — Z6841 Body Mass Index (BMI) 40.0 and over, adult: Secondary | ICD-10-CM

## 2016-06-23 DIAGNOSIS — J0100 Acute maxillary sinusitis, unspecified: Secondary | ICD-10-CM | POA: Diagnosis not present

## 2016-06-23 DIAGNOSIS — M67431 Ganglion, right wrist: Secondary | ICD-10-CM

## 2016-06-23 MED ORDER — AMOXICILLIN 875 MG PO TABS: 875.0000 mg | ORAL_TABLET | Freq: Two times a day (BID) | ORAL | 0 refills | Status: DC

## 2016-06-23 NOTE — Progress Notes (Signed)
Pre visit review using our clinic review tool, if applicable. No additional management support is needed unless otherwise documented below in the visit note. 

## 2016-06-24 NOTE — Progress Notes (Signed)
Tricia [name] is a 56 y.o. female is here to [place].   History of Present Illness:    1. Acute non-recurrent maxillary sinusitis: Bilateral sinus pain and pressure, with thick purulent nasal discharge, with mild dry cough. No fever, or sore throat. Has tried OTC treatment x 1-2 weeks without relief.    2. Ganglion cyst of volar aspect of right wrist: x several weeks, now starting to cause some pain and numbness in the area.     3. BMI 45.0-49.9, adult [facility]): With significant weight gain over the past few years. Interested in weight loss help.     PMHx, SurgHx, SocialHx, Medications, and Allergies were reviewed in the Visit Navigator and updated as appropriate.    Past Medical History:  Diagnosis Date  . Colon polyps 2006, 2011   TUBULAR ADENOMA AND HYPERPLASTIC POLYP  . Esophageal stricture 2012  . Glucosuria    with normal blood sugar  . Hepatitis C aby neg viral copies    serology positive but neg viral copies   . HLD (hyperlipidemia)   . Jaundice due to hepatitis    hx when younger unknown cause resolved   . Meningioma ([place])   . Migraine headache    some aura  period related  imitrex helps  . MVP (mitral valve prolapse)    echo 02 normal  . Seizures ([place])   . Ulcer ([place]) 2012    Past Surgical History:  Procedure Laterality Date  . APPENDECTOMY    . BUNIONECTOMY     laft  . CHOLECYSTECTOMY  2015   Dr Johney Maine  . CIN D&C conization  2000  . COLONOSCOPY W/ BIOPSIES  2011   SEVERAL   . ESOPHAGOGASTRODUODENOSCOPY  2012  . frontal meningioma resected  1998  . left shoulder dislocation surgery  2005  . TUBAL LIGATION      Family History  Problem Relation Age of Onset  . Colon cancer Father   . Colon cancer Paternal Aunt   . Colon cancer      both GMs   . Colon cancer Paternal Uncle   . Hyperlipidemia Father   . Other Brother     transient global amnesia  . Crohn's disease Daughter   . Coronary artery disease Father 45    MI  . Coronary artery  disease Maternal Uncle 74  . Diabetes Father     elderly  . Colon cancer Mother     Social History  Substance Use Topics  . Smoking status: Former Smoker    Packs/day: 1.50    Types: Cigarettes    Quit date: 05/22/1990  . Smokeless tobacco: Never Used  . Alcohol use Yes     Comment: occas     Current Medications and Allergies:    Current Outpatient Prescriptions:  .  Aspirin-Salicylamide-Caffeine (BC HEADACHE POWDER PO), Take 1 packet by mouth as needed., Disp: , Rfl:  .  cholecalciferol (VITAMIN D) 1000 UNITS tablet, Take 1,000 Units by mouth daily.  , Disp: , Rfl:  .  Cyanocobalamin (VITAMIN B 12 PO), Take 1 tablet by mouth daily.  , Disp: , Rfl:  .  OMEGA-3 1000 MG CAPS, Take 1 capsule by mouth daily.  , Disp: , Rfl:  .  PHENobarbital (LUMINAL) 97.2 MG tablet, Take 97.2 mg by mouth at bedtime. , Disp: , Rfl:  .  SUMAtriptan (IMITREX) 100 MG tablet, Take 1 tablet (100 mg total) by mouth once as needed for migraine (can repeat in 2  hours )., Disp: 9 tablet, Rfl: 3 .  Estradiol-Norethindrone Acet 0.5-0.1 MG per tablet, Take by mouth daily., Disp: , Rfl:  .  Multiple Vitamin (MULTIVITAMIN PO), Take 1 tablet by mouth daily.  , Disp: , Rfl:    Allergies  Allergen Reactions  . Codeine Sulfate     REACTION: unspecified  . Phenytoin Sodium Extended Rash      Patient Information Form: Screening and ROS    Do you feel safe in relationships? yes PHQ-2: negative  Review of Systems  General:  Negative for nexplained weight loss, fever Skin: Negative for new or changing mole, sore that won't heal HEENT: Negative for trouble hearing, trouble seeing, ringing in ears, mouth sores, hoarseness, change in voice, dysphagia CV:  Negative for chest pain, dyspnea, edema, palpitations Resp: Negative for dyspnea, hemoptysis GI: Negative for nausea, vomiting, diarrhea, constipation, abdominal pain, melena, hematochezia GU: Negative for dysuria, incontinence, urinary hesitance MSK:  Negative for muscle cramps or aches, joint pain or swelling Neuro: Negative for weakness, numbness, dizziness, passing out/fainting Psych: Negative for depression, anxiety, memory problems   Vitals:   Vitals:   06/23/16 0948  BP: 138/88  Pulse: 76  Temp: 98.3 F (36.8 C)  TempSrc: Oral  SpO2: 97%  Weight: 271 lb 9.6 oz (123.2 kg)  Height: 5\' 5"  (1.651 m)     Body mass index is 45.2 kg/m.   Physical Exam:    General: Alert, cooperative, appears stated age and no distress.  HEENT:  Normocephalic, without obvious abnormality, atraumatic. Conjunctivae/corneas clear. PERRL, EOM's intact. Normal TM's and external ear canals both ears. Nares normal. Septum midline. Mucosa normal. Maxillary sinus ttp bilaterally. Lips, mucosa, and tongue normal; teeth and gums normal.  Lungs: Clear to auscultation bilaterally.  Heart:: Regular rate and rhythm, S1, S2 normal, no murmur, click, rub or gallop.  Abdomen: Soft, non-tender; bowel sounds normal; no masses,  no organomegaly.  Extremities: Extremities normal, atraumatic, no cyanosis or edema.  Pulses: 2+ and symmetric.  Skin: Skin color, texture, turgor normal. Right ganglion cyst.   Neurologic: Alert and oriented X 3, normal strength and tone. Normal symmetric. reflexes. Normal coordination and gait.  Psych: Alert,oriented, in NAD with a full range of affect, normal behavior and no psychotic features       Assessment and Plan:    Ruchika was seen today for sore throat.  Diagnoses and all orders for this visit:  Acute non-recurrent maxillary sinusitis Comments: Orders below. Symptomatic care reviewd. Red flags discussed. Orders: -     amoxicillin (AMOXIL) 875 MG tablet; Take 1 tablet (875 mg total) by mouth 2 (two) times daily.  Ganglion cyst of volar aspect of right wrist Comments: Referral to [facility] for evaluation and treatment as cyst is causing some numbness and discomfort.  Orders: -     AMB referral to sports  medicine  BMI 45.0-49.9, adult [facility]) Comments: Morbid obesity discussion. Offered refferal to Medical Weight Loss, Dr. Leafy Ro. Patient will consider this and get and call back if interested.   . Reviewed expectations re: course of current medical issues. . Discussed self-management of symptoms. . Outlined signs and symptoms indicating need for more acute intervention. . Patient verbalized understanding and all questions were answered. . See orders for this visit as documented in the electronic medical record. . Patient received an After Visit Summary.   Briscoe Deutscher, [place], [place] 06/24/2016    Meds ordered this encounter  Medications  . amoxicillin (AMOXIL) 875 MG tablet  Sig: Take 1 tablet (875 mg total) by mouth 2 (two) times daily.    Dispense:  20 tablet    Refill:  0   There are no discontinued medications. Orders Placed This Encounter  Procedures  . AMB referral to sports medicine

## 2016-06-26 ENCOUNTER — Telehealth: Payer: Self-pay | Admitting: Family Medicine

## 2016-06-26 NOTE — Telephone Encounter (Signed)
LM for patient to return call.

## 2016-06-26 NOTE — Telephone Encounter (Signed)
Patient is concerned the antibiotics Dr. Briscoe Deutscher prescribed to her. Says she's not feeling better.

## 2016-06-28 NOTE — Telephone Encounter (Signed)
LM for patient to return call.

## 2016-06-29 MED ORDER — CEFDINIR 300 MG PO CAPS: 300.0000 mg | ORAL_CAPSULE | Freq: Two times a day (BID) | ORAL | 0 refills | Status: DC

## 2016-06-29 NOTE — Telephone Encounter (Signed)
Please call patient about new medication amoxicillin (AMOXIL) 875 MG tablet. She does not feel that it is working. [phone].   Thank you.

## 2016-06-29 NOTE — Telephone Encounter (Signed)
Changed to [facility]. Let me know if she has any concern about this.

## 2016-06-30 NOTE — Telephone Encounter (Signed)
Spoke with patient and advised. She has picked up rx from her pharmacy.

## 2016-07-04 ENCOUNTER — Other Ambulatory Visit: Payer: Self-pay | Admitting: Obstetrics and Gynecology

## 2016-07-04 DIAGNOSIS — Z1231 Encounter for screening mammogram for malignant neoplasm of breast: Secondary | ICD-10-CM

## 2016-08-02 ENCOUNTER — Other Ambulatory Visit: Payer: BLUE CROSS/BLUE SHIELD

## 2016-08-02 ENCOUNTER — Ambulatory Visit: Payer: Self-pay

## 2016-08-02 ENCOUNTER — Encounter: Payer: Self-pay | Admitting: Sports Medicine

## 2016-08-02 ENCOUNTER — Ambulatory Visit (INDEPENDENT_AMBULATORY_CARE_PROVIDER_SITE_OTHER): Payer: Commercial Managed Care - PPO

## 2016-08-02 ENCOUNTER — Ambulatory Visit (INDEPENDENT_AMBULATORY_CARE_PROVIDER_SITE_OTHER): Payer: Commercial Managed Care - PPO | Admitting: Sports Medicine

## 2016-08-02 ENCOUNTER — Ambulatory Visit (INDEPENDENT_AMBULATORY_CARE_PROVIDER_SITE_OTHER): Payer: Commercial Managed Care - PPO | Admitting: Family Medicine

## 2016-08-02 VITALS — BP 116/90 | HR 72 | Ht 65.0 in | Wt 270.8 lb

## 2016-08-02 VITALS — BP 124/82 | HR 77 | Temp 97.9°F | Ht 65.0 in | Wt 271.6 lb

## 2016-08-02 DIAGNOSIS — G43009 Migraine without aura, not intractable, without status migrainosus: Secondary | ICD-10-CM | POA: Diagnosis not present

## 2016-08-02 DIAGNOSIS — S6991XA Unspecified injury of right wrist, hand and finger(s), initial encounter: Secondary | ICD-10-CM | POA: Diagnosis not present

## 2016-08-02 DIAGNOSIS — I341 Nonrheumatic mitral (valve) prolapse: Secondary | ICD-10-CM

## 2016-08-02 DIAGNOSIS — M25531 Pain in right wrist: Secondary | ICD-10-CM

## 2016-08-02 DIAGNOSIS — E559 Vitamin D deficiency, unspecified: Secondary | ICD-10-CM

## 2016-08-02 DIAGNOSIS — Z9049 Acquired absence of other specified parts of digestive tract: Secondary | ICD-10-CM

## 2016-08-02 DIAGNOSIS — Z9851 Tubal ligation status: Secondary | ICD-10-CM

## 2016-08-02 DIAGNOSIS — K0510 Chronic gingivitis, plaque induced: Secondary | ICD-10-CM

## 2016-08-02 DIAGNOSIS — R600 Localized edema: Secondary | ICD-10-CM

## 2016-08-02 DIAGNOSIS — R0789 Other chest pain: Secondary | ICD-10-CM

## 2016-08-02 DIAGNOSIS — R7303 Prediabetes: Secondary | ICD-10-CM | POA: Diagnosis not present

## 2016-08-02 DIAGNOSIS — J329 Chronic sinusitis, unspecified: Secondary | ICD-10-CM | POA: Diagnosis not present

## 2016-08-02 DIAGNOSIS — Z0001 Encounter for general adult medical examination with abnormal findings: Secondary | ICD-10-CM

## 2016-08-02 DIAGNOSIS — E785 Hyperlipidemia, unspecified: Secondary | ICD-10-CM | POA: Diagnosis not present

## 2016-08-02 DIAGNOSIS — M67431 Ganglion, right wrist: Secondary | ICD-10-CM | POA: Insufficient documentation

## 2016-08-02 DIAGNOSIS — R81 Glycosuria: Secondary | ICD-10-CM | POA: Diagnosis not present

## 2016-08-02 DIAGNOSIS — B9689 Other specified bacterial agents as the cause of diseases classified elsewhere: Secondary | ICD-10-CM

## 2016-08-02 DIAGNOSIS — D329 Benign neoplasm of meninges, unspecified: Secondary | ICD-10-CM

## 2016-08-02 LAB — CBC WITH DIFFERENTIAL/PLATELET
Basophils Absolute: 0.1 10*3/uL (ref 0.0–0.1)
Basophils Relative: 1 % (ref 0.0–3.0)
Eosinophils Absolute: 0.2 10*3/uL (ref 0.0–0.7)
Eosinophils Relative: 3 % (ref 0.0–5.0)
HCT: 43 % (ref 36.0–46.0)
Hemoglobin: 14.2 g/dL (ref 12.0–15.0)
Lymphocytes Relative: 22.6 % (ref 12.0–46.0)
Lymphs Abs: 1.6 10*3/uL (ref 0.7–4.0)
MCHC: 33 g/dL (ref 30.0–36.0)
MCV: 86.3 fl (ref 78.0–100.0)
Monocytes Absolute: 0.5 10*3/uL (ref 0.1–1.0)
Monocytes Relative: 7.4 % (ref 3.0–12.0)
Neutro Abs: 4.6 10*3/uL (ref 1.4–7.7)
Neutrophils Relative %: 66 % (ref 43.0–77.0)
Platelets: 275 10*3/uL (ref 150.0–400.0)
RBC: 4.98 Mil/uL (ref 3.87–5.11)
RDW: 13.1 % (ref 11.5–15.5)
WBC: 7 10*3/uL (ref 4.0–10.5)

## 2016-08-02 LAB — LIPID PANEL
Cholesterol: 261 mg/dL — ABNORMAL HIGH (ref 0–200)
HDL: 55.3 mg/dL (ref >39.00)
LDL Cholesterol: 170 mg/dL — ABNORMAL HIGH (ref 0–99)
NonHDL: 205.71
Total CHOL/HDL Ratio: 5
Triglycerides: 177 mg/dL — ABNORMAL HIGH (ref 0.0–149.0)
VLDL: 35.4 mg/dL (ref 0.0–40.0)

## 2016-08-02 LAB — COMPREHENSIVE METABOLIC PANEL
ALT: 12 U/L (ref 0–35)
AST: 12 U/L (ref 0–37)
Albumin: 4.3 g/dL (ref 3.5–5.2)
Alkaline Phosphatase: 97 U/L (ref 39–117)
BUN: 19 mg/dL (ref 6–23)
CO2: 28 mEq/L (ref 19–32)
Calcium: 9.4 mg/dL (ref 8.4–10.5)
Chloride: 103 mEq/L (ref 96–112)
Creatinine, Ser: 0.85 mg/dL (ref 0.40–1.20)
GFR: 72.99 mL/min (ref >60.00)
Glucose, Bld: 88 mg/dL (ref 70–99)
Potassium: 4.2 mEq/L (ref 3.5–5.1)
Sodium: 140 mEq/L (ref 135–145)
Total Bilirubin: 0.3 mg/dL (ref 0.2–1.2)
Total Protein: 7.5 g/dL (ref 6.0–8.3)

## 2016-08-02 LAB — HEMOGLOBIN A1C: Hgb A1c MFr Bld: 6 % (ref 4.6–6.5)

## 2016-08-02 LAB — VITAMIN D 25 HYDROXY (VIT D DEFICIENCY, FRACTURES): VITD: 19.98 ng/mL — ABNORMAL LOW (ref 30.00–100.00)

## 2016-08-02 LAB — TSH: TSH: 2.72 u[IU]/mL (ref 0.35–4.50)

## 2016-08-02 MED ORDER — SUMATRIPTAN SUCCINATE 100 MG PO TABS: 100.0000 mg | ORAL_TABLET | Freq: Once | ORAL | 3 refills | Status: DC

## 2016-08-02 MED ORDER — AZITHROMYCIN 250 MG PO TABS: ORAL_TABLET | ORAL | 0 refills | Status: DC

## 2016-08-02 NOTE — Patient Instructions (Signed)
I recommend you obtained a compression sleeve to help with your joint problems. There are many options on the market however I recommend obtaining a FULL WRIST Body Helix compression sleeve.  You can find information (including how to appropriate measure yourself for sizing) can be found at www.Body http://www.lambert.com/.  Many of these products are health savings account (HSA) eligible.   You can use the compression sleeve at any time throughout the day but is most important to use while being active as well as for 2 hours post-activity.   It is appropriate to ice following activity with the compression sleeve in place.

## 2016-08-02 NOTE — Progress Notes (Signed)
Tricia [name] is a 56 y.o. female is here for a comprehensive physical exam, but has several other acute issues to discuss today.   History of Present Illness:   HPI:  1. Atypical chest pain. Mid chest to left side. Happens during the day while sitting or walking, and at night while in bed. Some fluttering feelings with this. No radiation of pain. No SOB, N/V, abdominal pain. Hx of GERD. Hx of MVP. No treatment.   2. Bacterial sinusitis. Right maxillary sinus pressure. Hx of the same a few weeks ago, did not finish Abx since she deleveloped diarrhea. No f/c, HA, dizziness, lower respiratory symptoms.    3. Gingivitis. Followed by dentist.     4. Bilateral leg edema. Worse at the end of the day. Limited to mid shin to feet, mild pitting per description. Better in the morning.    There are no preventive care reminders to display for this patient.  PMHx, SurgHx, SocialHx, FamHx, Medications, and Allergies were reviewed in the Visit Navigator and updated as appropriate.   Patient Active Problem List   Diagnosis Date Noted  . Gingivitis 08/04/2016  . Bilateral leg edema 08/04/2016  . Right wrist pain 08/02/2016  . Ganglion cyst of volar aspect of right wrist 08/02/2016  . History of hepatitis C 12/06/2013  . Seizure disorder ([place]) 12/06/2013  . Esophageal reflux 03/19/2012  . 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 Family history of malignant neoplasm of gastrointestinal tract 04/11/2011  . Hyperlipidemia 02/10/2011  . MVP (mitral valve prolapse)   . Meningioma ([place])   . Migraine headache   . Vitamin D deficiency 12/24/2007  . BMI 45.0-49.9, adult ([place]) 12/24/2007  . History of colonic polyps 02/27/2007   Social History  Substance Use Topics  . Smoking status: Former Smoker    Packs/day: 1.50    Types: Cigarettes    Quit date: 05/22/1990  . Smokeless tobacco: Never Used  . Alcohol use Yes     Comment: occas   Current Medications and Allergies:   .   Aspirin-Salicylamide-Caffeine (BC HEADACHE POWDER PO), Take 1 packet by mouth as needed., Disp: , Rfl:  .  cholecalciferol (VITAMIN D) 1000 UNITS tablet, Take 1,000 Units by mouth daily.  , Disp: , Rfl:  .  Cyanocobalamin (VITAMIN B 12 PO), Take 1 tablet by mouth daily.  , Disp: , Rfl:  .  OMEGA-3 1000 MG CAPS, Take 1 capsule by mouth daily.  , Disp: , Rfl:  .  PHENobarbital (LUMINAL) 97.2 MG tablet, Take 97.2 mg by mouth at bedtime. , Disp: , Rfl:  .  SUMAtriptan (IMITREX) 100 MG tablet, Take 1 tablet (100 mg total) by mouth once. May repeat in 2 hours if headache persists or recurs.,    Allergies  Allergen Reactions  . Codeine Sulfate   . Phenytoin Sodium Extended Rash   Review of Systems   Review of Systems  Constitutional: Negative for chills, fever and weight loss.  HENT: Positive for congestion and sinus pain. Negative for ear pain, hearing loss, sore throat and tinnitus.   Eyes: Negative for blurred vision.  Respiratory: Negative for cough, shortness of breath and wheezing.   Cardiovascular: Positive for chest pain, palpitations and leg swelling. Negative for orthopnea, claudication and PND.  Gastrointestinal: Negative for abdominal pain, constipation, diarrhea, heartburn, nausea and vomiting.  Genitourinary: Negative for dysuria.  Skin: Negative for rash.  Neurological: Negative for dizziness and weakness.  Psychiatric/Behavioral: Negative for depression, hallucinations, substance abuse and suicidal ideas.  Vitals:   Vitals:   08/02/16 0757  BP: 124/82  Pulse: 77  Temp: 97.9 F (36.6 C)  TempSrc: Oral  SpO2: 96%  Weight: 271 lb 9.6 oz (123.2 kg)  Height: 5\' 5"  (1.651 m)     Body mass index is 45.2 kg/m.   Physical Exam:    Physical Exam  Constitutional: She is oriented to person, place, and time. She appears well-developed and well-nourished. No distress.  HENT:  Head: Normocephalic and atraumatic.  Right Ear: Tympanic membrane normal.  Left Ear: Tympanic  membrane normal.  Nose: Right sinus exhibits maxillary sinus tenderness.  Eyes: Conjunctivae and EOM are normal. Pupils are equal, round, and reactive to light.  Neck: Normal range of motion. Neck supple. No JVD present. No thyromegaly present.  Cardiovascular: Normal rate, regular rhythm, normal heart sounds and intact distal pulses.   Pulmonary/Chest: Effort normal and breath sounds normal.  Abdominal: Soft. Bowel sounds are normal.  Lymphadenopathy:    She has no cervical adenopathy.  Neurological: She is oriented to person, place, and time.  Skin: Skin is warm and dry. Capillary refill takes less than 2 seconds. No rash noted.  Psychiatric: She has a normal mood and affect. Her behavior is normal. Judgment and thought content normal.   Results for orders placed or performed in visit on 08/02/16  CBC with Differential  Result Value Ref Range   WBC 7.0 4.0 - 10.5 K/uL   RBC 4.98 3.87 - 5.11 Mil/uL   Hemoglobin 14.2 12.0 - 15.0 g/dL   HCT 43.0 36.0 - 46.0 %   MCV 86.3 78.0 - 100.0 fl   MCHC 33.0 30.0 - 36.0 g/dL   RDW 13.1 11.5 - 15.5 %   Platelets 275.0 150.0 - 400.0 K/uL   Neutrophils Relative % 66.0 43.0 - 77.0 %   Lymphocytes Relative 22.6 12.0 - 46.0 %   Monocytes Relative 7.4 3.0 - 12.0 %   Eosinophils Relative 3.0 0.0 - 5.0 %   Basophils Relative 1.0 0.0 - 3.0 %   Neutro Abs 4.6 1.4 - 7.7 K/uL   Lymphs Abs 1.6 0.7 - 4.0 K/uL   Monocytes Absolute 0.5 0.1 - 1.0 K/uL   Eosinophils Absolute 0.2 0.0 - 0.7 K/uL   Basophils Absolute 0.1 0.0 - 0.1 K/uL  Comprehensive metabolic panel  Result Value Ref Range   Sodium 140 135 - 145 mEq/L   Potassium 4.2 3.5 - 5.1 mEq/L   Chloride 103 96 - 112 mEq/L   CO2 28 19 - 32 mEq/L   Glucose, Bld 88 70 - 99 mg/dL   BUN 19 6 - 23 mg/dL   Creatinine, Ser 0.85 0.40 - 1.20 mg/dL   Total Bilirubin 0.3 0.2 - 1.2 mg/dL   Alkaline Phosphatase 97 39 - 117 U/L   AST 12 0 - 37 U/L   ALT 12 0 - 35 U/L   Total Protein 7.5 6.0 - 8.3 g/dL    Albumin 4.3 3.5 - 5.2 g/dL   Calcium 9.4 8.4 - 10.5 mg/dL   GFR 72.99 >60.00 mL/min  Lipid panel  Result Value Ref Range   Cholesterol 261 (H) 0 - 200 mg/dL   Triglycerides 177.0 (H) 0.0 - 149.0 mg/dL   HDL 55.30 >39.00 mg/dL   VLDL 35.4 0.0 - 40.0 mg/dL   LDL Cholesterol 170 (H) 0 - 99 mg/dL   Total CHOL/HDL Ratio 5    NonHDL 205.71   VITAMIN D 25 Hydroxy (Vit-D Deficiency, Fractures)  Result Value Ref  Range   VITD 19.98 (L) 30.00 - 100.00 ng/mL  TSH  Result Value Ref Range   TSH 2.72 0.35 - 4.50 uIU/mL  Hemoglobin A1c  Result Value Ref Range   Hgb A1c MFr Bld 6.0 4.6 - 6.5 %   EKG: normal EKG, normal sinus rhythm.   Assessment and Plan:    Atypical chest pain Comments: EKG NSR without ST, T changes. Hx of MVP. Nightly episodes of chest pain. Some exertional SOB. To Cardiology for reevaluation.  Orders: -     EKG 12-Lead -     Ambulatory referral to Cardiology  MVP (mitral valve prolapse)  Bacterial sinusitis Comments: Recurrent infection. Rx Zpak as safety net. Patient will continue OTC treatment for now.  Orders: -     azithromycin (ZITHROMAX Z-PAK) 250 MG tablet; 2 po x 1 on first day, then one po daily each day x 4 day  Gingivitis Comments: Followed by Dentist.   Bilateral leg edema Comments: Minor. Recommend compression hose to be worn during the day. Watch weight.   . Reviewed expectations re: course of current medical issues. . Discussed self-management of symptoms. . Outlined signs and symptoms indicating need for more acute intervention. . Patient verbalized understanding and all questions were answered. . See orders for this visit as documented in the electronic medical record. . Patient received an After Visit Summary.   Briscoe Deutscher, [place], [place] 08/02/2016  Follow-up: Return in 6 months (on 02/02/2017).

## 2016-08-02 NOTE — Progress Notes (Signed)
Tricia [name] - 56 y.o. female MRN [id number]  Date of birth: [birth year]  Office Visit Note: Visit Date: 08/02/2016 PCP: Briscoe Deutscher, DO Referred by: Briscoe Deutscher, DO  Subjective: Chief Complaint  Patient presents with  . Ganglion Cyst RT Wrist    Intermittent pain and swelling for 6 months. Uses a wrist brace and icing area with some relief. Pt does use a mouse daily.    HPI: Patient presents with 6 months of right wrist swelling and pain along the palmar aspect.  She has noticed moderate amount of swelling along the radial side of the proximal wrist crease.  She has tried using a brace but this has not been beneficial.  She does report a fall approximately 6 months ago landing on an outstretched hand.  Pain has been progressively worsening since that time.  No prior wrist injuries prior to this fall.  She is reporting some clicking and popping right worse than left wrists but present bilaterally. ROS: She denies any fevers chills recent weight gain or weight loss.  Otherwise per HPI.  Objective:  VS:  HT:5\' 5"  (165.1 cm)   WT:270 lb 12.8 oz (122.8 kg)  BMI:45.2    BP:116/90  HR:72bpm  TEMP: ( )  RESP:97 % Physical Exam: GENERAL:  WDWN, NAD, Non-toxic appearing PSYCH:  Alert & appropriately interactive  Not depressed or anxious appearing UPPER EXTREMITIES:  No significant rashes/lesions/ulcerations overlying the arms.  No significant generalized UE edema.  No clubbing or cyanosis.  Radial pulses 2+/4.  Sensation intact to light touch. RIGHT WRIST: Very large radial sided cystic structure that is ballotable.  She has pain with direct palpation.  No pain with carpal tunnel compression test.  She has a small amount of swelling within the wrist joint itself.  Minimal pain with palpation of the scapholunate joint.  No focal pain over the anatomic snuffbox.  Negative Finkelstein.   Imaging & Procedures: Dg Wrist Complete Right  Result Date: 08/02/2016 CLINICAL DATA:   Status post fall, wrist pain EXAM: RIGHT WRIST - COMPLETE 3+ VIEW COMPARISON:  None. FINDINGS: Generalized osteopenia. No acute fracture or dislocation. Mild osteoarthritis of the first carpometacarpal joint. Mild osteoarthritis of the scaphotrapeziotrapezoid joint. No soft tissue abnormality. IMPRESSION: No acute osseous injury of the right wrist. Electronically Signed   By: Kathreen Devoid   On: 08/02/2016 09:40   PROCEDURE NOTE: ULTRASOUND GUIDED RIGHT WRIST GANGLION CYST ASPIRATION AND INJECTION Images were obtained and interpreted by myself, Teresa Coombs, DO  Images have been saved and stored to PACS system. Images obtained on: GE S7 Ultrasound machine DESCRIPTION OF PROCEDURE:  The patient's clinical condition is marked by substantial pain and/or significant functional disability. Other conservative therapy has not provided relief, is contraindicated, or not appropriate. There is a reasonable likelihood that injection will significantly improve the patient's pain and/or functional impairment. After discussing the risks, benefits and expected outcomes of the injection and all questions were reviewed and answered, the patient wished to undergo the above named procedure. Verbal consent was obtained. The ultrasound was used to identify the target structure and adjacent neurovascular structures. The skin was then prepped in sterile fashion and the target structure was injected under direct visualization using sterile technique as below: PREP: Alcohol, Ethel Chloride, 5cc 1% lidocaine on 25 needle APPROACH: radial ,stopcock technique, 18 g 1 .5" needle INJECTATE: 1cc 0.5% marcaine, 1 cc 40mg  DepoMedrol ASPIRATE: 3cc thick gelatinous fluid DRESSING: Band-Aid and 4" Ace-Wrap  Post procedural instructions including recommending icing  and warning signs for infection were reviewed. This procedure was well tolerated and there were no complications.   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  She did have a fall several months ago but no evidence of scapholunate widening although she does have some widening of the scaphoid trapezial joint.  The cystic structure was quite complex and suspect a synovial cyst which is likely causing some widening of that joint.      Ganglion cyst of volar aspect of right wrist - Primary    Body Healix compression sleeve for the wrist discussed.  Aspiration and injection performed today.  Discussed the high likelihood of return of symptoms.  If any lack of improvement consider further diagnostic imaging with MRI for consideration of surgical excision.      Relevant Orders   DG Wrist Complete Right (Completed)   [place] LIMITED JOINT SPACE STRUCTURES UP RIGHT      Follow-up: No Follow-up on file.   Past Medical/Family/Surgical/Social History: Medications & Allergies reviewed per EMR Patient Active Problem List   Diagnosis Date Noted  . Right wrist pain 08/02/2016  . Ganglion cyst of volar aspect of right wrist 08/02/2016  . History of hepatitis C 12/06/2013  . Seizure disorder ([place]) 12/06/2013  . Transaminitis 12/05/2013  . Esophageal reflux 03/19/2012  . 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 Family history of malignant neoplasm of gastrointestinal tract 04/11/2011  . Hyperlipidemia 02/10/2011  . MVP (mitral valve prolapse)   . Glucosuria   . Meningioma ([place])   . Migraine headache   . UNSPECIFIED VITAMIN D DEFICIENCY 12/24/2007  . BMI 45.0-49.9, adult ([place]) 12/24/2007  . MITRAL VALVE PROLAPSE 02/27/2007  . History of colonic polyps 02/27/2007   Past Medical History:  Diagnosis Date  . Colon polyps 2006, 2011   TUBULAR ADENOMA AND HYPERPLASTIC POLYP  . Esophageal stricture 2012  . Glucosuria    with normal blood sugar  . Hepatitis C aby neg viral copies    serology positive but neg viral  copies   . HLD (hyperlipidemia)   . Jaundice due to hepatitis    hx when younger unknown cause resolved   . Meningioma ([place])   . Migraine headache    some aura  period related  imitrex helps  . MVP (mitral valve prolapse)    echo 02 normal  . Seizures ([place])   . Ulcer ([place]) 2012   Family History  Problem Relation Age of Onset  . Colon cancer Father   . Hyperlipidemia Father   . Coronary artery disease Father 8    MI  . Diabetes Father     elderly  . Colon cancer Paternal Aunt   . Colon cancer      both GMs   . Colon cancer Paternal Uncle   . Other Brother     transient global amnesia  . Crohn's disease Daughter   . Coronary artery disease Maternal Uncle 74  . Colon cancer Mother    Past Surgical History:  Procedure Laterality Date  . APPENDECTOMY    . BUNIONECTOMY     laft  . CHOLECYSTECTOMY  2015   Dr Johney Maine  . CIN D&C conization  2000  . COLONOSCOPY W/ BIOPSIES  2011   SEVERAL   . ESOPHAGOGASTRODUODENOSCOPY  2012  . frontal meningioma resected  1998  .  left shoulder dislocation surgery  2005  . 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 Smoking status: Former Smoker    Packs/day: 1.50    Types: Cigarettes    Quit date: 05/22/1990  . Smokeless tobacco: Never Used  . Alcohol use Yes     Comment: occas  . Drug use: No  . Sexual activity: Not on file

## 2016-08-02 NOTE — Progress Notes (Signed)
Pre visit review using our clinic review tool, if applicable. No additional management support is needed unless otherwise documented below in the visit note. 

## 2016-08-02 NOTE — Progress Notes (Addendum)
Subjective:    Tricia [name] is a 56 y.o. female and is here for a comprehensive physical exam.  HPI  See separate note regarding significant problems addressed in addition to the [facility] exam elements. See next page.  There are no preventive care reminders to display for this patient.  PMHx, SurgHx, SocialHx, Medications, and Allergies were reviewed in the Visit Navigator and updated as appropriate.    Patient Active Problem List   Diagnosis Date Noted  . Gingivitis 08/04/2016  . Bilateral leg edema 08/04/2016  . Right wrist pain 08/02/2016  . Ganglion cyst of volar aspect of right wrist 08/02/2016  . History of hepatitis C 12/06/2013  . Seizure disorder ([place]) 12/06/2013  . Esophageal reflux 03/19/2012  . 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 Family history of malignant neoplasm of gastrointestinal tract 04/11/2011  . Hyperlipidemia 02/10/2011  . MVP (mitral valve prolapse)   . Meningioma ([place])   . Migraine headache   . Vitamin D deficiency 12/24/2007  . BMI 45.0-49.9, adult ([place]) 12/24/2007  . History of colonic polyps 02/27/2007    Past Surgical History:  Procedure Laterality Date  . APPENDECTOMY    . BUNIONECTOMY LEFT   . CHOLECYSTECTOMY  2015  . CIN D&C CONIZATION  2000  . COLONOSCOPY W/ BIOPSIES  2011  . ESOPHAGOGASTRODUODENOSCOPY  2012  . FRONTAL MENINGIOMA RESECTED  1998  . SHOULDER DISLOCATION LEFT 2005  . TUBAL LIGATION     Family History  Problem Relation Age of Onset  . Colon cancer Father   . Colon cancer Paternal Aunt   . Colon cancer      both GMs   . Colon cancer Paternal Uncle   . Hyperlipidemia Father   . Other Brother     transient global amnesia  . Crohn's disease Daughter   . Coronary artery disease Father 5    MI  . Coronary artery disease Maternal Uncle 74  . Diabetes Father     elderly  . Colon cancer Mother     Social History  Substance Use Topics  . Smoking status: Former Smoker    Packs/day: 1.50    Types: Cigarettes     Quit date: 05/22/1990  . Smokeless tobacco: Never Used  . Alcohol use Yes     Comment: occas    Review of Systems:   Review of Systems  Constitutional: Negative for chills and fever.  HENT: Negative for ear pain, hearing loss and sore throat.   Eyes: Negative for blurred vision and double vision.  Respiratory: Negative for cough, shortness of breath and wheezing.   Cardiovascular: Positive for leg swelling. Negative for chest pain and palpitations.       When patient gets home in the afternoon after work.  Gastrointestinal: Negative for abdominal pain, heartburn, nausea and vomiting.  Genitourinary: Negative for dysuria.  Musculoskeletal: Negative for back pain, falls and neck pain.  Skin: Negative for itching and rash.  Neurological: Negative for dizziness, weakness and headaches.  Psychiatric/Behavioral: Negative for depression, substance abuse and suicidal ideas.    Objective:   BP 124/82   Pulse 77   Temp 97.9 F (36.6 C) (Oral)   Ht 5\' 5"  (1.651 m)   Wt 271 lb 9.6 oz (123.2 kg)   LMP 12/23/2010   SpO2 96%   BMI 45.20 kg/m  Body mass index is 45.2 kg/m.   General Appearance:    Alert, cooperative, no distress, appears stated age  Head:    Normocephalic, without obvious abnormality,  atraumatic  Eyes:    PERRL, conjunctiva/corneas clear, EOM's intact, fundi    benign, both eyes  Ears:    Normal TM's and external ear canals, both ears  Nose:   Nares normal, septum midline, maxillary ttp  Throat:   Lips, mucosa, and tongue normal; teeth and gums normal  Neck:   Supple, symmetrical, trachea midline, no adenopathy;    thyroid:  no enlargement/tenderness/nodules; no carotid   bruit or JVD  Back:     Symmetric, no curvature, ROM normal, no CVA tenderness  Lungs:     Clear to auscultation bilaterally, respirations unlabored  Chest Wall:    No tenderness or deformity   Heart:    Regular rate and rhythm, S1 and S2 normal, no murmur, rub   or gallop  Abdomen:     Soft,  non-tender, bowel sounds active all four quadrants,    no masses, no organomegaly  Pulses:   2+ and symmetric all extremities  Skin:   Skin color, texture, turgor normal, no rashes or lesions  Lymph nodes:   Cervical, supraclavicular, and axillary nodes normal  Neurologic:   CNII-XII intact, normal strength, sensation and reflexes    throughout   Results for orders placed or performed in visit on 08/02/16  CBC with Differential  Result Value Ref Range   WBC 7.0 4.0 - 10.5 K/uL   RBC 4.98 3.87 - 5.11 Mil/uL   Hemoglobin 14.2 12.0 - 15.0 g/dL   HCT 43.0 36.0 - 46.0 %   MCV 86.3 78.0 - 100.0 fl   MCHC 33.0 30.0 - 36.0 g/dL   RDW 13.1 11.5 - 15.5 %   Platelets 275.0 150.0 - 400.0 K/uL   Neutrophils Relative % 66.0 43.0 - 77.0 %   Lymphocytes Relative 22.6 12.0 - 46.0 %   Monocytes Relative 7.4 3.0 - 12.0 %   Eosinophils Relative 3.0 0.0 - 5.0 %   Basophils Relative 1.0 0.0 - 3.0 %   Neutro Abs 4.6 1.4 - 7.7 K/uL   Lymphs Abs 1.6 0.7 - 4.0 K/uL   Monocytes Absolute 0.5 0.1 - 1.0 K/uL   Eosinophils Absolute 0.2 0.0 - 0.7 K/uL   Basophils Absolute 0.1 0.0 - 0.1 K/uL  Comprehensive metabolic panel  Result Value Ref Range   Sodium 140 135 - 145 mEq/L   Potassium 4.2 3.5 - 5.1 mEq/L   Chloride 103 96 - 112 mEq/L   CO2 28 19 - 32 mEq/L   Glucose, Bld 88 70 - 99 mg/dL   BUN 19 6 - 23 mg/dL   Creatinine, Ser 0.85 0.40 - 1.20 mg/dL   Total Bilirubin 0.3 0.2 - 1.2 mg/dL   Alkaline Phosphatase 97 39 - 117 U/L   AST 12 0 - 37 U/L   ALT 12 0 - 35 U/L   Total Protein 7.5 6.0 - 8.3 g/dL   Albumin 4.3 3.5 - 5.2 g/dL   Calcium 9.4 8.4 - 10.5 mg/dL   GFR 72.99 >60.00 mL/min  Lipid panel  Result Value Ref Range   Cholesterol 261 (H) 0 - 200 mg/dL   Triglycerides 177.0 (H) 0.0 - 149.0 mg/dL   HDL 55.30 >39.00 mg/dL   VLDL 35.4 0.0 - 40.0 mg/dL   LDL Cholesterol 170 (H) 0 - 99 mg/dL   Total CHOL/HDL Ratio 5    NonHDL 205.71   VITAMIN D 25 Hydroxy (Vit-D Deficiency, Fractures)  Result  Value Ref Range   VITD 19.98 (L) 30.00 - 100.00 ng/mL  TSH  Result Value Ref Range   TSH 2.72 0.35 - 4.50 uIU/mL  Hemoglobin A1c  Result Value Ref Range   Hgb A1c MFr Bld 6.0 4.6 - 6.5 %     Assessment/Plan:   Tricia [name] was seen today for annual exam.  Diagnoses and all orders for this visit:  Encounter for general adult medical examination with abnormal findings -     CBC with Differential  Migraine without aura and without status migrainosus, not intractable Comments: Refill of Imitrex today. Orders: -     SUMAtriptan (IMITREX) 100 MG tablet; Take 1 tablet (100 mg total) by mouth once. May repeat in 2 hours if headache persists or recurs.  Hyperlipidemia, unspecified hyperlipidemia type -     Comprehensive metabolic panel -     Lipid panel  BMI 45.0-49.9, adult ([facility]) Comments: Reviewed healthy food choices and exercise. Orders: -     TSH -     Hemoglobin A1c -     Insulin, Free (Bioactive)  Vitamin D deficiency -     VITAMIN D 25 Hydroxy (Vit-D Deficiency, Fractures)   Well Adult Exam: Labs ordered: Yes. Patient counseling was done. See below for items discussed. Discussed the patient's BMI.  The BMI BMI is not in the acceptable range; BMI management plan is completed Follow up in 3 months. Breast cancer screening: Mammogram upcoming. Cervical cancer screening: due at 5 years - 2019.   Patient Counseling: [x]    Nutrition: Stressed importance of moderation in sodium/caffeine intake, saturated fat and cholesterol, caloric balance, sufficient intake of fresh fruits, vegetables, fiber, calcium, iron, and 1 mg of folate supplement per day (for females capable of pregnancy).  [x]    Stressed the importance of regular exercise.   [x]    Substance Abuse: Discussed cessation/primary prevention of tobacco, alcohol, or other drug use; driving or other dangerous activities under the influence; availability of treatment for abuse.   [x]    Injury prevention: Discussed safety belts,  safety helmets, smoke detector, smoking near bedding or upholstery.   [x]    Sexuality: Discussed sexually transmitted diseases, partner selection, use of condoms, avoidance of unintended pregnancy  and contraceptive alternatives.  [x]    Dental health: Discussed importance of regular tooth brushing, flossing, and dental visits.  [x]    Health maintenance and immunizations reviewed. Please refer to Health maintenance section.   Briscoe Deutscher, DO [place] [facility] CMA acting as scribe for Dr. Juleen China.  CMA served as Education administrator during this visit. History, Physical, and Plan performed by medical provider. Documentation and orders reviewed and attested to. Briscoe Deutscher, D.O.

## 2016-08-02 NOTE — Assessment & Plan Note (Signed)
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  She did have a fall several months ago but no evidence of scapholunate widening although she does have some widening of the scaphoid trapezial joint.  The cystic structure was quite complex and suspect a synovial cyst which is likely causing some widening of that joint.

## 2016-08-02 NOTE — Assessment & Plan Note (Signed)
Body Healix compression sleeve for the wrist discussed.  Aspiration and injection performed today.  Discussed the high likelihood of return of symptoms.  If any lack of improvement consider further diagnostic imaging with MRI for consideration of surgical excision.

## 2016-08-02 NOTE — Patient Instructions (Signed)
Health Maintenance, Female Adopting a healthy lifestyle and getting preventive care can go a long way to promote health and wellness. Talk with your health care provider about what schedule of regular examinations is right for you. This is a good chance for you to check in with your provider about disease prevention and staying healthy. In between checkups, there are plenty of things you can do on your own. Experts have done a lot of research about which lifestyle changes and preventive measures are most likely to keep you healthy. Ask your health care provider for more information. Weight and diet Eat a healthy diet  Be sure to include plenty of vegetables, fruits, low-fat dairy products, and lean protein.  Do not eat a lot of foods high in solid fats, added sugars, or salt.  Get regular exercise. This is one of the most important things you can do for your health.  Most adults should exercise for at least 150 minutes each week. The exercise should increase your heart rate and make you sweat (moderate-intensity exercise).  Most adults should also do strengthening exercises at least twice a week. This is in addition to the moderate-intensity exercise. Maintain a healthy weight  Body mass index (BMI) is a measurement that can be used to identify possible weight problems. It estimates body fat based on height and weight. Your health care provider can help determine your BMI and help you achieve or maintain a healthy weight.  For females 62 years of age and older:  A BMI below 18.5 is considered underweight.  A BMI of 18.5 to 24.9 is normal.  A BMI of 25 to 29.9 is considered overweight.  A BMI of 30 and above is considered obese. Watch levels of cholesterol and blood lipids  You should start having your blood tested for lipids and cholesterol at 58 years of age, then have this test every 5 years.  You may need to have your cholesterol levels checked more often if:  Your lipid or  cholesterol levels are high.  You are older than 58 years of age.  You are at high risk for heart disease. Cancer screening Lung Cancer  Lung cancer screening is recommended for adults 59-21 years old who are at high risk for lung cancer because of a history of smoking.  A yearly low-dose CT scan of the lungs is recommended for people who:  Currently smoke.  Have quit within the past 15 years.  Have at least a 30-pack-year history of smoking. A pack year is smoking an average of one pack of cigarettes a day for 1 year.  Yearly screening should continue until it has been 15 years since you quit.  Yearly screening should stop if you develop a health problem that would prevent you from having lung cancer treatment. Breast Cancer  Practice breast self-awareness. This means understanding how your breasts normally appear and feel.  It also means doing regular breast self-exams. Let your health care provider know about any changes, no matter how small.  If you are in your 20s or 30s, you should have a clinical breast exam (CBE) by a health care provider every 1-3 years as part of a regular health exam.  If you are 25 or older, have a CBE every year. Also consider having a breast X-ray (mammogram) every year.  If you have a family history of breast cancer, talk to your health care provider about genetic screening.  If you are at high risk for breast cancer, talk  to your health care provider about having an MRI and a mammogram every year.  Breast cancer gene (BRCA) assessment is recommended for women who have family members with BRCA-related cancers. BRCA-related cancers include:  Breast.  Ovarian.  Tubal.  Peritoneal cancers.  Results of the assessment will determine the need for genetic counseling and BRCA1 and BRCA2 testing. Cervical Cancer  Your health care provider may recommend that you be screened regularly for cancer of the pelvic organs (ovaries, uterus, and vagina).  This screening involves a pelvic examination, including checking for microscopic changes to the surface of your cervix (Pap test). You may be encouraged to have this screening done every 3 years, beginning at age 24.  For women ages 66-65, health care providers may recommend pelvic exams and Pap testing every 3 years, or they may recommend the Pap and pelvic exam, combined with testing for human papilloma virus (HPV), every 5 years. Some types of HPV increase your risk of cervical cancer. Testing for HPV may also be done on women of any age with unclear Pap test results.  Other health care providers may not recommend any screening for nonpregnant women who are considered low risk for pelvic cancer and who do not have symptoms. Ask your health care provider if a screening pelvic exam is right for you.  If you have had past treatment for cervical cancer or a condition that could lead to cancer, you need Pap tests and screening for cancer for at least 20 years after your treatment. If Pap tests have been discontinued, your risk factors (such as having a new sexual partner) need to be reassessed to determine if screening should resume. Some women have medical problems that increase the chance of getting cervical cancer. In these cases, your health care provider may recommend more frequent screening and Pap tests. Colorectal Cancer  This type of cancer can be detected and often prevented.  Routine colorectal cancer screening usually begins at 58 years of age and continues through 58 years of age.  Your health care provider may recommend screening at an earlier age if you have risk factors for colon cancer.  Your health care provider may also recommend using home test kits to check for hidden blood in the stool.  A small camera at the end of a tube can be used to examine your colon directly (sigmoidoscopy or colonoscopy). This is done to check for the earliest forms of colorectal cancer.  Routine  screening usually begins at age 41.  Direct examination of the colon should be repeated every 5-10 years through 58 years of age. However, you may need to be screened more often if early forms of precancerous polyps or small growths are found. Skin Cancer  Check your skin from head to toe regularly.  Tell your health care provider about any new moles or changes in moles, especially if there is a change in a mole's shape or color.  Also tell your health care provider if you have a mole that is larger than the size of a pencil eraser.  Always use sunscreen. Apply sunscreen liberally and repeatedly throughout the day.  Protect yourself by wearing long sleeves, pants, a wide-brimmed hat, and sunglasses whenever you are outside. Heart disease, diabetes, and high blood pressure  High blood pressure causes heart disease and increases the risk of stroke. High blood pressure is more likely to develop in:  People who have blood pressure in the high end of the normal range (130-139/85-89 mm Hg).  People who are overweight or obese.  People who are African American.  If you are 18-39 years of age, have your blood pressure checked every 3-5 years. If you are 40 years of age or older, have your blood pressure checked every year. You should have your blood pressure measured twice-once when you are at a hospital or clinic, and once when you are not at a hospital or clinic. Record the average of the two measurements. To check your blood pressure when you are not at a hospital or clinic, you can use:  An automated blood pressure machine at a pharmacy.  A home blood pressure monitor.  If you are between 55 years and 79 years old, ask your health care provider if you should take aspirin to prevent strokes.  Have regular diabetes screenings. This involves taking a blood sample to check your fasting blood sugar level.  If you are at a normal weight and have a low risk for diabetes, have this test once  every three years after 58 years of age.  If you are overweight and have a high risk for diabetes, consider being tested at a younger age or more often. Preventing infection Hepatitis B  If you have a higher risk for hepatitis B, you should be screened for this virus. You are considered at high risk for hepatitis B if:  You were born in a country where hepatitis B is common. Ask your health care provider which countries are considered high risk.  Your parents were born in a high-risk country, and you have not been immunized against hepatitis B (hepatitis B vaccine).  You have HIV or AIDS.  You use needles to inject street drugs.  You live with someone who has hepatitis B.  You have had sex with someone who has hepatitis B.  You get hemodialysis treatment.  You take certain medicines for conditions, including cancer, organ transplantation, and autoimmune conditions. Hepatitis C  Blood testing is recommended for:  Everyone born from 1945 through 1965.  Anyone with known risk factors for hepatitis C. Sexually transmitted infections (STIs)  You should be screened for sexually transmitted infections (STIs) including gonorrhea and chlamydia if:  You are sexually active and are younger than 58 years of age.  You are older than 58 years of age and your health care provider tells you that you are at risk for this type of infection.  Your sexual activity has changed since you were last screened and you are at an increased risk for chlamydia or gonorrhea. Ask your health care provider if you are at risk.  If you do not have HIV, but are at risk, it may be recommended that you take a prescription medicine daily to prevent HIV infection. This is called pre-exposure prophylaxis (PrEP). You are considered at risk if:  You are sexually active and do not regularly use condoms or know the HIV status of your partner(s).  You take drugs by injection.  You are sexually active with a partner  who has HIV. Talk with your health care provider about whether you are at high risk of being infected with HIV. If you choose to begin PrEP, you should first be tested for HIV. You should then be tested every 3 months for as long as you are taking PrEP. Pregnancy  If you are premenopausal and you may become pregnant, ask your health care provider about preconception counseling.  If you may become pregnant, take 400 to 800 micrograms (mcg) of folic acid   every day.  If you want to prevent pregnancy, talk to your health care provider about birth control (contraception). Osteoporosis and menopause  Osteoporosis is a disease in which the bones lose minerals and strength with aging. This can result in serious bone fractures. Your risk for osteoporosis can be identified using a bone density scan.  If you are 4 years of age or older, or if you are at risk for osteoporosis and fractures, ask your health care provider if you should be screened.  Ask your health care provider whether you should take a calcium or vitamin D supplement to lower your risk for osteoporosis.  Menopause may have certain physical symptoms and risks.  Hormone replacement therapy may reduce some of these symptoms and risks. Talk to your health care provider about whether hormone replacement therapy is right for you. Follow these instructions at home:  Schedule regular health, dental, and eye exams.  Stay current with your immunizations.  Do not use any tobacco products including cigarettes, chewing tobacco, or electronic cigarettes.  If you are pregnant, do not drink alcohol.  If you are breastfeeding, limit how much and how often you drink alcohol.  Limit alcohol intake to no more than 1 drink per day for nonpregnant women. One drink equals 12 ounces of beer, 5 ounces of wine, or 1 ounces of hard liquor.  Do not use street drugs.  Do not share needles.  Ask your health care provider for help if you need support  or information about quitting drugs.  Tell your health care provider if you often feel depressed.  Tell your health care provider if you have ever been abused or do not feel safe at home. This information is not intended to replace advice given to you by your health care provider. Make sure you discuss any questions you have with your health care provider. Document Released: 11/21/2010 Document Revised: 10/14/2015 Document Reviewed: 02/09/2015 Elsevier Interactive Patient Education  2017 [employer].

## 2016-08-04 ENCOUNTER — Encounter: Payer: Self-pay | Admitting: Family Medicine

## 2016-08-04 DIAGNOSIS — K0510 Chronic gingivitis, plaque induced: Secondary | ICD-10-CM | POA: Insufficient documentation

## 2016-08-04 DIAGNOSIS — Z9851 Tubal ligation status: Secondary | ICD-10-CM | POA: Insufficient documentation

## 2016-08-04 DIAGNOSIS — R600 Localized edema: Secondary | ICD-10-CM | POA: Insufficient documentation

## 2016-08-04 DIAGNOSIS — Z9049 Acquired absence of other specified parts of digestive tract: Secondary | ICD-10-CM | POA: Insufficient documentation

## 2016-08-04 MED ORDER — METFORMIN HCL ER 500 MG PO TB24: 500.0000 mg | ORAL_TABLET | Freq: Every day | ORAL | 3 refills | Status: DC

## 2016-08-04 MED ORDER — CHOLECALCIFEROL 1.25 MG (50000 UT) PO TABS: ORAL_TABLET | ORAL | 0 refills | Status: DC

## 2016-08-07 LAB — INSULIN, FREE (BIOACTIVE): Insulin, Free: 12.8 u[IU]/mL (ref 1.5–14.9)

## 2016-08-07 NOTE — Progress Notes (Signed)
Thanks for the help. I changed to morbid obesity. Will change scribe attestation to top. EW

## 2016-08-15 DIAGNOSIS — H43393 Other vitreous opacities, bilateral: Secondary | ICD-10-CM | POA: Diagnosis not present

## 2016-08-15 DIAGNOSIS — D3131 Benign neoplasm of right choroid: Secondary | ICD-10-CM | POA: Diagnosis not present

## 2016-08-25 ENCOUNTER — Encounter: Payer: Self-pay | Admitting: Cardiology

## 2016-08-29 ENCOUNTER — Ambulatory Visit
Admission: RE | Admit: 2016-08-29 | Discharge: 2016-08-29 | Disposition: A | Discharge status: Home / Self Care | Payer: Commercial Managed Care - PPO | Source: Ambulatory Visit | Attending: Obstetrics and Gynecology | Admitting: Obstetrics and Gynecology

## 2016-08-29 DIAGNOSIS — Z1231 Encounter for screening mammogram for malignant neoplasm of breast: Secondary | ICD-10-CM | POA: Diagnosis not present

## 2016-09-12 ENCOUNTER — Ambulatory Visit (INDEPENDENT_AMBULATORY_CARE_PROVIDER_SITE_OTHER): Payer: Commercial Managed Care - PPO | Admitting: Cardiology

## 2016-09-12 ENCOUNTER — Encounter: Payer: Self-pay | Admitting: Cardiology

## 2016-09-12 VITALS — BP 128/82 | HR 77 | Ht 65.0 in | Wt 270.8 lb

## 2016-09-12 DIAGNOSIS — E7800 Pure hypercholesterolemia, unspecified: Secondary | ICD-10-CM | POA: Diagnosis not present

## 2016-09-12 DIAGNOSIS — R079 Chest pain, unspecified: Secondary | ICD-10-CM | POA: Diagnosis not present

## 2016-09-12 DIAGNOSIS — I341 Nonrheumatic mitral (valve) prolapse: Secondary | ICD-10-CM | POA: Diagnosis not present

## 2016-09-12 NOTE — Progress Notes (Signed)
Cardiology Office Note    Date:  09/12/2016   ID:  Tricia, [name] 1960-11-15, MRN [id number]  PCP:  Briscoe Deutscher, DO  Cardiologist:  Fransico Him, MD   Chief Complaint  Patient presents with  . Chest Pain    History of Present Illness:  Tricia [name] is a 56 y.o. female with a history of esophageal stricture, hyperlipidemia and MVP who is referred here today by her PCP, Dr. Cinda [name],  for evaluation of atypical chest pain.  She says that this has been going on for several years.  She was diagnosed with MVP by echo years ago and actually saw Dr. Telford Nab in 2012 for workup of similar pain.  She say that her brother just had CABG and repair of his MVP.  She describes the pain as a sharp pain and dull ache that is intermittent and occasionally will go down her left arm and into her left neck.  Nothing exacerbates it and trying to calm herself down helps the pain.  She feels it more at night in bed.  She has some problems with hot flashes at night and with stress but not with CP.  She denies any SOB, PND, orthopnea or DOE.  She occasionally has some LE edema but has a job where she sits all day but on the weekends this is gone.  She does notice her heart racing when she lays in bed and is irregular and hard.  She quit tobacco use in 1992.  She does not drink ETOH.      Past Medical History:  Diagnosis Date  . Colon polyps 2006, 2011   TUBULAR ADENOMA AND HYPERPLASTIC POLYP  . Esophageal stricture 2012  . Glucosuria    with normal blood sugar  . Hepatitis C aby neg viral copies    serology positive but neg viral copies   . HLD (hyperlipidemia)   . Jaundice due to hepatitis    hx when younger unknown cause resolved   . Meningioma ([place])   . Migraine headache    some aura  period related  imitrex helps  . MVP (mitral valve prolapse)    echo 02 normal  . Seizures ([place])   . Ulcer 2012    Past Surgical History:  Procedure Laterality Date  . APPENDECTOMY    .  BUNIONECTOMY     laft  . CHOLECYSTECTOMY  2015   Dr Johney Maine  . CIN D&C conization  2000  . COLONOSCOPY W/ BIOPSIES  2011   SEVERAL   . ESOPHAGOGASTRODUODENOSCOPY  2012  . frontal meningioma resected  1998  . left shoulder dislocation surgery  2005  . TUBAL LIGATION      Current Medications: Current Meds  Medication Sig  . Aspirin-Salicylamide-Caffeine (BC HEADACHE POWDER PO) Take 1 packet by mouth as needed.  . cholecalciferol (VITAMIN D) 1000 UNITS tablet Take 1,000 Units by mouth daily.    . Cholecalciferol 50000 units TABS 50,000 units PO qwk for 8 weeks.  . Cyanocobalamin (VITAMIN B 12 PO) Take 1 tablet by mouth daily.    . OMEGA-3 1000 MG CAPS Take 1 capsule by mouth daily.    Marland Kitchen PHENobarbital (LUMINAL) 97.2 MG tablet Take 97.2 mg by mouth at bedtime.   . SUMAtriptan (IMITREX) 100 MG tablet Take 1 tablet (100 mg total) by mouth once. May repeat in 2 hours if headache persists or recurs.    Allergies:   Codeine sulfate and Phenytoin sodium extended   Social History  Social History  . Marital status: Legally Separated    Spouse name: N/A  . Number of children: 3  . Years of education: N/A   Occupational History  . accounting [employer]   Social History Main Topics  . Smoking status: Former Smoker    Packs/day: 1.50    Types: Cigarettes    Quit date: 05/22/1990  . Smokeless tobacco: Never Used  . Alcohol use Yes     Comment: occas  . Drug use: No  . Sexual activity: Not Asked   Other Topics Concern  . None   Social History Narrative   HH of 1 kid out to college    1 dog   Sleep 5-7 hours   Works Charity fundraiser co now Set designer    Not currnetly working to go back soon  A year    vits omega 3 b complex ca vit     Family History:  The patient's family history includes Colon cancer in her father, mother, paternal aunt, and paternal uncle; Coronary artery disease (age of onset: 19) in her father; Coronary artery disease (age of onset: 47) in  her maternal uncle; Crohn's disease in her daughter; Diabetes in her father; Hyperlipidemia in her father; Other in her brother.   ROS:   Please see the history of present illness.    ROS All other systems reviewed and are negative.  No flowsheet data found.     PHYSICAL EXAM:   VS:  BP 128/82   Pulse 77   Ht 5\' 5"  (1.651 m)   Wt 270 lb 12.8 oz (122.8 kg)   LMP 12/23/2010   SpO2 95%   BMI 45.06 kg/m    GEN: Well nourished, well developed, in no acute distress  HEENT: normal  Neck: no JVD, carotid bruits, or masses Cardiac: RRR; no murmurs, rubs, or gallops,no edema.  Intact distal pulses bilaterally.  Respiratory:  clear to auscultation bilaterally, normal work of breathing GI: soft, nontender, nondistended, + BS MS: no deformity or atrophy  Skin: warm and dry, no rash Neuro:  Alert and Oriented x 3, Strength and sensation are intact Psych: euthymic mood, full affect  Wt Readings from Last 3 Encounters:  09/12/16 270 lb 12.8 oz (122.8 kg)  08/02/16 270 lb 12.8 oz (122.8 kg)  08/02/16 271 lb 9.6 oz (123.2 kg)      Studies/Labs Reviewed:   EKG:  EKG is not ordered today.   Recent Labs: 08/02/2016: ALT 12; BUN 19; Creatinine, Ser 0.85; Hemoglobin 14.2; Platelets 275.0; Potassium 4.2; Sodium 140; TSH 2.72   Lipid Panel    Component Value Date/Time   CHOL 261 (H) 08/02/2016 0831   TRIG 177.0 (H) 08/02/2016 0831   HDL 55.30 08/02/2016 0831   CHOLHDL 5 08/02/2016 0831   VLDL 35.4 08/02/2016 0831   LDLCALC 170 (H) 08/02/2016 0831   LDLDIRECT 137.2 03/11/2012 1116    Additional studies/ records that were reviewed today include:  Office notes from PCP    ASSESSMENT:    1. Chest pain, unspecified type   2. MVP (mitral valve prolapse)   3. Pure hypercholesterolemia      PLAN:  In order of problems listed above:  1. Chest pain that is atypical and has been present for years.  This is likely not related to CAD but she has multiple CRFs including obesity,  significant dyslipidemia with LDL 170, strong family history of of CAD and remote history of tobacco use. 2. MVP - noted  on echo years ago - I will repeat an echo to assess MV 3. Hyperlipidemia - her LDL is very high.  She was recently placed on metformin.  I have strongly encouraged her to follow a low fat diet and consider going on statin therapy.      Medication Adjustments/Labs and Tests Ordered: Current medicines are reviewed at length with the patient today.  Concerns regarding medicines are outlined above.  Medication changes, Labs and Tests ordered today are listed in the Patient Instructions below.  There are no Patient Instructions on file for this visit.   Signed, Fransico Him, MD  09/12/2016 9:12 AM    [facility] [place], [place], [place]  [address] Phone: [phone]; Fax: [phone]

## 2016-09-12 NOTE — Patient Instructions (Signed)
Medication Instructions:  Your physician recommends that you continue on your current medications as directed. Please refer to the Current Medication list given to you today.   Labwork: None  Testing/Procedures: Your physician has requested that you have an echocardiogram. Echocardiography is a painless test that uses sound waves to create images of your heart. It provides your doctor with information about the size and shape of your heart and how well your heart's chambers and valves are working. This procedure takes approximately one hour. There are no restrictions for this procedure.  Dr. Radford Pax recommends you have a CALCIUM SCORE.  Dr. Radford Pax recommends you have a NUCLEAR STRESS TEST.  Follow-Up: Your physician wants you to follow-up in: 6 months with Dr. Mallie Snooks will receive a reminder letter in the mail two months in advance. If you don't receive a letter, please call our office to schedule the follow-up appointment.   Any Other Special Instructions Will Be Listed Below (If Applicable).     If you need a refill on your cardiac medications before your next appointment, please call your pharmacy.

## 2016-09-25 ENCOUNTER — Telehealth (HOSPITAL_COMMUNITY): Payer: Self-pay | Admitting: *Deleted

## 2016-09-25 NOTE — Telephone Encounter (Signed)
Left message on voicemail per DPR in reference to upcoming appointment scheduled on 10/07/16 at 1245 with detailed instructions given per Myocardial Perfusion Study Information Sheet for the test. LM to arrive 15 minutes early, and that it is imperative to arrive on time for appointment to keep from having the test rescheduled. If you need to cancel or reschedule your appointment, please call the office within 24 hours of your appointment. Failure to do so may result in a cancellation of your appointment, and a $50 no show fee. Phone number given for call back for any questions.

## 2016-09-27 ENCOUNTER — Encounter (HOSPITAL_COMMUNITY): Payer: Self-pay

## 2016-09-27 ENCOUNTER — Ambulatory Visit (HOSPITAL_COMMUNITY): Payer: Commercial Managed Care - PPO | Attending: Cardiology

## 2016-09-27 ENCOUNTER — Ambulatory Visit (INDEPENDENT_AMBULATORY_CARE_PROVIDER_SITE_OTHER)
Admission: RE | Admit: 2016-09-27 | Discharge: 2016-09-27 | Disposition: A | Discharge status: Home / Self Care | Payer: Self-pay | Source: Ambulatory Visit | Attending: Cardiology | Admitting: Cardiology

## 2016-09-27 VITALS — Ht 65.0 in | Wt 270.0 lb

## 2016-09-27 DIAGNOSIS — R079 Chest pain, unspecified: Secondary | ICD-10-CM

## 2016-09-27 DIAGNOSIS — I341 Nonrheumatic mitral (valve) prolapse: Secondary | ICD-10-CM | POA: Diagnosis not present

## 2016-09-27 MED ORDER — TECHNETIUM TC 99M TETROFOSMIN IV KIT
33.0000 | PACK | Freq: Once | INTRAVENOUS | Status: AC | PRN
  Administered 2016-09-27: 33 via INTRAVENOUS
  Filled 2016-09-27: qty 33

## 2016-09-28 ENCOUNTER — Ambulatory Visit (HOSPITAL_BASED_OUTPATIENT_CLINIC_OR_DEPARTMENT_OTHER): Payer: Commercial Managed Care - PPO

## 2016-09-28 ENCOUNTER — Ambulatory Visit (HOSPITAL_COMMUNITY): Payer: Commercial Managed Care - PPO | Attending: Cardiology

## 2016-09-28 ENCOUNTER — Other Ambulatory Visit: Payer: Self-pay

## 2016-09-28 DIAGNOSIS — R079 Chest pain, unspecified: Secondary | ICD-10-CM | POA: Insufficient documentation

## 2016-09-28 LAB — MYOCARDIAL PERFUSION IMAGING
CHL CUP NUCLEAR SRS: 3
CHL CUP NUCLEAR SSS: 4
LHR: 0.24
LV dias vol: 106 mL (ref 46–106)
LV sys vol: 46 mL
NUC STRESS TID: 0.92
Peak HR: 160 {beats}/min
Rest HR: 81 {beats}/min
SDS: 1

## 2016-09-28 MED ORDER — TECHNETIUM TC 99M TETROFOSMIN IV KIT
33.0000 | PACK | Freq: Once | INTRAVENOUS | Status: AC | PRN
  Administered 2016-09-28: 33 via INTRAVENOUS
  Filled 2016-09-28: qty 33

## 2016-09-29 ENCOUNTER — Telehealth: Payer: Self-pay | Admitting: Cardiology

## 2016-09-29 NOTE — Telephone Encounter (Signed)
Per DPR form, left message for patient that chest pain can be caused by such things as anxiety, stress, and heart burn. Reiterated to patient that her testing all looked fine. Instructed her to call back if she has further questions or concerns.

## 2016-09-29 NOTE — Telephone Encounter (Signed)
New Message  Pt voiced wanting to know what causes her to have chest pains.

## 2016-10-26 DIAGNOSIS — Z01419 Encounter for gynecological examination (general) (routine) without abnormal findings: Secondary | ICD-10-CM | POA: Diagnosis not present

## 2016-10-26 DIAGNOSIS — Z1389 Encounter for screening for other disorder: Secondary | ICD-10-CM | POA: Diagnosis not present

## 2016-10-26 DIAGNOSIS — Z124 Encounter for screening for malignant neoplasm of cervix: Secondary | ICD-10-CM | POA: Diagnosis not present

## 2016-10-26 LAB — HM PAP SMEAR

## 2016-10-26 LAB — RESULTS CONSOLE HPV: CHL HPV: NEGATIVE

## 2016-10-31 ENCOUNTER — Other Ambulatory Visit: Payer: Self-pay | Admitting: Family Medicine

## 2016-10-31 DIAGNOSIS — E559 Vitamin D deficiency, unspecified: Secondary | ICD-10-CM

## 2016-11-02 NOTE — Telephone Encounter (Signed)
Would you like the patient to start taking the OTC Vitamin D?

## 2016-11-20 ENCOUNTER — Other Ambulatory Visit: Payer: Self-pay

## 2016-11-30 DIAGNOSIS — M67431 Ganglion, right wrist: Secondary | ICD-10-CM | POA: Diagnosis not present

## 2016-12-01 ENCOUNTER — Other Ambulatory Visit: Payer: Self-pay | Admitting: Family Medicine

## 2016-12-01 DIAGNOSIS — R7303 Prediabetes: Secondary | ICD-10-CM

## 2017-04-21 HISTORY — PX: WRIST SURGERY: SHX841

## 2017-04-23 DIAGNOSIS — N760 Acute vaginitis: Secondary | ICD-10-CM | POA: Diagnosis not present

## 2017-05-11 ENCOUNTER — Other Ambulatory Visit: Payer: Self-pay

## 2017-05-11 DIAGNOSIS — M1811 Unilateral primary osteoarthritis of first carpometacarpal joint, right hand: Secondary | ICD-10-CM | POA: Diagnosis not present

## 2017-05-11 DIAGNOSIS — M67431 Ganglion, right wrist: Secondary | ICD-10-CM | POA: Diagnosis not present

## 2017-06-25 DIAGNOSIS — Z5181 Encounter for therapeutic drug level monitoring: Secondary | ICD-10-CM | POA: Diagnosis not present

## 2017-06-29 DIAGNOSIS — Z5181 Encounter for therapeutic drug level monitoring: Secondary | ICD-10-CM | POA: Diagnosis not present

## 2017-06-29 DIAGNOSIS — Z79899 Other long term (current) drug therapy: Secondary | ICD-10-CM | POA: Diagnosis not present

## 2017-06-29 DIAGNOSIS — Z86011 Personal history of benign neoplasm of the brain: Secondary | ICD-10-CM | POA: Diagnosis not present

## 2017-07-26 ENCOUNTER — Other Ambulatory Visit: Payer: Self-pay | Admitting: Obstetrics and Gynecology

## 2017-07-26 DIAGNOSIS — Z1231 Encounter for screening mammogram for malignant neoplasm of breast: Secondary | ICD-10-CM

## 2017-08-16 ENCOUNTER — Encounter: Payer: Commercial Managed Care - PPO | Admitting: Family Medicine

## 2017-08-28 DIAGNOSIS — D3131 Benign neoplasm of right choroid: Secondary | ICD-10-CM | POA: Diagnosis not present

## 2017-08-28 DIAGNOSIS — H35361 Drusen (degenerative) of macula, right eye: Secondary | ICD-10-CM | POA: Diagnosis not present

## 2017-08-28 DIAGNOSIS — H43393 Other vitreous opacities, bilateral: Secondary | ICD-10-CM | POA: Diagnosis not present

## 2017-08-31 ENCOUNTER — Ambulatory Visit
Admission: RE | Admit: 2017-08-31 | Discharge: 2017-08-31 | Disposition: A | Discharge status: Home / Self Care | Payer: Commercial Managed Care - PPO | Source: Ambulatory Visit | Attending: Obstetrics and Gynecology | Admitting: Obstetrics and Gynecology

## 2017-08-31 DIAGNOSIS — Z1231 Encounter for screening mammogram for malignant neoplasm of breast: Secondary | ICD-10-CM

## 2017-09-04 ENCOUNTER — Other Ambulatory Visit: Payer: Self-pay | Admitting: Obstetrics and Gynecology

## 2017-09-04 DIAGNOSIS — Z1231 Encounter for screening mammogram for malignant neoplasm of breast: Secondary | ICD-10-CM

## 2017-09-04 DIAGNOSIS — R928 Other abnormal and inconclusive findings on diagnostic imaging of breast: Secondary | ICD-10-CM

## 2017-09-05 ENCOUNTER — Ambulatory Visit (INDEPENDENT_AMBULATORY_CARE_PROVIDER_SITE_OTHER): Payer: Commercial Managed Care - PPO | Admitting: Family Medicine

## 2017-09-05 ENCOUNTER — Encounter: Payer: Self-pay | Admitting: Family Medicine

## 2017-09-05 VITALS — BP 122/84 | HR 81 | Temp 98.2°F | Ht 65.0 in | Wt 280.0 lb

## 2017-09-05 DIAGNOSIS — E559 Vitamin D deficiency, unspecified: Secondary | ICD-10-CM | POA: Diagnosis not present

## 2017-09-05 DIAGNOSIS — R600 Localized edema: Secondary | ICD-10-CM

## 2017-09-05 DIAGNOSIS — Z0001 Encounter for general adult medical examination with abnormal findings: Secondary | ICD-10-CM

## 2017-09-05 DIAGNOSIS — R5383 Other fatigue: Secondary | ICD-10-CM

## 2017-09-05 DIAGNOSIS — Z79899 Other long term (current) drug therapy: Secondary | ICD-10-CM | POA: Diagnosis not present

## 2017-09-05 DIAGNOSIS — Z0000 Encounter for general adult medical examination without abnormal findings: Secondary | ICD-10-CM

## 2017-09-05 LAB — COMPREHENSIVE METABOLIC PANEL
ALT: 10 U/L (ref 0–35)
AST: 9 U/L (ref 0–37)
Albumin: 4.2 g/dL (ref 3.5–5.2)
Alkaline Phosphatase: 104 U/L (ref 39–117)
BUN: 13 mg/dL (ref 6–23)
CO2: 28 mEq/L (ref 19–32)
Calcium: 9.2 mg/dL (ref 8.4–10.5)
Chloride: 101 mEq/L (ref 96–112)
Creatinine, Ser: 0.82 mg/dL (ref 0.40–1.20)
GFR: 75.79 mL/min (ref >60.00)
Glucose, Bld: 85 mg/dL (ref 70–99)
Potassium: 4.7 mEq/L (ref 3.5–5.1)
Sodium: 138 mEq/L (ref 135–145)
Total Bilirubin: 0.4 mg/dL (ref 0.2–1.2)
Total Protein: 7.1 g/dL (ref 6.0–8.3)

## 2017-09-05 LAB — CBC WITH DIFFERENTIAL/PLATELET
Basophils Absolute: 0.1 10*3/uL (ref 0.0–0.1)
Basophils Relative: 1.3 % (ref 0.0–3.0)
Eosinophils Absolute: 0.2 10*3/uL (ref 0.0–0.7)
Eosinophils Relative: 2.7 % (ref 0.0–5.0)
HCT: 42.5 % (ref 36.0–46.0)
Hemoglobin: 14.1 g/dL (ref 12.0–15.0)
Lymphocytes Relative: 20.7 % (ref 12.0–46.0)
Lymphs Abs: 1.5 10*3/uL (ref 0.7–4.0)
MCHC: 33.1 g/dL (ref 30.0–36.0)
MCV: 86.3 fl (ref 78.0–100.0)
Monocytes Absolute: 0.5 10*3/uL (ref 0.1–1.0)
Monocytes Relative: 7.5 % (ref 3.0–12.0)
Neutro Abs: 4.8 10*3/uL (ref 1.4–7.7)
Neutrophils Relative %: 67.8 % (ref 43.0–77.0)
Platelets: 288 10*3/uL (ref 150.0–400.0)
RBC: 4.92 Mil/uL (ref 3.87–5.11)
RDW: 13.5 % (ref 11.5–15.5)
WBC: 7.1 10*3/uL (ref 4.0–10.5)

## 2017-09-05 LAB — TSH: TSH: 2.13 u[IU]/mL (ref 0.35–4.50)

## 2017-09-05 LAB — VITAMIN D 25 HYDROXY (VIT D DEFICIENCY, FRACTURES): VITD: 10.58 ng/mL — ABNORMAL LOW (ref 30.00–100.00)

## 2017-09-05 LAB — VITAMIN B12: Vitamin B-12: 330 pg/mL (ref 211–911)

## 2017-09-05 LAB — HEMOGLOBIN A1C: Hgb A1c MFr Bld: 5.9 % (ref 4.6–6.5)

## 2017-09-05 MED ORDER — PHENTERMINE HCL 37.5 MG PO TABS: 37.5000 mg | ORAL_TABLET | Freq: Every day | ORAL | 0 refills | Status: DC

## 2017-09-05 MED ORDER — HYDROCHLOROTHIAZIDE 12.5 MG PO CAPS: 12.5000 mg | ORAL_CAPSULE | Freq: Every day | ORAL | 0 refills | Status: DC

## 2017-09-05 NOTE — Progress Notes (Signed)
Subjective:    Tricia [name] is a 57 y.o. female and is here for a comprehensive physical exam.  Patient is interested in weight loss discussion today.  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  She states that she never gets to think about her own health.  She has steadily gained over the years.  She does not watch her diet or exercise.  She does feel tired all the time.  She is interested in weight loss medication and has taken phentermine in the past.  This was about 10 years ago.  She did well with this medication and lost a significant amount of weight.  There are no preventive care reminders to display for this patient.  PMHx, SurgHx, SocialHx, Medications, and Allergies were reviewed in the Visit Navigator and updated as appropriate.   Past Medical History:  Diagnosis Date  . Colon polyps 2006, 2011   TUBULAR ADENOMA AND HYPERPLASTIC POLYP  . Esophageal stricture 2012  . Glucosuria    with normal blood sugar  . Hepatitis C aby neg viral copies    serology positive but neg viral copies   . HLD (hyperlipidemia)   . Jaundice due to hepatitis    hx when younger unknown cause resolved   . Meningioma ([place])   . Migraine headache    some aura  period related  imitrex helps  . MVP (mitral valve prolapse)    echo 02 normal  . Seizures ([place])   . Ulcer 2012    Past Surgical History:  Procedure Laterality Date  . APPENDECTOMY    . BUNIONECTOMY     laft  . CHOLECYSTECTOMY  2015   Dr Johney Maine  . CIN D&C conization  2000  . COLONOSCOPY W/ BIOPSIES  2011   SEVERAL   . ESOPHAGOGASTRODUODENOSCOPY  2012  . frontal meningioma resected  1998  . left shoulder dislocation surgery  2005  . TUBAL LIGATION      Family History  Problem Relation Age of Onset  . Colon cancer Father   . Hyperlipidemia Father   . Coronary artery disease Father 8       MI  . Diabetes Father        elderly  . Colon cancer Paternal Aunt   . Colon cancer Unknown        both GMs   . Colon  cancer Paternal Uncle   . Other Brother        transient global amnesia  . Crohn's disease Daughter   . Coronary artery disease Maternal Uncle 74  . Colon cancer Mother   . Breast cancer Neg Hx    Social History   Tobacco Use  . Smoking status: Former Smoker    Packs/day: 1.50    Types: Cigarettes    Last attempt to quit: 05/22/1990    Years since quitting: 27.3  . Smokeless tobacco: Never Used  Substance Use Topics  . Alcohol use: Yes    Comment: occas  . Drug use: No    Review of Systems:   Pertinent items are noted in the HPI. Otherwise, ROS is negative.  Objective:   BP 122/84   Pulse 81   Temp 98.2 F (36.8 C) (Oral)   Ht 5\' 5"  (1.651 m)   Wt 280 lb (127 kg)   LMP 12/23/2010   SpO2 96%   BMI 46.59 kg/m    Wt Readings from Last 3 Encounters:  09/05/17 280 lb (127 kg)  09/27/16  270 lb (122.5 kg)  09/12/16 270 lb 12.8 oz (122.8 kg)     Ht Readings from Last 3 Encounters:  09/05/17 5\' 5"  (1.651 m)  09/27/16 5\' 5"  (1.651 m)  09/12/16 5\' 5"  (1.651 m)   General appearance: alert, cooperative and appears stated age. Head: normocephalic, without obvious abnormality, atraumatic. Neck: no adenopathy, supple, symmetrical, trachea midline; thyroid not enlarged, symmetric, no tenderness/mass/nodules. Lungs: clear to auscultation bilaterally. Heart: regular rate and rhythm Abdomen: soft, non-tender; no masses,  no organomegaly. Extremities: extremities normal, atraumatic, no cyanosis or edema. Skin: skin color, texture, turgor normal, no rashes or lesions. Lymph: cervical, supraclavicular, and axillary nodes normal; no abnormal inguinal nodes palpated. Neurologic: grossly normal.  EKG with normal sinus rhythm and no ST or T changes.  Assessment/Plan:   Diagnoses and all orders for this visit:  Routine physical examination  Vitamin D deficiency -     VITAMIN D 25 Hydroxy (Vit-D Deficiency, Fractures)  Morbid obesity ([place]) Comments: Weight loss discussion  today.  Patient is ready for change.  Will start below medication.  Follow-up in 1 month for weight check. Orders: -     phentermine (ADIPEX-P) 37.5 MG tablet; Take 1 tablet (37.5 mg total) by mouth daily before breakfast. -     Hemoglobin A1c  Fatigue, unspecified type -     CBC with Differential/Platelet -     Comprehensive metabolic panel -     TSH -     Vitamin B12  Medication management -     EKG 12-Lead  Lower extremity edema -     hydrochlorothiazide (MICROZIDE) 12.5 MG capsule; Take 1 capsule (12.5 mg total) by mouth daily.   Patient Counseling: [x]    Nutrition: Stressed importance of moderation in sodium/caffeine intake, saturated fat and cholesterol, caloric balance, sufficient intake of fresh fruits, vegetables, fiber, calcium, iron, and 1 mg of folate supplement per day (for females capable of pregnancy).  [x]    Stressed the importance of regular exercise.   [x]    Substance Abuse: Discussed cessation/primary prevention of tobacco, alcohol, or other drug use; driving or other dangerous activities under the influence; availability of treatment for abuse.   [x]    Injury prevention: Discussed safety belts, safety helmets, smoke detector, smoking near bedding or upholstery.   [x]    Sexuality: Discussed sexually transmitted diseases, partner selection, use of condoms, avoidance of unintended pregnancy  and contraceptive alternatives.  [x]    Dental health: Discussed importance of regular tooth brushing, flossing, and dental visits.  [x]    Health maintenance and immunizations reviewed. Please refer to Health maintenance section.   Briscoe Deutscher, DO [place]

## 2017-09-06 ENCOUNTER — Ambulatory Visit
Admission: RE | Admit: 2017-09-06 | Discharge: 2017-09-06 | Disposition: A | Discharge status: Home / Self Care | Payer: Commercial Managed Care - PPO | Source: Ambulatory Visit | Attending: Obstetrics and Gynecology | Admitting: Obstetrics and Gynecology

## 2017-09-06 DIAGNOSIS — R928 Other abnormal and inconclusive findings on diagnostic imaging of breast: Secondary | ICD-10-CM

## 2017-09-06 DIAGNOSIS — R921 Mammographic calcification found on diagnostic imaging of breast: Secondary | ICD-10-CM | POA: Diagnosis not present

## 2017-09-10 ENCOUNTER — Telehealth: Payer: Self-pay | Admitting: Family Medicine

## 2017-09-10 MED ORDER — CHOLECALCIFEROL 1.25 MG (50000 UT) PO TABS: ORAL_TABLET | ORAL | 0 refills | Status: DC

## 2017-09-10 NOTE — Telephone Encounter (Signed)
Called patient she found on my chart no questions at this time did not need copy mailed

## 2017-09-10 NOTE — Addendum Note (Signed)
Addended by: Briscoe Deutscher R on: 09/10/2017 09:31 AM   Modules accepted: Orders

## 2017-09-10 NOTE — Telephone Encounter (Signed)
See note   Copied from [place] [id number]. Topic: Quick Communication - Lab Results >> Sep 10, 2017 10:51 AM [name] wrote: Pt is looking for lab results from last Wednesday and would like a copy mailed to her home address [address] [place]  [address]  Best number [place]

## 2017-10-06 ENCOUNTER — Other Ambulatory Visit: Payer: Self-pay | Admitting: Family Medicine

## 2017-10-06 DIAGNOSIS — R600 Localized edema: Secondary | ICD-10-CM

## 2017-10-08 NOTE — Telephone Encounter (Signed)
Please advise on refill.

## 2017-10-10 ENCOUNTER — Encounter: Payer: Self-pay | Admitting: Family Medicine

## 2017-10-10 ENCOUNTER — Ambulatory Visit: Payer: Commercial Managed Care - PPO | Admitting: Family Medicine

## 2017-10-10 VITALS — BP 120/82 | HR 77 | Temp 98.2°F | Ht 65.0 in | Wt 270.2 lb

## 2017-10-10 DIAGNOSIS — E559 Vitamin D deficiency, unspecified: Secondary | ICD-10-CM | POA: Diagnosis not present

## 2017-10-10 DIAGNOSIS — Z6841 Body Mass Index (BMI) 40.0 and over, adult: Secondary | ICD-10-CM

## 2017-10-10 DIAGNOSIS — R600 Localized edema: Secondary | ICD-10-CM

## 2017-10-10 MED ORDER — PHENTERMINE HCL 37.5 MG PO TABS: 37.5000 mg | ORAL_TABLET | Freq: Every day | ORAL | 2 refills | Status: DC

## 2017-10-10 NOTE — Progress Notes (Signed)
Tricia [name] is a 57 y.o. female is here for follow up.  History of Present Illness:   HPI: Patient presents for follow-up of weight loss.  He has been taking one half of the phentermine daily without any side effects.  She has been working on decreasing carbohydrate intake and sodas.  She has not really started exercising yet.  Sleeping well.  She does feel improvement in her energy and feels that the vitamin D has been helping.  Hydrochlorothiazide was prescribed at the last visit but she does not feel like she is needed it.  No chest pain, shortness of breath, palpitations, headaches, dizziness, edema.  There are no preventive care reminders to display for this patient.   Depression screen Tricia [name] 2/9 09/05/2017 07/23/2013  Decreased Interest 0 0  Down, Depressed, Hopeless 0 1  PHQ - 2 Score 0 1   PMHx, SurgHx, SocialHx, FamHx, Medications, and Allergies were reviewed in the Visit Navigator and updated as appropriate.   Patient Active Problem List   Diagnosis Date Noted  . Gingivitis 08/04/2016  . Bilateral leg edema 08/04/2016  . Hx of tubal ligation 08/04/2016  . Hx of appendectomy 08/04/2016  . Hx of cholecystectomy 08/04/2016  . Right wrist pain 08/02/2016  . Ganglion cyst of volar aspect of right wrist 08/02/2016  . Seizure disorder (Tricia [name]) 12/06/2013  . Esophageal reflux 03/19/2012  . 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 Family history of malignant neoplasm of gastrointestinal tract 04/11/2011  . Hyperlipidemia 02/10/2011  . MVP (mitral valve prolapse)   . Meningioma (Tricia [name])   . Migraine headache   . Vitamin D deficiency 12/24/2007  . BMI 45.0-49.9, adult (Tricia [name]) 12/24/2007  . History of colonic polyps 02/27/2007  . Intracranial tumor (Tricia [name]) 05/22/1996   Social History   Tobacco Use  . Smoking status: Former Smoker    Packs/day: 1.50    Types: Cigarettes    Last attempt to quit: 05/22/1990    Years since quitting: 27.4  . Smokeless tobacco: Never Used  Substance Use Topics    . Alcohol use: Yes    Comment: occas  . Drug use: No   Current Medications and Allergies:   .  Aspirin-Salicylamide-Caffeine (BC HEADACHE POWDER PO), Take 1 packet by mouth as needed., Disp: , Rfl:  .  Cholecalciferol 50000 units TABS, 50,000 units PO qwk for 12 weeks., Disp: 12 tablet, Rfl: 0 .  hydrochlorothiazide (MICROZIDE) 12.5 MG capsule, TAKE 1 CAPSULE BY MOUTH EVERY DAY, Disp: 30 capsule, Rfl: 0 .  PHENobarbital (LUMINAL) 97.2 MG tablet, Take 97.2 mg by mouth at bedtime. , Disp: , Rfl:  .  phentermine (ADIPEX-P) 37.5 MG tablet, Take 1 tablet (37.5 mg total) by mouth daily before breakfast., Disp: 30 tablet, Rfl: 0 .  SUMAtriptan (IMITREX) 100 MG tablet, Take 1 tablet (100 mg total) by mouth once. May repeat in 2 hours if headache persists or recurs., Disp: 10 tablet, Rfl: 3   Allergies  Allergen Reactions  . Codeine Sulfate     REACTION: unspecified  . Phenytoin Sodium Extended Rash   Review of Systems   Pertinent items are noted in the HPI. Otherwise, ROS is negative.  Vitals:   Vitals:   10/10/17 0745  BP: 120/82  Pulse: 77  Temp: 98.2 F (36.8 C)  TempSrc: Oral  SpO2: 97%  Weight: 270 lb 3.2 oz (122.6 kg)  Height: 5\' 5"  (1.651 m)     Body mass index is 44.96 kg/m.  Physical Exam:   Physical Exam  Constitutional: She is oriented to person, place, and time. She appears well-developed and well-nourished. No distress.  HENT:  Head: Normocephalic and atraumatic.  Right Ear: External ear normal.  Left Ear: External ear normal.  Nose: Nose normal.  Mouth/Throat: Oropharynx is clear and moist.  Eyes: Pupils are equal, round, and reactive to light. Conjunctivae and EOM are normal.  Neck: Normal range of motion. Neck supple. No thyromegaly present.  Cardiovascular: Normal rate, regular rhythm, normal heart sounds and intact distal pulses.  Pulmonary/Chest: Effort normal and breath sounds normal.  Abdominal: Soft. Bowel sounds are normal.  Musculoskeletal:  Normal range of motion.  Lymphadenopathy:    She has no cervical adenopathy.  Neurological: She is alert and oriented to person, place, and time.  Skin: Skin is warm and dry. Capillary refill takes less than 2 seconds.  Psychiatric: She has a normal mood and affect. Her behavior is normal.  Nursing note and vitals reviewed.   Assessment and Plan:   [name] was seen today for follow-up.  Diagnoses and all orders for this visit:  BMI 45.0-49.9, adult Tricia [name]) Comments: Congratulated patient on 10 pounds of weight loss.  Reviewed healthy eating patterns.  Continue treatment.  Follow-up 3 months. Orders: -     phentermine (ADIPEX-P) 37.5 MG tablet; Take 1 tablet (37.5 mg total) by mouth daily before breakfast.  Vitamin D deficiency Comments: Continue current treatment.  Will recheck in 3 months.  Bilateral leg edema Comments: No concerns today.  Discussed proper use of diuretic in the future.   . Reviewed expectations re: course of current medical issues. . Discussed self-management of symptoms. . Outlined signs and symptoms indicating need for more acute intervention. . Patient verbalized understanding and all questions were answered. Marland Kitchen Health Maintenance issues including appropriate healthy diet, exercise, and smoking avoidance were discussed with patient. . See orders for this visit as documented in the electronic medical record. . Patient received an After Visit Summary.  Briscoe Deutscher, DO Tricia [name], Tricia [name] 10/10/2017  Future Appointments  Date Time Provider [place]  01/11/2018  7:40 AM Briscoe Deutscher, DO Tricia [name] Tricia [name]

## 2017-10-11 ENCOUNTER — Telehealth: Payer: Self-pay | Admitting: Family Medicine

## 2017-10-11 ENCOUNTER — Telehealth: Payer: Self-pay

## 2017-10-11 NOTE — Telephone Encounter (Signed)
Left detailed message on personal voicemail no need to start Metformin was not mentioned in note from yesterdays visit. Continue medications as prescribed. Any questions please call office.

## 2017-10-11 NOTE — Telephone Encounter (Signed)
See note.   Copied from [place] [id number]. Topic: General - Other >> Oct 11, 2017  8:20 AM Yvette Rack wrote: Reason for CRM: pt states that the pharmacy [id number] - , [place] - [address] told her that she need prior authorization for phentermine (ADIPEX-P) 37.5 MG tablet they faxed over a request yesterday

## 2017-10-11 NOTE — Telephone Encounter (Signed)
PA for phentermine approved.  Approval faxed to patient's pharmacy.

## 2017-10-11 NOTE — Telephone Encounter (Signed)
Copied from [place] [id number]. Topic: General - Other >> Oct 11, 2017  8:23 AM Yvette Rack wrote: Reason for CRM: pt calling wanting to know if she need to be on metformin know one stated if she need to be on anything she states that she forgot to say something about it at office visit

## 2017-10-16 NOTE — Telephone Encounter (Signed)
Tricia [name] received  [id number] Ext 04/13/2018  Paper work sent to scan

## 2017-11-01 ENCOUNTER — Other Ambulatory Visit: Payer: Self-pay | Admitting: Family Medicine

## 2017-11-01 DIAGNOSIS — R600 Localized edema: Secondary | ICD-10-CM

## 2017-11-12 ENCOUNTER — Other Ambulatory Visit: Payer: Self-pay | Admitting: Family Medicine

## 2017-11-12 DIAGNOSIS — R600 Localized edema: Secondary | ICD-10-CM

## 2017-11-14 ENCOUNTER — Ambulatory Visit: Payer: Commercial Managed Care - PPO | Admitting: Family Medicine

## 2017-11-14 ENCOUNTER — Encounter: Payer: Self-pay | Admitting: Family Medicine

## 2017-11-14 VITALS — BP 116/84 | HR 70 | Temp 97.9°F

## 2017-11-14 DIAGNOSIS — J0100 Acute maxillary sinusitis, unspecified: Secondary | ICD-10-CM

## 2017-11-14 DIAGNOSIS — J029 Acute pharyngitis, unspecified: Secondary | ICD-10-CM | POA: Diagnosis not present

## 2017-11-14 LAB — POCT RAPID STREP A (OFFICE): RAPID STREP A SCREEN: NEGATIVE

## 2017-11-14 MED ORDER — AZITHROMYCIN 250 MG PO TABS: ORAL_TABLET | ORAL | 0 refills | Status: DC

## 2017-11-14 NOTE — Progress Notes (Signed)
Patient: Tricia [name] MRN: [id number] DOB: Sep 23, 1960 PCP: Briscoe Deutscher, DO     Subjective:  Chief Complaint  Patient presents with  . Ear Pain  . tongue hurts    feels like she has ulcers on back of tongue    HPI: The patient is a 57 y.o. female who presents today for ear pain, sore throat. She had a sore throat that started last week. Yesterday she took her temperature and it was 102. She feels like on eihter side of her tongue it feels swollen or like she an ulcer. She has bilateral ear pain and feels some popping like pain in her maxillary sinuses. She has mild congestion. No sick contacts that she knows of. She has taken mucinex extra strength sinus with no relief. Fever only yesterday. No shortness or breath or coughing. Unsure if she has sinus pain or pressure. Her teeth do not hurt and no weird smell, but she has a weird taste in her mouth. No asthma, copd or smoking history.   Review of Systems  Constitutional: Positive for appetite change and fever.  HENT: Positive for congestion, ear pain and sore throat. Negative for rhinorrhea.   Respiratory: Negative for cough, choking and shortness of breath.   Cardiovascular: Negative for chest pain and leg swelling.  Gastrointestinal: Negative for nausea and vomiting.  Neurological: Negative for dizziness and headaches.  Psychiatric/Behavioral: The patient is not nervous/anxious.     Allergies Patient is allergic to codeine sulfate and phenytoin sodium extended.  Past Medical History Patient  has a past medical history of Colon polyps (2006, 2011), Esophageal stricture (2012), Glucosuria, Hepatitis C aby neg viral copies, HLD (hyperlipidemia), Jaundice due to hepatitis, Meningioma ([place]), Migraine headache, MVP (mitral valve prolapse), Seizures ([place]), and Ulcer (2012).  Surgical History Patient  has a past surgical history that includes Appendectomy; Tubal ligation; frontal meningioma resected (1998); CIN D&C conization (2000);  Bunionectomy; left shoulder dislocation surgery (2005); Colonoscopy w/ biopsies (2011); Esophagogastroduodenoscopy (2012); and Cholecystectomy (2015).  Family History Pateint's family history includes Colon cancer in her father, mother, paternal aunt, paternal uncle, and unknown relative; Coronary artery disease (age of onset: 15) in her father; Coronary artery disease (age of onset: 37) in her maternal uncle; Crohn's disease in her daughter; Diabetes in her father; Hyperlipidemia in her father; Other in her brother.  Social History Patient  reports that she quit smoking about 27 years ago. Her smoking use included cigarettes. She smoked 1.50 packs per day. She has never used smokeless tobacco. She reports that she drinks alcohol. She reports that she does not use drugs.    Objective: Vitals:   11/14/17 0929  BP: 116/84  Pulse: 70  Temp: 97.9 F (36.6 C)  TempSrc: Oral  SpO2: 97%    There is no height or weight on file to calculate BMI.  Physical Exam  Constitutional: She appears well-developed and well-nourished. No distress.  obese  HENT:  Right Ear: External ear normal.  Left Ear: External ear normal.  Nose: Nose normal.  Mouth/Throat: Oropharynx is clear and moist. No oropharyngeal exudate.  TM pearly with light reflex bilaterally. No erythema/fluid/injection.   Tiny ulcer on bilateral sides of tongue. One on each side.   Eyes: Conjunctivae are normal.  Cardiovascular: Normal rate, regular rhythm and normal heart sounds.  Pulmonary/Chest: Effort normal and breath sounds normal. She has no wheezes. She has no rales.  Abdominal: Soft. Bowel sounds are normal.  Lymphadenopathy:    She has no cervical adenopathy (but  ttp).  Vitals reviewed.  Rapid strep: negative     Assessment/plan: 1. Sore throat Likely viral etiology at play with sore throat and ulcers. Ulcers so small we aren't going to do any paste. She does have some sinus symptoms and fever so will cover her with  zpack since she tells me she can not tolerate augmentin due to GI upset. Recommended flonase daily and cool mist humidifier at night to help open her up, fluids and NSAIDs prn.  If not better, worsening symptoms she is to let us know or return to see pcp. No work until fever free x 24 hours.  - POCT rapid strep A  2. Acute non-recurrent maxillary sinusitis See above.     Return if symptoms worsen or fail to improve.   Orma Flaming, MD [place]   11/14/2017

## 2017-11-26 ENCOUNTER — Other Ambulatory Visit: Payer: Self-pay | Admitting: Family Medicine

## 2017-11-26 DIAGNOSIS — E559 Vitamin D deficiency, unspecified: Secondary | ICD-10-CM

## 2017-12-05 ENCOUNTER — Other Ambulatory Visit: Payer: Self-pay | Admitting: Family Medicine

## 2017-12-05 DIAGNOSIS — E559 Vitamin D deficiency, unspecified: Secondary | ICD-10-CM

## 2017-12-22 ENCOUNTER — Other Ambulatory Visit: Payer: Self-pay | Admitting: Family Medicine

## 2017-12-22 DIAGNOSIS — E559 Vitamin D deficiency, unspecified: Secondary | ICD-10-CM

## 2018-01-11 ENCOUNTER — Ambulatory Visit: Payer: Commercial Managed Care - PPO | Admitting: Family Medicine

## 2018-01-11 ENCOUNTER — Encounter: Payer: Self-pay | Admitting: Family Medicine

## 2018-01-11 VITALS — BP 108/66 | HR 73 | Temp 98.5°F | Ht 65.0 in | Wt 254.8 lb

## 2018-01-11 DIAGNOSIS — G40909 Epilepsy, unspecified, not intractable, without status epilepticus: Secondary | ICD-10-CM

## 2018-01-11 DIAGNOSIS — D329 Benign neoplasm of meninges, unspecified: Secondary | ICD-10-CM

## 2018-01-11 DIAGNOSIS — F5101 Primary insomnia: Secondary | ICD-10-CM | POA: Diagnosis not present

## 2018-01-11 DIAGNOSIS — E538 Deficiency of other specified B group vitamins: Secondary | ICD-10-CM

## 2018-01-11 DIAGNOSIS — E559 Vitamin D deficiency, unspecified: Secondary | ICD-10-CM | POA: Diagnosis not present

## 2018-01-11 LAB — COMPREHENSIVE METABOLIC PANEL
ALT: 15 U/L (ref 0–35)
AST: 12 U/L (ref 0–37)
Albumin: 4.1 g/dL (ref 3.5–5.2)
Alkaline Phosphatase: 88 U/L (ref 39–117)
BUN: 16 mg/dL (ref 6–23)
CO2: 29 mEq/L (ref 19–32)
Calcium: 9.2 mg/dL (ref 8.4–10.5)
Chloride: 103 mEq/L (ref 96–112)
Creatinine, Ser: 0.86 mg/dL (ref 0.40–1.20)
GFR: 71.66 mL/min (ref >60.00)
Glucose, Bld: 95 mg/dL (ref 70–99)
Potassium: 4.4 mEq/L (ref 3.5–5.1)
Sodium: 138 mEq/L (ref 135–145)
Total Bilirubin: 0.2 mg/dL (ref 0.2–1.2)
Total Protein: 7.2 g/dL (ref 6.0–8.3)

## 2018-01-11 LAB — VITAMIN D 25 HYDROXY (VIT D DEFICIENCY, FRACTURES): VITD: 31.87 ng/mL (ref 30.00–100.00)

## 2018-01-11 LAB — VITAMIN B12: Vitamin B-12: 1126 pg/mL — ABNORMAL HIGH (ref 211–911)

## 2018-01-11 MED ORDER — PHENTERMINE HCL 37.5 MG PO TABS: 37.5000 mg | ORAL_TABLET | Freq: Every day | ORAL | 2 refills | Status: DC

## 2018-01-11 MED ORDER — TRAZODONE HCL 50 MG PO TABS: 25.0000 mg | ORAL_TABLET | Freq: Every evening | ORAL | 3 refills | Status: DC | PRN

## 2018-01-11 NOTE — Progress Notes (Signed)
Tricia [name] is a 57 y.o. female is here for follow up.  History of Present Illness:   HPI: See Assessment and Plan section for Problem Based Charting of issues discussed today.   Health Maintenance Due  Topic Date Due  . INFLUENZA VACCINE  12/20/2017   Depression screen [facility] 2/9 09/05/2017 07/23/2013  Decreased Interest 0 0  Down, Depressed, Hopeless 0 1  PHQ - 2 Score 0 1   PMHx, SurgHx, SocialHx, FamHx, Medications, and Allergies were reviewed in the Visit Navigator and updated as appropriate.   Patient Active Problem List   Diagnosis Date Noted  . Gingivitis 08/04/2016  . Bilateral leg edema 08/04/2016  . Hx of tubal ligation 08/04/2016  . Hx of appendectomy 08/04/2016  . Hx of cholecystectomy 08/04/2016  . Right wrist pain 08/02/2016  . Ganglion cyst of volar aspect of right wrist 08/02/2016  . Seizure disorder ([place]) 12/06/2013  . Esophageal reflux 03/19/2012  . 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 Family history of malignant neoplasm of gastrointestinal tract 04/11/2011  . Hyperlipidemia 02/10/2011  . MVP (mitral valve prolapse)   . Meningioma ([place])   . Migraine headache   . Vitamin D deficiency 12/24/2007  . BMI 45.0-49.9, adult ([place]) 12/24/2007  . History of colonic polyps 02/27/2007  . Intracranial tumor ([place]) 05/22/1996   Social History   Tobacco Use  . Smoking status: Former Smoker    Packs/day: 1.50    Types: Cigarettes    Last attempt to quit: 05/22/1990    Years since quitting: 27.6  . Smokeless tobacco: Never Used  Substance Use Topics  . Alcohol use: Yes    Comment: occas  . Drug use: No   Current Medications and Allergies:   .  Aspirin-Salicylamide-Caffeine (BC HEADACHE POWDER PO), Take 1 packet by mouth as needed., Disp: , Rfl:  .  hydrochlorothiazide (MICROZIDE) 12.5 MG capsule, TAKE 1 CAPSULE BY MOUTH EVERY DAY, Disp: 90 capsule, Rfl: 1 .  PHENobarbital (LUMINAL) 97.2 MG tablet, Take 97.2 mg by mouth at bedtime. , Disp: , Rfl:  .   phentermine (ADIPEX-P) 37.5 MG tablet, Take 1 tablet (37.5 mg total) by mouth daily before breakfast., Disp: 30 tablet, Rfl: 2 .  SUMAtriptan (IMITREX) 100 MG tablet, Take 1 tablet (100 mg total) by mouth once. May repeat in 2 hours if headache persists or recurs., Disp: 10 tablet, Rfl: 3   Allergies  Allergen Reactions  . Codeine Sulfate     REACTION: unspecified  . Phenytoin Sodium Extended Rash   Review of Systems   Pertinent items are noted in the HPI. Otherwise, ROS is negative.  Vitals:   Vitals:   01/11/18 0748  BP: 108/66  Pulse: 73  Temp: 98.5 F (36.9 C)  TempSrc: Oral  SpO2: 97%  Weight: 254 lb 12.8 oz (115.6 kg)  Height: 5\' 5"  (1.651 m)     Body mass index is 42.4 kg/m.  Physical Exam:   Physical Exam  Constitutional: She appears well-nourished.  HENT:  Head: Normocephalic and atraumatic.  Eyes: Pupils are equal, round, and reactive to light. EOM are normal.  Neck: Normal range of motion. Neck supple.  Cardiovascular: Normal rate, regular rhythm, normal heart sounds and intact distal pulses.  Pulmonary/Chest: Effort normal.  Abdominal: Soft.  Skin: Skin is warm.  Psychiatric: She has a normal mood and affect. Her behavior is normal.  Nursing note and vitals reviewed.  Assessment and Plan:   Tricia [name] was seen today for weight loss.  Diagnoses and  all orders for this visit:  Primary insomnia Comments: Long discuss re: this today. 5 hours sleep nightly. Trouble staying asleep. Wants to avoid meds if possible, but has tried sleep hygeine, melatonin, meditation. Orders: -     traZODone (DESYREL) 50 MG tablet; Take 0.5-1 tablets (25-50 mg total) by mouth at bedtime as needed for sleep.  Morbid obesity ([place]) Comments: Doing well. Compliant with med. Eating healthy foods. Not exercising yet. Will continue with current treatment.  Orders: -     phentermine (ADIPEX-P) 37.5 MG tablet; Take 1 tablet (37.5 mg total) by mouth daily before  breakfast.  Vitamin D deficiency Comments: Retest today. Orders: -     VITAMIN D 25 Hydroxy (Vit-D Deficiency, Fractures)  B12 deficiency Comments: Retest today. Orders: -     Comprehensive metabolic panel -     Vitamin B12  Seizure disorder ([place])  Meningioma ([place])    . Reviewed expectations re: course of current medical issues. . Discussed self-management of symptoms. . Outlined signs and symptoms indicating need for more acute intervention. . Patient verbalized understanding and all questions were answered. Marland Kitchen Health Maintenance issues including appropriate healthy diet, exercise, and smoking avoidance were discussed with patient. . See orders for this visit as documented in the electronic medical record. . Patient received an After Visit Summary.   Briscoe Deutscher, DO [place], [facility] 01/12/2018

## 2018-01-31 DIAGNOSIS — Z1389 Encounter for screening for other disorder: Secondary | ICD-10-CM | POA: Diagnosis not present

## 2018-01-31 DIAGNOSIS — Z01419 Encounter for gynecological examination (general) (routine) without abnormal findings: Secondary | ICD-10-CM | POA: Diagnosis not present

## 2018-01-31 DIAGNOSIS — Z130 Encounter for screening for diseases of the blood and blood-forming organs and certain disorders involving the immune mechanism: Secondary | ICD-10-CM | POA: Diagnosis not present

## 2018-01-31 DIAGNOSIS — N6453 Retraction of nipple: Secondary | ICD-10-CM | POA: Diagnosis not present

## 2018-02-04 ENCOUNTER — Other Ambulatory Visit: Payer: Self-pay | Admitting: Family Medicine

## 2018-02-04 DIAGNOSIS — N6453 Retraction of nipple: Secondary | ICD-10-CM | POA: Insufficient documentation

## 2018-02-04 DIAGNOSIS — F5101 Primary insomnia: Secondary | ICD-10-CM

## 2018-02-04 NOTE — Telephone Encounter (Signed)
OK; 90 day supply.

## 2018-03-20 DIAGNOSIS — M9903 Segmental and somatic dysfunction of lumbar region: Secondary | ICD-10-CM | POA: Diagnosis not present

## 2018-03-20 DIAGNOSIS — M50322 Other cervical disc degeneration at C5-C6 level: Secondary | ICD-10-CM | POA: Diagnosis not present

## 2018-03-20 DIAGNOSIS — M9901 Segmental and somatic dysfunction of cervical region: Secondary | ICD-10-CM | POA: Diagnosis not present

## 2018-03-25 DIAGNOSIS — M9903 Segmental and somatic dysfunction of lumbar region: Secondary | ICD-10-CM | POA: Diagnosis not present

## 2018-03-25 DIAGNOSIS — M9901 Segmental and somatic dysfunction of cervical region: Secondary | ICD-10-CM | POA: Diagnosis not present

## 2018-03-25 DIAGNOSIS — M50322 Other cervical disc degeneration at C5-C6 level: Secondary | ICD-10-CM | POA: Diagnosis not present

## 2018-03-28 ENCOUNTER — Ambulatory Visit (INDEPENDENT_AMBULATORY_CARE_PROVIDER_SITE_OTHER): Payer: Commercial Managed Care - PPO

## 2018-03-28 ENCOUNTER — Telehealth: Payer: Self-pay | Admitting: Family Medicine

## 2018-03-28 ENCOUNTER — Ambulatory Visit: Payer: Commercial Managed Care - PPO | Admitting: Physician Assistant

## 2018-03-28 ENCOUNTER — Encounter: Payer: Self-pay | Admitting: Physician Assistant

## 2018-03-28 VITALS — BP 130/90 | HR 73 | Temp 98.4°F | Ht 65.0 in | Wt 244.5 lb

## 2018-03-28 DIAGNOSIS — G43009 Migraine without aura, not intractable, without status migrainosus: Secondary | ICD-10-CM

## 2018-03-28 DIAGNOSIS — R0781 Pleurodynia: Secondary | ICD-10-CM

## 2018-03-28 MED ORDER — HYDROCODONE-ACETAMINOPHEN 5-325 MG PO TABS: 1.0000 | ORAL_TABLET | Freq: Four times a day (QID) | ORAL | 0 refills | Status: DC | PRN

## 2018-03-28 MED ORDER — SUMATRIPTAN SUCCINATE 100 MG PO TABS: 100.0000 mg | ORAL_TABLET | Freq: Once | ORAL | 0 refills | Status: DC

## 2018-03-28 MED ORDER — KETOROLAC TROMETHAMINE 60 MG/2ML IM SOLN
60.0000 mg | Freq: Once | INTRAMUSCULAR | Status: AC
  Administered 2018-03-28: 60 mg via INTRAMUSCULAR

## 2018-03-28 MED ORDER — CYCLOBENZAPRINE HCL 5 MG PO TABS: 5.0000 mg | ORAL_TABLET | Freq: Three times a day (TID) | ORAL | 0 refills | Status: DC | PRN

## 2018-03-28 NOTE — Progress Notes (Signed)
Tricia [name] is a 57 y.o. female here for a new problem.  I acted as a Education administrator for [employer], PA-C Anselmo Pickler, LPN  History of Present Illness:   Chief Complaint  Patient presents with  . Right rib pain    HPI  Rib pain Pt c/o right side rib pain started on Monday. Pt saw Chiropractor on Friday and again on Monday. When Chiropractor adjusted mid back area on Monday she had immediate rib pain after the adjustment. Pain is increasing, using ice packs, taking Tylenol 1000 mg twice a day no relief. Pt had old muscle relaxer and took 1/2 tablet to help her sleep. She denies sudden onset SOB or fever. She has not been able to valsalva to have BM due to pain and suspects that she is constipated.  Migraine Patient also needs refill on Imitrex. It works well for her. She denies any recent unusual migraines or symptoms -- numbness, tingling, slurred speech, confusion.   Past Medical History:  Diagnosis Date  . Colon polyps 2006, 2011   TUBULAR ADENOMA AND HYPERPLASTIC POLYP  . Esophageal stricture 2012  . Glucosuria    with normal blood sugar  . Hepatitis C aby neg viral copies    serology positive but neg viral copies   . HLD (hyperlipidemia)   . Jaundice due to hepatitis    hx when younger unknown cause resolved   . Meningioma ([place])   . Migraine headache    some aura  period related  imitrex helps  . MVP (mitral valve prolapse)    echo 02 normal  . Seizures ([place])   . Ulcer 2012     Social History   Socioeconomic History  . Marital status: Legally Separated    Spouse name: Not on file  . Number of children: 3  . Years of education: Not on file  . Highest education level: Not on file  Occupational History  . Occupation: Product/process development scientist: [employer]  Social Needs  . Financial resource strain: Not on file  . Food insecurity:    Worry: Not on file    Inability: Not on file  . Transportation needs:    Medical: Not on file    Non-medical: Not on file   Tobacco Use  . Smoking status: Former Smoker    Packs/day: 1.50    Types: Cigarettes    Last attempt to quit: 05/22/1990    Years since quitting: 27.8  . Smokeless tobacco: Never Used  Substance and Sexual Activity  . Alcohol use: Yes    Comment: occas  . Drug use: No  . Sexual activity: Not on file  Lifestyle  . Physical activity:    Days per week: Not on file    Minutes per session: Not on file  . Stress: Not on file  Relationships  . Social connections:    Talks on phone: Not on file    Gets together: Not on file    Attends religious service: Not on file    Active member of club or organization: Not on file    Attends meetings of clubs or organizations: Not on file    Relationship status: Not on file  . Intimate partner violence:    Fear of current or ex partner: Not on file    Emotionally abused: Not on file    Physically abused: Not on file    Forced sexual activity: Not on file  Other Topics Concern  . 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 APPENDECTOMY    . BUNIONECTOMY     laft  . CHOLECYSTECTOMY  2015   Dr Johney Maine  . CIN D&C conization  2000  . COLONOSCOPY W/ BIOPSIES  2011   SEVERAL   . CRANIOTOMY    . CYSTECTOMY  1982  . ESOPHAGOGASTRODUODENOSCOPY  2012  . frontal meningioma resected  1998  . left shoulder dislocation surgery  2005  . TUBAL LIGATION    . WRIST SURGERY  04/21/2017   rt-cyst removal     Family History  Problem Relation Age of Onset  . Colon cancer Father   . Hyperlipidemia Father   . Coronary artery disease Father 48       MI  . Diabetes Father        elderly  . Colon cancer Paternal Aunt   . Colon cancer Unknown        both GMs   . Colon cancer Paternal Uncle   . Other  Brother        transient global amnesia  . Crohn's disease Daughter   . Coronary artery disease Maternal Uncle 74  . Colon cancer Mother   . Breast cancer Neg Hx     Allergies  Allergen Reactions  . Codeine Sulfate     REACTION: unspecified  . Phenytoin Sodium Extended Rash    Current Medications:   Current Outpatient Medications:  .  Aspirin-Salicylamide-Caffeine (BC HEADACHE POWDER PO), Take 1 packet by mouth as needed., Disp: , Rfl:  .  PHENobarbital (LUMINAL) 97.2 MG tablet, Take 97.2 mg by mouth at bedtime. , Disp: , Rfl:  .  phentermine (ADIPEX-P) 37.5 MG tablet, Take 1 tablet (37.5 mg total) by mouth daily before breakfast., Disp: 30 tablet, Rfl: 2 .  traZODone (DESYREL) 50 MG tablet, TAKE 0.5-1 TABLETS (25-50 MG TOTAL) BY MOUTH AT BEDTIME AS NEEDED FOR SLEEP., Disp: 90 tablet, Rfl: 2 .  cyclobenzaprine (FLEXERIL) 5 MG tablet, Take 1 tablet (5 mg total) by mouth 3 (three) times daily as needed for muscle spasms., Disp: 20 tablet, Rfl: 0 .  hydrochlorothiazide (MICROZIDE) 12.5 MG capsule, TAKE 1 CAPSULE BY MOUTH EVERY DAY (Patient not taking: Reported on 03/28/2018), Disp: 90 capsule, Rfl: 1 .  HYDROcodone-acetaminophen (NORCO/VICODIN) 5-325 MG tablet, Take 1 tablet by mouth every 6 (six) hours as needed for moderate pain., Disp: 20 tablet, Rfl: 0 .  SUMAtriptan (IMITREX) 100 MG tablet, Take 1 tablet (100 mg total) by mouth once for 1 dose. May repeat in 2 hours if headache persists or recurs., Disp: 10 tablet, Rfl: 0   Review of Systems:   ROS  Negative unless otherwise specified per HPI.  Vitals:   Vitals:   03/28/18 0904  BP: 130/90  Pulse: 73  Temp: 98.4 F (36.9 C)  TempSrc: Oral  SpO2: 96%  Weight: 244 lb 8 oz (110.9 kg)  Height: 5\' 5"  (1.651 m)     Body mass index is 40.69 kg/m.  Physical Exam:   Physical Exam  Constitutional: She appears well-developed. She is cooperative.  Non-toxic appearance. She does not have a sickly appearance. She does  not appear  ill. No distress.  Cardiovascular: Normal rate, regular rhythm, S1 normal, S2 normal, normal heart sounds and normal pulses.  No LE edema  Pulmonary/Chest: Effort normal and breath sounds normal.  Normal breath sounds bilaterally.  Musculoskeletal:  Tenderness to palpation of anterior portion of bottom of R ribcage. Limited exam 2/2 pain.  Neurological: She is alert. GCS eye subscore is 4. GCS verbal subscore is 5. GCS motor subscore is 6.  Skin: Skin is warm, dry and intact.  Psychiatric: She has a normal mood and affect. Her speech is normal and behavior is normal.  Nursing note and vitals reviewed.   Assessment and Plan:    Fatimata was seen today for right rib pain.  Diagnoses and all orders for this visit:  Rib pain on right side Awaiting radiology read on xray. Lung exam normal and lungs appear well inflated on xray. Received Toradol injection and tolerated well. Despite codeine allergy, she states that she can tolerate hydrocodone, I have given her #20 with zero refills. I have also refilled her flexeril. Follow-up with Dr. Paulla Fore in our office within 2 weeks. ER precautions advised. Also recommended that she start miralax regimen, she looks quite constipated on xray and will be taking hydrocodone -- making this worse. -     DG Ribs Unilateral W/Chest Right; Future -     ketorolac (TORADOL) injection 60 mg  Migraine without aura and without status migrainosus, not intractable Comments: Refill of Imitrex today. Orders: -     SUMAtriptan (IMITREX) 100 MG tablet; Take 1 tablet (100 mg total) by mouth once for 1 dose. May repeat in 2 hours if headache persists or recurs.  Other orders -     cyclobenzaprine (FLEXERIL) 5 MG tablet; Take 1 tablet (5 mg total) by mouth 3 (three) times daily as needed for muscle spasms. -     HYDROcodone-acetaminophen (NORCO/VICODIN) 5-325 MG tablet; Take 1 tablet by mouth every 6 (six) hours as needed for moderate pain.  . Reviewed expectations re:  course of current medical issues. . Discussed self-management of symptoms. . Outlined signs and symptoms indicating need for more acute intervention. . Patient verbalized understanding and all questions were answered. . See orders for this visit as documented in the electronic medical record. . Patient received an After-Visit Summary.  CMA or LPN served as scribe during this visit. History, Physical, and Plan performed by medical provider. The above documentation has been reviewed and is accurate and complete.  Inda Coke, PA-C

## 2018-03-28 NOTE — Patient Instructions (Signed)
It was great to see you!  May use hydrocodone and flexeril as needed.  If you develop worsening pain, fever, or sudden shortness of breath --> go to the ER.  Follow-up with Dr. Paulla Fore here in our office within 2 weeks.   Take care,  Inda Coke PA-C   Rib Contusion A rib contusion is a deep bruise on your rib area. Contusions are the result of a blunt trauma that causes bleeding and injury to the tissues under the skin. A rib contusion may involve bruising of the ribs and of the skin and muscles in the area. The skin overlying the contusion may turn blue, purple, or yellow. Minor injuries will give you a painless contusion, but more severe contusions may stay painful and swollen for a few weeks. What are the causes? A contusion is usually caused by a blow, trauma, or direct force to an area of the body. This often occurs while playing contact sports. What are the signs or symptoms?  Swelling and redness of the injured area.  Discoloration of the injured area.  Tenderness and soreness of the injured area.  Pain with or without movement. How is this diagnosed? The diagnosis can be made by taking a medical history and performing a physical exam. An X-ray, CT scan, or MRI may be needed to determine if there were any associated injuries, such as broken bones (fractures) or internal injuries. How is this treated? Often, the best treatment for a rib contusion is rest. Icing or applying cold compresses to the injured area may help reduce swelling and inflammation. Deep breathing exercises may be recommended to reduce the risk of partial lung collapse and pneumonia. Over-the-counter or prescription medicines may also be recommended for pain control. Follow these instructions at home:  Apply ice to the injured area: ? Put ice in a plastic bag. ? Place a towel between your skin and the bag. ? Leave the ice on for 20 minutes, 2-3 times per day.  Take medicines only as directed by your  health care provider.  Rest the injured area. Avoid strenuous activity and any activities or movements that cause pain. Be careful during activities and avoid bumping the injured area.  Perform deep-breathing exercises as directed by your health care provider.  Do not lift anything that is heavier than 5 lb (2.3 kg) until your health care provider approves.  Do not use any tobacco products, including cigarettes, chewing tobacco, or electronic cigarettes. If you need help quitting, ask your health care provider. Contact a health care provider if:  You have increased bruising or swelling.  You have pain that is not controlled with treatment.  You have a fever. Get help right away if:  You have difficulty breathing or shortness of breath.  You develop a continual cough, or you cough up thick or bloody sputum.  You feel sick to your stomach (nauseous), you throw up (vomit), or you have abdominal pain. This information is not intended to replace advice given to you by your health care provider. Make sure you discuss any questions you have with your health care provider. Document Released: 01/31/2001 Document Revised: 10/14/2015 Document Reviewed: 02/17/2014 Elsevier Interactive Patient Education  [employer].

## 2018-03-28 NOTE — Telephone Encounter (Signed)
Copied from [place] [id number]. Topic: Quick Communication - See Telephone Encounter >> Mar 28, 2018  4:42 PM Rutherford Nail, NT wrote: CRM for notification. See Telephone encounter for: 03/28/18. Patient calling back to obtain x-ray results from 03/28/18

## 2018-03-29 NOTE — Telephone Encounter (Signed)
I sent via MyChart.  Rib xray was normal.

## 2018-03-29 NOTE — Telephone Encounter (Signed)
Called patient gave results  Will call if any questions

## 2018-03-29 NOTE — Telephone Encounter (Signed)
Please advise 

## 2018-04-05 ENCOUNTER — Ambulatory Visit (INDEPENDENT_AMBULATORY_CARE_PROVIDER_SITE_OTHER): Payer: Self-pay

## 2018-04-05 ENCOUNTER — Ambulatory Visit (INDEPENDENT_AMBULATORY_CARE_PROVIDER_SITE_OTHER): Payer: Commercial Managed Care - PPO | Admitting: Orthopaedic Surgery

## 2018-04-05 VITALS — Ht 65.0 in | Wt 244.0 lb

## 2018-04-05 DIAGNOSIS — G8929 Other chronic pain: Secondary | ICD-10-CM | POA: Diagnosis not present

## 2018-04-05 DIAGNOSIS — M545 Low back pain, unspecified: Secondary | ICD-10-CM

## 2018-04-05 DIAGNOSIS — M546 Pain in thoracic spine: Secondary | ICD-10-CM

## 2018-04-05 MED ORDER — HYDROCODONE-ACETAMINOPHEN 5-325 MG PO TABS: 1.0000 | ORAL_TABLET | Freq: Every evening | ORAL | 0 refills | Status: DC | PRN

## 2018-04-05 MED ORDER — METHOCARBAMOL 750 MG PO TABS: 750.0000 mg | ORAL_TABLET | Freq: Two times a day (BID) | ORAL | 0 refills | Status: DC | PRN

## 2018-04-05 NOTE — Progress Notes (Signed)
Office Visit Note   Patient: Tricia [name]           Date of Birth: [birth year]           MRN: [id number] Visit Date: 04/05/2018              Requested by: Tricia [name], [place] Tricia [name] Tricia [name], Tricia [name] PCP: Tricia Deutscher, Tricia [name]   Assessment & Plan: Visit Diagnoses:  1. Chronic midline low back pain without sciatica   2. Pain in thoracic spine     Plan: Impression is occult right 11th or 12th rib fracture from chiropractic manipulation and adjustment.  She is not presenting like she has a pinched nerve but more like a rib fracture.  She is very tender over this area.  I recommend symptomatic treatment.  I gave her prescription for hydrocodone and Robaxin.  She is also complaining of neck stiffness for which I gave her a prescription for physical therapy.  Questions encouraged and answered.  Follow-up as needed.  Activity as tolerated. Total face to face encounter time was greater than 45 minutes and over half of this time was spent in counseling and/or coordination of care.  Follow-Up Instructions: Return in about 6 weeks (around 05/17/2018).   Orders:  Orders Placed This Encounter  Procedures  . XR Lumbar Spine 2-3 Views  . XR Thoracic Spine 2 View  . XR Ribs Unilateral Right   Meds ordered this encounter  Medications  . methocarbamol (ROBAXIN) 750 MG tablet    Sig: Take 1 tablet (750 mg total) by mouth 2 (two) times daily as needed for muscle spasms.    Dispense:  60 tablet    Refill:  0  . HYDROcodone-acetaminophen (NORCO) 5-325 MG tablet    Sig: Take 1 tablet by mouth at bedtime as needed.    Dispense:  20 tablet    Refill:  0      Procedures: No procedures performed   Clinical Data: No additional findings.   Subjective: Chief Complaint  Patient presents with  . Middle Back - Pain  . Lower Back - Pain    Tricia [name] is a 57 year old female who comes in today for acute right sided rib pain from 2 to 3 weeks ago.  She visited a chiropractor  in which she underwent an manipulation of her thoracic spine.  She states that when the chiropractor applied pressure to her thoracic spine she felt an immediate and severe pain in her lower right rib cage area.  She describes the pain as sharp and very tender to palpation.  She denies any numbness and tingling or radicular symptoms.  She denies any shortness of breath or chest pain.   Review of Systems  Constitutional: Negative.   HENT: Negative.   Eyes: Negative.   Respiratory: Negative.   Cardiovascular: Negative.   Endocrine: Negative.   Musculoskeletal: Negative.   Neurological: Negative.   Hematological: Negative.   Psychiatric/Behavioral: Negative.   All other systems reviewed and are negative.    Objective: Vital Signs: Ht 5\' 5"  (1.651 m)   Wt 244 lb (110.7 kg)   LMP 12/23/2010   BMI 40.60 kg/m   Physical Exam  Constitutional: She is oriented to person, place, and time. She appears well-developed and well-nourished.  HENT:  Head: Normocephalic and atraumatic.  Eyes: EOM are normal.  Neck: Neck supple.  Pulmonary/Chest: Effort normal.  Abdominal: Soft.  Neurological: She is alert and oriented to person, place, and time.  Skin:  Skin is warm. Capillary refill takes less than 2 seconds.  Psychiatric: She has a normal mood and affect. Her behavior is normal. Judgment and thought content normal.  Nursing note and vitals reviewed.   Ortho Exam Thoracic and lumbar spine exam show mild tenderness to palpation without any deformities or step-offs.  She is exquisitely tender to the inferior ribs on the right side.  She is in no respiratory distress.  Her lower extremities did not demonstrate any motor or sensory deficits.  Bilateral hyperreflexic patellar tendon reflexes Specialty Comments:  No specialty comments available.  Imaging: Xr Ribs Unilateral Right  Result Date: 04/05/2018 Possible right 12th rib fracture.  Xr Thoracic Spine 2 View  Result Date:  04/05/2018 No acute or structural abnormalities.  Generalized osteopenia.  Xr Lumbar Spine 2-3 Views  Result Date: 04/05/2018 No acute or structural abnormalities.    PMFS History: Patient Active Problem List   Diagnosis Date Noted  . Retraction of nipple 02/04/2018  . Gingivitis 08/04/2016  . Bilateral leg edema 08/04/2016  . Hx of tubal ligation 08/04/2016  . Hx of appendectomy 08/04/2016  . Hx of cholecystectomy 08/04/2016  . Right wrist pain 08/02/2016  . Ganglion cyst of volar aspect of right wrist 08/02/2016  . Seizure disorder (Tricia [name]) 12/06/2013  . Esophageal reflux 03/19/2012  . 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 Family history of malignant neoplasm of gastrointestinal tract 04/11/2011  . Hyperlipidemia 02/10/2011  . MVP (mitral valve prolapse)   . Meningioma (Tricia [name])   . Migraine headache   . Vitamin D deficiency 12/24/2007  . BMI 45.0-49.9, adult (Tricia [name]) 12/24/2007  . History of colonic polyps 02/27/2007  . Intracranial tumor (Tricia [name]) 05/22/1996   Past Medical History:  Diagnosis Date  . Colon polyps 2006, 2011   TUBULAR ADENOMA AND HYPERPLASTIC POLYP  . Esophageal stricture 2012  . Glucosuria    with normal blood sugar  . Hepatitis C aby neg viral copies    serology positive but neg viral copies   . HLD (hyperlipidemia)   . Jaundice due to hepatitis    hx when younger unknown cause resolved   . Meningioma (Tricia [name])   . Migraine headache    some aura  period related  imitrex helps  . MVP (mitral valve prolapse)    echo 02 normal  . Seizures (Tricia [name])   . Ulcer 2012    Family History  Problem Relation Age of Onset  . Colon cancer Father   . Hyperlipidemia Father   . Coronary artery disease Father 73       MI  . Diabetes Father        elderly  . Colon cancer Paternal Aunt   . Colon cancer Unknown        both GMs   . Colon cancer Paternal Uncle   . Other Brother        transient global amnesia  . Crohn's disease Daughter   . Coronary artery disease Maternal  Uncle 74  . Colon cancer Mother   . Breast cancer Neg Hx     Past Surgical History:  Procedure Laterality Date  . APPENDECTOMY    . BUNIONECTOMY     laft  . CHOLECYSTECTOMY  2015   Tricia [name]  . CIN D&C conization  2000  . COLONOSCOPY W/ BIOPSIES  2011   SEVERAL   . CRANIOTOMY    . CYSTECTOMY  1982  . ESOPHAGOGASTRODUODENOSCOPY  2012  . frontal meningioma resected  1998  . left shoulder dislocation surgery  2005  . TUBAL LIGATION    . WRIST SURGERY  04/21/2017   rt-cyst removal    Social History   Occupational History  . Occupation: Product/process development scientist: [employer]  Tobacco Use  . Smoking status: Former Smoker    Packs/day: 1.50    Types: Cigarettes    Last attempt to quit: 05/22/1990    Years since quitting: 27.8  . Smokeless tobacco: Never Used  Substance and Sexual Activity  . Alcohol use: Yes    Comment: occas  . Drug use: No  . Sexual activity: Not on file

## 2018-04-17 ENCOUNTER — Ambulatory Visit: Payer: Commercial Managed Care - PPO | Admitting: Family Medicine

## 2018-04-22 NOTE — Progress Notes (Deleted)
Tricia [name] is a 57 y.o. female is here for follow up.  History of Present Illness:   {CMA SCRIBE ATTESTATION}  HPI: Primary insomnia Comments: Long discuss re: this today. 5 hours sleep nightly. Trouble staying asleep. Wants to avoid meds if possible, but has tried sleep hygeine, melatonin, meditation. Orders: -     traZODone (DESYREL) 50 MG tablet; Take 0.5-1 tablets (25-50 mg total) by mouth at bedtime as needed for sleep.  Morbid obesity ([place]) Comments: Doing well. Compliant with med. Eating healthy foods. Not exercising yet. Will continue with current treatment.  Orders: -     phentermine (ADIPEX-P) 37.5 MG tablet; Take 1 tablet (37.5 mg total) by mouth daily before breakfast.  Vitamin D deficiency Comments: Retest today. Orders: -     VITAMIN D 25 Hydroxy (Vit-D Deficiency, Fractures)  B12 deficiency Comments: Retest today. Orders: -     Comprehensive metabolic panel -     Vitamin B12  Seizure disorder ([place])  Meningioma (HCC)  There are no preventive care reminders to display for this patient. Depression screen [facility] 2/9 09/05/2017 07/23/2013  Decreased Interest 0 0  Down, Depressed, Hopeless 0 1  PHQ - 2 Score 0 1   PMHx, SurgHx, SocialHx, FamHx, Medications, and Allergies were reviewed in the Visit Navigator and updated as appropriate.   Patient Active Problem List   Diagnosis Date Noted  . Retraction of nipple 02/04/2018  . Gingivitis 08/04/2016  . Bilateral leg edema 08/04/2016  . Hx of tubal ligation 08/04/2016  . Hx of appendectomy 08/04/2016  . Hx of cholecystectomy 08/04/2016  . Right wrist pain 08/02/2016  . Ganglion cyst of volar aspect of right wrist 08/02/2016  . Seizure disorder ([place]) 12/06/2013  . Esophageal reflux 03/19/2012  . 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 Family history of malignant neoplasm of gastrointestinal tract 04/11/2011  . Hyperlipidemia 02/10/2011  . MVP (mitral valve prolapse)   . Meningioma ([place])   . Migraine  headache   . Vitamin D deficiency 12/24/2007  . BMI 45.0-49.9, adult ([place]) 12/24/2007  . History of colonic polyps 02/27/2007  . Intracranial tumor ([place]) 05/22/1996   Social History   Tobacco Use  . Smoking status: Former Smoker    Packs/day: 1.50    Types: Cigarettes    Last attempt to quit: 05/22/1990    Years since quitting: 27.9  . Smokeless tobacco: Never Used  Substance Use Topics  . Alcohol use: Yes    Comment: occas  . Drug use: No   Current Medications and Allergies:   Current Outpatient Medications:  .  Aspirin-Salicylamide-Caffeine (BC HEADACHE POWDER PO), Take 1 packet by mouth as needed., Disp: , Rfl:  .  cyclobenzaprine (FLEXERIL) 5 MG tablet, Take 1 tablet (5 mg total) by mouth 3 (three) times daily as needed for muscle spasms., Disp: 20 tablet, Rfl: 0 .  hydrochlorothiazide (MICROZIDE) 12.5 MG capsule, TAKE 1 CAPSULE BY MOUTH EVERY DAY (Patient not taking: Reported on 03/28/2018), Disp: 90 capsule, Rfl: 1 .  HYDROcodone-acetaminophen (NORCO) 5-325 MG tablet, Take 1 tablet by mouth at bedtime as needed., Disp: 20 tablet, Rfl: 0 .  HYDROcodone-acetaminophen (NORCO/VICODIN) 5-325 MG tablet, Take 1 tablet by mouth every 6 (six) hours as needed for moderate pain., Disp: 20 tablet, Rfl: 0 .  methocarbamol (ROBAXIN) 750 MG tablet, Take 1 tablet (750 mg total) by mouth 2 (two) times daily as needed for muscle spasms., Disp: 60 tablet, Rfl: 0 .  PHENobarbital (LUMINAL) 97.2 MG tablet, Take 97.2 mg by  mouth at bedtime. , Disp: , Rfl:  .  phentermine (ADIPEX-P) 37.5 MG tablet, Take 1 tablet (37.5 mg total) by mouth daily before breakfast., Disp: 30 tablet, Rfl: 2 .  SUMAtriptan (IMITREX) 100 MG tablet, Take 1 tablet (100 mg total) by mouth once for 1 dose. May repeat in 2 hours if headache persists or recurs., Disp: 10 tablet, Rfl: 0 .  traZODone (DESYREL) 50 MG tablet, TAKE 0.5-1 TABLETS (25-50 MG TOTAL) BY MOUTH AT BEDTIME AS NEEDED FOR SLEEP., Disp: 90 tablet, Rfl: 2  Allergies   Allergen Reactions  . Codeine Sulfate     REACTION: unspecified  . Phenytoin Sodium Extended Rash   Review of Systems   Pertinent items are noted in the HPI. Otherwise, a complete ROS is negative.  Vitals:  There were no vitals filed for this visit.   There is no height or weight on file to calculate BMI.  Physical Exam:   Physical Exam  Results for orders placed or performed in visit on 01/11/18  Comprehensive metabolic panel  Result Value Ref Range   Sodium 138 135 - 145 mEq/L   Potassium 4.4 3.5 - 5.1 mEq/L   Chloride 103 96 - 112 mEq/L   CO2 29 19 - 32 mEq/L   Glucose, Bld 95 70 - 99 mg/dL   BUN 16 6 - 23 mg/dL   Creatinine, Ser 0.86 0.40 - 1.20 mg/dL   Total Bilirubin 0.2 0.2 - 1.2 mg/dL   Alkaline Phosphatase 88 39 - 117 U/L   AST 12 0 - 37 U/L   ALT 15 0 - 35 U/L   Total Protein 7.2 6.0 - 8.3 g/dL   Albumin 4.1 3.5 - 5.2 g/dL   Calcium 9.2 8.4 - 10.5 mg/dL   GFR 71.66 >60.00 mL/min  Vitamin B12  Result Value Ref Range   Vitamin B-12 1,126 (H) 211 - 911 pg/mL  VITAMIN D 25 Hydroxy (Vit-D Deficiency, Fractures)  Result Value Ref Range   VITD 31.87 30.00 - 100.00 ng/mL    Assessment and Plan:   There are no diagnoses linked to this encounter.  . Orders and follow up as documented in [facility], reviewed diet, exercise and weight control, cardiovascular risk and specific lipid/LDL goals reviewed, reviewed medications and side effects in detail.  . Reviewed expectations re: course of current medical issues. . Outlined signs and symptoms indicating need for more acute intervention. . Patient verbalized understanding and all questions were answered. . Patient received an After Visit Summary.  *** CMA served as Education administrator during this visit. History, Physical, and Plan performed by medical provider. The above documentation has been reviewed and is accurate and complete. Briscoe Deutscher, [place], [place], [place] [facility] 04/22/2018

## 2018-04-23 ENCOUNTER — Ambulatory Visit: Payer: Commercial Managed Care - PPO | Admitting: Family Medicine

## 2018-04-29 ENCOUNTER — Ambulatory Visit: Payer: Commercial Managed Care - PPO | Admitting: Family Medicine

## 2018-04-29 ENCOUNTER — Telehealth: Payer: Self-pay | Admitting: Family Medicine

## 2018-04-29 ENCOUNTER — Encounter: Payer: Self-pay | Admitting: Family Medicine

## 2018-04-29 VITALS — BP 124/88 | HR 84 | Temp 98.0°F | Ht 65.0 in | Wt 238.0 lb

## 2018-04-29 DIAGNOSIS — F5101 Primary insomnia: Secondary | ICD-10-CM

## 2018-04-29 DIAGNOSIS — E538 Deficiency of other specified B group vitamins: Secondary | ICD-10-CM | POA: Insufficient documentation

## 2018-04-29 MED ORDER — PHENTERMINE HCL 37.5 MG PO TABS: 37.5000 mg | ORAL_TABLET | Freq: Every day | ORAL | 2 refills | Status: DC

## 2018-04-29 NOTE — Progress Notes (Signed)
Tricia [name] is a 57 y.o. female is here for follow up.  History of Present Illness:   Tricia [name], CMA acting as scribe for Dr. Briscoe [name].   HPI:   Primary insomnia Trazodone started at last visit. She has found it helpful. Takes 5/7 nights per week.  Morbid obesity ([place]) Doing well. Compliant with med and denies any side effects. Eating healthy foods. She has new dog so is moving more. She is down 16 lbs from last visit in August.   Vitamin D deficiency Patient has been taking Vitamin D 1000 units daily   B12 deficiency Patient has been taking Super B complex daily.   Depression screen Tricia [name] [facility] 2/9 04/29/2018 09/05/2017 07/23/2013  Decreased Interest 0 0 0  Down, Depressed, Hopeless 0 0 1  PHQ - 2 Score 0 0 1  Altered sleeping 0 - -  Tired, decreased energy 0 - -  Change in appetite 0 - -  Feeling bad or failure about yourself  0 - -  Trouble concentrating 0 - -  Moving slowly or fidgety/restless 0 - -  Suicidal thoughts 0 - -  PHQ-9 Score 0 - -  Difficult doing work/chores Not difficult at all - -   PMHx, SurgHx, SocialHx, FamHx, Medications, and Allergies were reviewed in the Visit Navigator and updated as appropriate.   Patient Active Problem List   Diagnosis Date Noted  . Morbid obesity ([place]) 04/29/2018  . B12 deficiency 04/29/2018  . Retraction of nipple 02/04/2018  . Gingivitis, followed by Dentist 08/04/2016  . Bilateral leg edema 08/04/2016  . Hx of tubal ligation 08/04/2016  . Hx of appendectomy 08/04/2016  . Hx of cholecystectomy 08/04/2016  . Ganglion cyst of volar aspect of right wrist 08/02/2016  . Seizure disorder ([place]) 12/06/2013  . Esophageal reflux 03/19/2012  . 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 Family history of malignant neoplasm of gastrointestinal tract 04/11/2011  . Hyperlipidemia 02/10/2011  . MVP (mitral valve prolapse)   . Meningioma ([place])   . Migraine headache   . Vitamin D deficiency 12/24/2007  . BMI 45.0-49.9, adult ([place])  12/24/2007  . History of colonic polyps 02/27/2007  . Intracranial tumor ([place]) 05/22/1996   Social History   Tobacco Use  . Smoking status: Former Smoker    Packs/day: 1.50    Types: Cigarettes    Last attempt to quit: 05/22/1990    Years since quitting: 27.9  . Smokeless tobacco: Never Used  Substance Use Topics  . Alcohol use: Yes    Comment: occas  . Drug use: No   Current Medications and Allergies:   Current Outpatient Medications:  .  Aspirin-Salicylamide-Caffeine (BC HEADACHE POWDER PO), Take 1 packet by mouth as needed., Disp: , Rfl:  .  cyclobenzaprine (FLEXERIL) 5 MG tablet, Take 1 tablet (5 mg total) by mouth 3 (three) times daily as needed for muscle spasms., Disp: 20 tablet, Rfl: 0 .  HYDROcodone-acetaminophen (NORCO) 5-325 MG tablet, Take 1 tablet by mouth at bedtime as needed., Disp: 20 tablet, Rfl: 0 .  methocarbamol (ROBAXIN) 750 MG tablet, Take 1 tablet (750 mg total) by mouth 2 (two) times daily as needed for muscle spasms., Disp: 60 tablet, Rfl: 0 .  PHENobarbital (LUMINAL) 97.2 MG tablet, Take 97.2 mg by mouth at bedtime. , Disp: , Rfl:  .  phentermine (ADIPEX-P) 37.5 MG tablet, Take 1 tablet (37.5 mg total) by mouth daily before breakfast., Disp: 30 tablet, Rfl: 2 .  SUMAtriptan (IMITREX) 100 MG tablet, Take 1  tablet (100 mg total) by mouth once for 1 dose. May repeat in 2 hours if headache persists or recurs., Disp: 10 tablet, Rfl: 0 .  traZODone (DESYREL) 50 MG tablet, TAKE 0.5-1 TABLETS (25-50 MG TOTAL) BY MOUTH AT BEDTIME AS NEEDED FOR SLEEP., Disp: 90 tablet, Rfl: 2 .  hydrochlorothiazide (MICROZIDE) 12.5 MG capsule, TAKE 1 CAPSULE BY MOUTH EVERY DAY (Patient not taking: Reported on 03/28/2018), Disp: 90 capsule, Rfl: 1   Allergies  Allergen Reactions  . Codeine Sulfate     REACTION: unspecified  . Phenytoin Sodium Extended Rash   Review of Systems   Pertinent items are noted in the HPI. Otherwise, a complete ROS is negative.  Vitals:   Vitals:    04/29/18 1503  BP: 124/88  Pulse: 84  Temp: 98 F (36.7 C)  TempSrc: Oral  SpO2: 96%  Weight: 238 lb (108 kg)  Height: 5\' 5"  (1.651 m)     Body mass index is 39.61 kg/m.  Physical Exam:   Physical Exam Vitals signs and nursing note reviewed.  HENT:     Head: Normocephalic and atraumatic.  Eyes:     Pupils: Pupils are equal, round, and reactive to light.  Neck:     Musculoskeletal: Normal range of motion and neck supple.  Cardiovascular:     Rate and Rhythm: Normal rate and regular rhythm.     Heart sounds: Normal heart sounds.  Pulmonary:     Effort: Pulmonary effort is normal.  Abdominal:     Palpations: Abdomen is soft.  Skin:    General: Skin is warm.  Psychiatric:        Behavior: Behavior normal.     Assessment and Plan:   Tricia [name] was seen today for follow-up.  Diagnoses and all orders for this visit:  Primary insomnia Comments: Improved with use of Trazodone. Will continue with medication.  Morbid obesity ([place]) Comments: Doing well. Compliant with med. Eating healthy foods. Exercising yet. Will continue with current treatment. Down 40 pounds since beginning treatment.  Orders: -     phentermine (ADIPEX-P) 37.5 MG tablet; Take 1 tablet (37.5 mg total) by mouth daily before breakfast.    . Orders and follow up as documented in [facility], reviewed diet, exercise and weight control, cardiovascular risk and specific lipid/LDL goals reviewed, reviewed medications and side effects in detail.  . Reviewed expectations re: course of current medical issues. . Outlined signs and symptoms indicating need for more acute intervention. . Patient verbalized understanding and all questions were answered. . Patient received an After Visit Summary.  CMA served as Education administrator during this visit. History, Physical, and Plan performed by medical provider. The above documentation has been reviewed and is accurate and complete. Tricia [name], D.O.  Tricia Deutscher, DO [place],  [place] [facility] 05/02/2018

## 2018-04-29 NOTE — Telephone Encounter (Signed)
Copied from [place] [id number] [id number]. Topic: Quick Communication - See Telephone Encounter >> Apr 29, 2018  3:51 PM Percell Belt A wrote: CRM for notification. See Telephone encounter for: 04/29/18. Pt called in and stated that the phentermine (ADIPEX-P) 37.5 MG tablet [[id number] is not covered and is need a PA completed.    [id number] - [place], [place] [phone] (Phone)

## 2018-04-29 NOTE — Patient Instructions (Signed)
[place] April 2019

## 2018-04-29 NOTE — Telephone Encounter (Signed)
See note

## 2018-05-09 NOTE — Telephone Encounter (Signed)
Tricia [name] Key: [id number] - PA Case ID: [id number] Need help? Call us at [phone]  Status  Sent to [employer]  DrugPhentermine HCl 37.5MG  capsules  FormOptumRx Electronic Prior Authorization Form [phone] NCPDP)

## 2018-07-01 ENCOUNTER — Encounter: Payer: Self-pay | Admitting: Family Medicine

## 2018-07-01 ENCOUNTER — Ambulatory Visit: Payer: Commercial Managed Care - PPO | Admitting: Family Medicine

## 2018-07-01 VITALS — BP 118/64 | HR 78 | Temp 98.0°F | Ht 65.0 in | Wt 230.8 lb

## 2018-07-01 DIAGNOSIS — J0100 Acute maxillary sinusitis, unspecified: Secondary | ICD-10-CM | POA: Diagnosis not present

## 2018-07-01 DIAGNOSIS — J040 Acute laryngitis: Secondary | ICD-10-CM | POA: Diagnosis not present

## 2018-07-01 MED ORDER — BETAMETHASONE SOD PHOS & ACET 6 (3-3) MG/ML IJ SUSP
12.0000 mg | Freq: Once | INTRAMUSCULAR | Status: AC
  Administered 2018-07-01: 12 mg via INTRAMUSCULAR

## 2018-07-01 MED ORDER — CEFDINIR 300 MG PO CAPS: 300.0000 mg | ORAL_CAPSULE | Freq: Two times a day (BID) | ORAL | 0 refills | Status: DC

## 2018-07-01 NOTE — Progress Notes (Signed)
Patient: Tricia [name] MRN: [id number] DOB: 27-May-1960 PCP: Briscoe Deutscher, DO     Subjective:  Chief Complaint  Patient presents with  . Nasal Congestion  . Cough  . Sore Throat    HPI: The patient is a 58 y.o. female who presents today for Uri symptoms. She has had drainage for about 2 weeks and over the weekend started to feel bad. She started to have progressively worsening drainage, coughing, chest hurting from coughing, ear pain, losing her voice. No fever/chills. She has taken claritin D and that is it. No sick contacts that she is aware of. No shortness of breath or wheezing. Cough is dry in nature. No hx of asthma, copd and she is not a smoker. She does have some sinus pain and pressure. Did have a sinus headache on Saturday.   Review of Systems  Constitutional: Positive for fatigue. Negative for chills and fever.  HENT: Positive for congestion, ear pain, postnasal drip, rhinorrhea, sinus pressure, sinus pain, sore throat and trouble swallowing.   Respiratory: Positive for chest tightness. Negative for shortness of breath.   Cardiovascular: Negative for chest pain.  Gastrointestinal: Negative.  Negative for abdominal pain, nausea and vomiting.  Musculoskeletal: Negative for back pain and neck pain.  Neurological: Positive for headaches. Negative for dizziness.  Psychiatric/Behavioral: Positive for sleep disturbance.    Allergies Patient is allergic to codeine sulfate and phenytoin sodium extended.  Past Medical History Patient  has a past medical history of Colon polyps (2006, 2011), Esophageal stricture (2012), Glucosuria, Hepatitis C aby neg viral copies, HLD (hyperlipidemia), Jaundice due to hepatitis, Meningioma ([place]), Migraine headache, MVP (mitral valve prolapse), Seizures ([place]), and Ulcer (2012).  Surgical History Patient  has a past surgical history that includes Appendectomy; Tubal ligation; frontal meningioma resected (1998); CIN D&C conization (2000);  Bunionectomy; left shoulder dislocation surgery (2005); Colonoscopy w/ biopsies (2011); Esophagogastroduodenoscopy (2012); Cholecystectomy (2015); Craniotomy; Wrist surgery (04/21/2017); and Cystectomy (1982).  Family History Pateint's family history includes Colon cancer in her father, mother, paternal aunt, paternal uncle, and unknown relative; Coronary artery disease (age of onset: 42) in her father; Coronary artery disease (age of onset: 75) in her maternal uncle; Crohn's disease in her daughter; Diabetes in her father; Hyperlipidemia in her father; Other in her brother.  Social History Patient  reports that she quit smoking about 28 years ago. Her smoking use included cigarettes. She smoked 1.50 packs per day. She has never used smokeless tobacco. She reports current alcohol use. She reports that she does not use drugs.    Objective: Vitals:   07/01/18 1259  BP: 118/64  Pulse: 78  Temp: 98 F (36.7 C)  TempSrc: Oral  SpO2: 97%  Weight: 230 lb 12.8 oz (104.7 kg)  Height: 5\' 5"  (1.651 m)    Body mass index is 38.41 kg/m.  Physical Exam Vitals signs reviewed.  Constitutional:      Appearance: She is obese.  HENT:     Right Ear: Tympanic membrane and ear canal normal.     Left Ear: Tympanic membrane and ear canal normal.     Nose: Congestion present.     Mouth/Throat:     Tonsils: No tonsillar exudate.     Comments: Cobblestoning on posterior pharynx TTP over sinuses Eyes:     Conjunctiva/sclera: Conjunctivae normal.  Neck:     Musculoskeletal: Normal range of motion.  Cardiovascular:     Rate and Rhythm: Normal rate and regular rhythm.     Heart sounds: Normal heart  sounds.  Pulmonary:     Effort: Pulmonary effort is normal. No respiratory distress.     Breath sounds: Normal breath sounds. No wheezing or rales.  Abdominal:     General: Bowel sounds are normal.     Palpations: Abdomen is soft.  Lymphadenopathy:     Cervical: Cervical adenopathy present.  Skin:     General: Skin is warm.     Capillary Refill: Capillary refill takes less than 2 seconds.  Neurological:     Mental Status: She is alert.        Assessment/plan: 1. Acute maxillary sinusitis, recurrence not specified Steroid shot and start flonase. Discussed would give some time, but sending in antibiotic to take if worsening symptoms/fever or not better after a few days. Already has been 10 days. States she can not tolerate augmentin or doxycycline. Will do course of omnicef. Let us know if any issues.  - betamethasone acetate-betamethasone sodium phosphate (CELESTONE) injection 12 mg  2. Laryngitis Vocal rest, cool mist humidifier and steroid shot.  - betamethasone acetate-betamethasone sodium phosphate (CELESTONE) injection 12 mg      Return if symptoms worsen or fail to improve.   Orma Flaming, MD [place]   07/01/2018

## 2018-07-01 NOTE — Patient Instructions (Signed)
Laryngitis  Laryngitis is irritation and swelling (inflammation) of your vocal cords. This condition causes symptoms such as:  · A change in your voice. It may sound low and hoarse.  · Loss of voice.  · Coughing.  · Sore throat.  · Dry throat.  · Stuffy nose.  Depending on the cause, this condition may go away after a short time or may last for more than 3 weeks. Treatment often involves resting your voice and using medicines to soothe your throat.  Follow these instructions at home:  Medicines  · Take over-the-counter and prescription medicines only as told by your doctor.  · If you were prescribed an antibiotic medicine, take it as told by your doctor. Do not stop taking it even if you start to feel better.  General instructions  · Talk as little as possible. Also avoid whispering.  · Write instead of talking. Do this until your voice is back to normal.  · Drink enough fluid to keep your pee (urine) pale yellow.  · Breathe in moist air. Use a humidifier if you live in a dry climate.  · Do not use any products that have nicotine or tobacco in them, such as cigarettes and e-cigarettes. If you need help quitting, ask your doctor.  Contact a doctor if:  · You have a fever.  · Your pain is worse.  · Your symptoms do not get better in 2 weeks.  Get help right away if:  · You cough up blood.  · You have trouble swallowing.  · You have trouble breathing.  Summary  · Laryngitis is inflammation of your vocal cords.  · This condition causes your voice to sound low and hoarse.  · Rest your voice by talking as little as possible. Also avoid whispering.  This information is not intended to replace advice given to you by your health care provider. Make sure you discuss any questions you have with your health care provider.  Document Released: 04/27/2011 Document Revised: 04/25/2017 Document Reviewed: 04/25/2017  Elsevier Interactive Patient Education © 2019 Elsevier Inc.

## 2018-07-09 ENCOUNTER — Other Ambulatory Visit: Payer: Self-pay | Admitting: Obstetrics and Gynecology

## 2018-07-09 DIAGNOSIS — Z1231 Encounter for screening mammogram for malignant neoplasm of breast: Secondary | ICD-10-CM

## 2018-07-29 NOTE — Progress Notes (Addendum)
TYEISHA DINAN is a 58 y.o. female is here for follow up.  Assessment and Plan:   No problem-specific Assessment & Plan notes found for this encounter.  Orders Placed This Encounter  Procedures  . CBC with Differential/Platelet  . Comprehensive metabolic panel  . Hemoglobin A1c  . Lipid panel   Meds ordered this encounter  Medications  . phentermine (ADIPEX-P) 37.5 MG tablet    Sig: Take 1 tablet (37.5 mg total) by mouth daily before breakfast.    Dispense:  30 tablet    Refill:  3    Subjective:   HPI:   Primary insomnia Taking Trazodone 4/7 nights per week. Helps with sleep.   Morbid obesity ([place]) Compliant with med and denies any side effects. Eating healthy foods. She has new dog so is moving more. she is down 2lbs from last visit in February   Vitamin D deficiency Patient has been taking Vitamin D 1000 units daily   B12 deficiency Patient has been taking Super B complex daily.   Health Maintenance:  There are no preventive care reminders to display for this patient. Depression screen [facility] 2/9 04/29/2018 09/05/2017 07/23/2013  Decreased Interest 0 0 0  Down, Depressed, Hopeless 0 0 1  PHQ - 2 Score 0 0 1  Altered sleeping 0 - -  Tired, decreased energy 0 - -  Change in appetite 0 - -  Feeling bad or failure about yourself  0 - -  Trouble concentrating 0 - -  Moving slowly or fidgety/restless 0 - -  Suicidal thoughts 0 - -  PHQ-9 Score 0 - -  Difficult doing work/chores Not difficult at all - -   PMHx, SurgHx, SocialHx, FamHx, Medications, and Allergies were reviewed in the Visit Navigator and updated as appropriate.   Patient Active Problem List   Diagnosis Date Noted  . Insulin resistance 07/30/2018  . Insomnia 07/30/2018  . Morbid obesity ([place]) 04/29/2018  . B12 deficiency 04/29/2018  . Retraction of nipple, with normal mammogram and [place], followed by GYN 02/04/2018  . Gingivitis, followed by Dentist 08/04/2016  . Bilateral leg edema 08/04/2016   . Hx of tubal ligation 08/04/2016  . Hx of appendectomy 08/04/2016  . Hx of cholecystectomy 08/04/2016  . Ganglion cyst of volar aspect of right wrist 08/02/2016  . Seizure disorder ([place]) 12/06/2013  . Esophageal reflux 03/19/2012  . 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 Family history of malignant neoplasm of gastrointestinal tract 04/11/2011  . Hyperlipidemia 02/10/2011  . MVP (mitral valve prolapse)   . Meningioma ([place]), followed by Dr. Sherwood Gambler given phenobarbital for post surgery seizures, controlled   . Migraine headache, with aura, related to menses, Imitrex abortive   . Vitamin D deficiency, taking daily OTC vitamin D 12/24/2007  . History of colonic polyps, with polypectomy 2006, 2011 02/27/2007   Social History   Tobacco Use  . Smoking status: Former Smoker    Packs/day: 1.50    Types: Cigarettes    Last attempt to quit: 05/22/1990    Years since quitting: 28.2  . Smokeless tobacco: Never Used  Substance Use Topics  . Alcohol use: Yes    Comment: occas  . Drug use: No   Current Medications and Allergies:   .  cyclobenzaprine (FLEXERIL) 5 MG tablet, Take 1 tablet (5 mg total) by mouth 3 (three) times daily as needed for muscle spasms., Disp: 20 tablet, Rfl: 0 .  hydrochlorothiazide (MICROZIDE) 12.5 MG capsule, TAKE 1 CAPSULE BY MOUTH  EVERY DAY, Disp: 90 capsule, Rfl: 1 .  HYDROcodone-acetaminophen (NORCO) 5-325 MG tablet, Take 1 tablet by mouth at bedtime as needed., Disp: 20 tablet, Rfl: 0 .  methocarbamol (ROBAXIN) 750 MG tablet, Take 1 tablet (750 mg total) by mouth 2 (two) times daily as needed for muscle spasms., Disp: 60 tablet, Rfl: 0 .  PHENobarbital (LUMINAL) 97.2 MG tablet, Take 97.2 mg by mouth at bedtime. , Disp: , Rfl:  .  phentermine (ADIPEX-P) 37.5 MG tablet, Take 1 tablet (37.5 mg total) by mouth daily before breakfast., Disp: 30 tablet, Rfl: 2 .  SUMAtriptan (IMITREX) 100 MG tablet, Take 1 tablet (100 mg total) by mouth once for 1  dose. May repeat in 2 hours if headache persists or recurs., Disp: 10 tablet, Rfl: 0 .  traZODone (DESYREL) 50 MG tablet, TAKE 0.5-1 TABLETS (25-50 MG TOTAL) BY MOUTH AT BEDTIME AS NEEDED FOR SLEEP., Disp: 90 tablet, Rfl: 2   Allergies  Allergen Reactions  . Codeine Sulfate     REACTION: unspecified  . Phenytoin Sodium Extended Rash   Review of Systems   Pertinent items are noted in the HPI. Otherwise, ROS is negative.  Vitals:   Vitals:   07/30/18 0802  BP: 120/62  Pulse: 83  Temp: 98.1 F (36.7 C)  TempSrc: Oral  SpO2: 95%  Weight: 229 lb (103.9 kg)  Height: 5\' 5"  (1.651 m)     Body mass index is 38.11 kg/m.   Physical Exam:   General: Cooperative, alert and oriented, well developed, well nourished, in no acute distress. HEENT: Pupils equal round reactive light and extraocular movements intact. Conjunctivae and lids unremarkable. No pallor or cyanosis, dentition good. Neck: No thyromegaly.  Cardiovascular: Regular rhythm. No murmurs appreciated.  Lungs: Normal work of breathing. Clear bilaterally without rales, rhonchi, or wheezing.  Abdomen: Soft, nontender, no masses. Normal bowel sounds. Extremities: No clubbing, cyanosis, erythema. No edema.  Skin: Warm and dry. Neurologic: No focal deficits.  Psychiatric: Normal affect and thought content.   . Reviewed expectations re: course of current medical issues. . Discussed self-management of symptoms. . Outlined signs and symptoms indicating need for more acute intervention. . Patient verbalized understanding and all questions were answered. Marland Kitchen Health Maintenance issues including appropriate healthy diet, exercise, and smoking avoidance were discussed with patient. . See orders for this visit as documented in the electronic medical record. . Patient received an After Visit Summary.  Briscoe Deutscher, DO [place], [facility] 07/30/2018

## 2018-07-30 ENCOUNTER — Ambulatory Visit (INDEPENDENT_AMBULATORY_CARE_PROVIDER_SITE_OTHER): Payer: PRIVATE HEALTH INSURANCE | Admitting: Family Medicine

## 2018-07-30 ENCOUNTER — Encounter: Payer: Self-pay | Admitting: Family Medicine

## 2018-07-30 VITALS — BP 120/62 | HR 83 | Temp 98.1°F | Ht 65.0 in | Wt 229.0 lb

## 2018-07-30 DIAGNOSIS — R600 Localized edema: Secondary | ICD-10-CM | POA: Diagnosis not present

## 2018-07-30 DIAGNOSIS — E8881 Metabolic syndrome: Secondary | ICD-10-CM

## 2018-07-30 DIAGNOSIS — E559 Vitamin D deficiency, unspecified: Secondary | ICD-10-CM | POA: Diagnosis not present

## 2018-07-30 DIAGNOSIS — E7800 Pure hypercholesterolemia, unspecified: Secondary | ICD-10-CM | POA: Diagnosis not present

## 2018-07-30 DIAGNOSIS — F5101 Primary insomnia: Secondary | ICD-10-CM

## 2018-07-30 DIAGNOSIS — G4700 Insomnia, unspecified: Secondary | ICD-10-CM | POA: Insufficient documentation

## 2018-07-30 DIAGNOSIS — E88819 Insulin resistance, unspecified: Secondary | ICD-10-CM

## 2018-07-30 LAB — LIPID PANEL
Cholesterol: 220 mg/dL — ABNORMAL HIGH (ref 0–200)
HDL: 57.5 mg/dL (ref >39.00)
LDL Cholesterol: 146 mg/dL — ABNORMAL HIGH (ref 0–99)
NonHDL: 162.56
Total CHOL/HDL Ratio: 4
Triglycerides: 85 mg/dL (ref 0.0–149.0)
VLDL: 17 mg/dL (ref 0.0–40.0)

## 2018-07-30 LAB — CBC WITH DIFFERENTIAL/PLATELET
Basophils Absolute: 0.1 10*3/uL (ref 0.0–0.1)
Basophils Relative: 1.3 % (ref 0.0–3.0)
Eosinophils Absolute: 0.3 10*3/uL (ref 0.0–0.7)
Eosinophils Relative: 3.7 % (ref 0.0–5.0)
HCT: 42.6 % (ref 36.0–46.0)
Hemoglobin: 14.1 g/dL (ref 12.0–15.0)
Lymphocytes Relative: 22.7 % (ref 12.0–46.0)
Lymphs Abs: 1.8 10*3/uL (ref 0.7–4.0)
MCHC: 33 g/dL (ref 30.0–36.0)
MCV: 87 fl (ref 78.0–100.0)
Monocytes Absolute: 0.6 10*3/uL (ref 0.1–1.0)
Monocytes Relative: 7.3 % (ref 3.0–12.0)
Neutro Abs: 5.2 10*3/uL (ref 1.4–7.7)
Neutrophils Relative %: 65 % (ref 43.0–77.0)
Platelets: 306 10*3/uL (ref 150.0–400.0)
RBC: 4.9 Mil/uL (ref 3.87–5.11)
RDW: 13.1 % (ref 11.5–15.5)
WBC: 8 10*3/uL (ref 4.0–10.5)

## 2018-07-30 LAB — COMPREHENSIVE METABOLIC PANEL
ALT: 15 U/L (ref 0–35)
AST: 12 U/L (ref 0–37)
Albumin: 4.2 g/dL (ref 3.5–5.2)
Alkaline Phosphatase: 98 U/L (ref 39–117)
BUN: 25 mg/dL — ABNORMAL HIGH (ref 6–23)
CO2: 27 mEq/L (ref 19–32)
Calcium: 9.2 mg/dL (ref 8.4–10.5)
Chloride: 104 mEq/L (ref 96–112)
Creatinine, Ser: 0.76 mg/dL (ref 0.40–1.20)
GFR: 77.61 mL/min (ref >60.00)
Glucose, Bld: 86 mg/dL (ref 70–99)
Potassium: 4.6 mEq/L (ref 3.5–5.1)
Sodium: 138 mEq/L (ref 135–145)
Total Bilirubin: 0.2 mg/dL (ref 0.2–1.2)
Total Protein: 6.7 g/dL (ref 6.0–8.3)

## 2018-07-30 LAB — HEMOGLOBIN A1C: Hgb A1c MFr Bld: 5.8 % (ref 4.6–6.5)

## 2018-07-30 MED ORDER — PHENTERMINE HCL 37.5 MG PO TABS: 37.5000 mg | ORAL_TABLET | Freq: Every day | ORAL | 3 refills | Status: DC

## 2018-07-31 ENCOUNTER — Other Ambulatory Visit: Payer: Self-pay | Admitting: Family Medicine

## 2018-07-31 DIAGNOSIS — F5101 Primary insomnia: Secondary | ICD-10-CM

## 2018-09-06 ENCOUNTER — Ambulatory Visit: Payer: PRIVATE HEALTH INSURANCE

## 2018-10-11 ENCOUNTER — Encounter: Payer: PRIVATE HEALTH INSURANCE | Admitting: Family Medicine

## 2018-10-21 ENCOUNTER — Ambulatory Visit
Admission: RE | Admit: 2018-10-21 | Discharge: 2018-10-21 | Disposition: A | Discharge status: Home / Self Care | Payer: PRIVATE HEALTH INSURANCE | Source: Ambulatory Visit | Attending: Obstetrics and Gynecology | Admitting: Obstetrics and Gynecology

## 2018-10-21 ENCOUNTER — Other Ambulatory Visit: Payer: Self-pay

## 2018-10-21 DIAGNOSIS — Z1231 Encounter for screening mammogram for malignant neoplasm of breast: Secondary | ICD-10-CM

## 2018-10-23 ENCOUNTER — Other Ambulatory Visit: Payer: Self-pay | Admitting: Family Medicine

## 2018-10-23 DIAGNOSIS — F5101 Primary insomnia: Secondary | ICD-10-CM

## 2018-11-08 ENCOUNTER — Encounter: Payer: PRIVATE HEALTH INSURANCE | Admitting: Family Medicine

## 2018-11-12 ENCOUNTER — Other Ambulatory Visit: Payer: Self-pay

## 2018-11-12 MED ORDER — PHENTERMINE HCL 37.5 MG PO TABS: 37.5000 mg | ORAL_TABLET | Freq: Every day | ORAL | 0 refills | Status: DC

## 2018-11-12 NOTE — Telephone Encounter (Signed)
Pt had Wt check and CPE with you this week. I have rescheduled her physical with Dr. Jonni Sanger this week. And moved her wt check with you our one month. She will need refill on phentermine sent in have pulled up one months worth can you send in for patient?

## 2018-11-13 ENCOUNTER — Encounter: Payer: PRIVATE HEALTH INSURANCE | Admitting: Family Medicine

## 2018-11-14 ENCOUNTER — Other Ambulatory Visit: Payer: Self-pay

## 2018-11-14 ENCOUNTER — Ambulatory Visit (INDEPENDENT_AMBULATORY_CARE_PROVIDER_SITE_OTHER): Payer: PRIVATE HEALTH INSURANCE | Admitting: Family Medicine

## 2018-11-14 ENCOUNTER — Encounter: Payer: Self-pay | Admitting: Family Medicine

## 2018-11-14 VITALS — BP 122/82 | HR 70 | Temp 98.6°F | Resp 16 | Ht 65.0 in | Wt 228.4 lb

## 2018-11-14 DIAGNOSIS — E559 Vitamin D deficiency, unspecified: Secondary | ICD-10-CM | POA: Diagnosis not present

## 2018-11-14 DIAGNOSIS — E8881 Metabolic syndrome: Secondary | ICD-10-CM | POA: Diagnosis not present

## 2018-11-14 DIAGNOSIS — Z23 Encounter for immunization: Secondary | ICD-10-CM | POA: Diagnosis not present

## 2018-11-14 DIAGNOSIS — Z0000 Encounter for general adult medical examination without abnormal findings: Secondary | ICD-10-CM

## 2018-11-14 DIAGNOSIS — E7800 Pure hypercholesterolemia, unspecified: Secondary | ICD-10-CM

## 2018-11-14 LAB — CBC WITH DIFFERENTIAL/PLATELET
Basophils Absolute: 0.1 10*3/uL (ref 0.0–0.1)
Basophils Relative: 1.6 % (ref 0.0–3.0)
Eosinophils Absolute: 0.2 10*3/uL (ref 0.0–0.7)
Eosinophils Relative: 3.1 % (ref 0.0–5.0)
HCT: 40.5 % (ref 36.0–46.0)
Hemoglobin: 13.6 g/dL (ref 12.0–15.0)
Lymphocytes Relative: 24.1 % (ref 12.0–46.0)
Lymphs Abs: 1.4 10*3/uL (ref 0.7–4.0)
MCHC: 33.5 g/dL (ref 30.0–36.0)
MCV: 86.6 fl (ref 78.0–100.0)
Monocytes Absolute: 0.5 10*3/uL (ref 0.1–1.0)
Monocytes Relative: 7.8 % (ref 3.0–12.0)
Neutro Abs: 3.8 10*3/uL (ref 1.4–7.7)
Neutrophils Relative %: 63.4 % (ref 43.0–77.0)
Platelets: 251 10*3/uL (ref 150.0–400.0)
RBC: 4.67 Mil/uL (ref 3.87–5.11)
RDW: 12.2 % (ref 11.5–15.5)
WBC: 6 10*3/uL (ref 4.0–10.5)

## 2018-11-14 LAB — COMPREHENSIVE METABOLIC PANEL
ALT: 13 U/L (ref 0–35)
AST: 11 U/L (ref 0–37)
Albumin: 4.1 g/dL (ref 3.5–5.2)
Alkaline Phosphatase: 74 U/L (ref 39–117)
BUN: 15 mg/dL (ref 6–23)
CO2: 25 mEq/L (ref 19–32)
Calcium: 8.9 mg/dL (ref 8.4–10.5)
Chloride: 106 mEq/L (ref 96–112)
Creatinine, Ser: 0.7 mg/dL (ref 0.40–1.20)
GFR: 85.25 mL/min (ref >60.00)
Glucose, Bld: 91 mg/dL (ref 70–99)
Potassium: 4 mEq/L (ref 3.5–5.1)
Sodium: 142 mEq/L (ref 135–145)
Total Bilirubin: 0.4 mg/dL (ref 0.2–1.2)
Total Protein: 6.5 g/dL (ref 6.0–8.3)

## 2018-11-14 LAB — LIPID PANEL
Cholesterol: 210 mg/dL — ABNORMAL HIGH (ref 0–200)
HDL: 59.5 mg/dL (ref >39.00)
LDL Cholesterol: 132 mg/dL — ABNORMAL HIGH (ref 0–99)
NonHDL: 150.45
Total CHOL/HDL Ratio: 4
Triglycerides: 93 mg/dL (ref 0.0–149.0)
VLDL: 18.6 mg/dL (ref 0.0–40.0)

## 2018-11-14 LAB — VITAMIN D 25 HYDROXY (VIT D DEFICIENCY, FRACTURES): VITD: 31.12 ng/mL (ref 30.00–100.00)

## 2018-11-14 LAB — HEMOGLOBIN A1C: Hgb A1c MFr Bld: 5.8 % (ref 4.6–6.5)

## 2018-11-14 LAB — TSH: TSH: 2.29 u[IU]/mL (ref 0.35–4.50)

## 2018-11-14 NOTE — Progress Notes (Signed)
Subjective  Chief Complaint  Patient presents with  . Annual Exam    She is fasting, interested in shingles vaccine    HPI: Tricia [name] is a 58 y.o. female who presents to [facility] [place] today for a Female Wellness Visit.   Wellness Visit: annual visit with health maintenance review and exam without Pap, Sees Joycelyn Rua, FNP for GYN care. Screens are up to date   Doing well. Due for shingrix. Working on losing weight; down a pound but it has been difficult due to covid and working from home. Increased stress and decreased motivation.   Assessment  1. Annual physical exam   2. Insulin resistance   3. Pure hypercholesterolemia   4. Vitamin D deficiency, taking daily OTC vitamin D      Plan  Female Wellness Visit:  Age appropriate Health Maintenance and Prevention measures were discussed with patient. Included topics are cancer screening recommendations, ways to keep healthy (see AVS) including dietary and exercise recommendations, regular eye and dental care, use of seat belts, and avoidance of moderate alcohol use and tobacco use.   BMI: discussed patient's BMI and encouraged positive lifestyle modifications to help get to or maintain a target BMI.  HM needs and immunizations were addressed and ordered. See below for orders. See HM and immunization section for updates.shingrix #1 today.   Routine labs and screening tests ordered including cmp, cbc and lipids where appropriate.  Discussed recommendations regarding Vit D and calcium supplementation (see AVS)  Check lipids and a1c given history of elevated glucose and cholesterol.   Follow up: Return in about 6 months (around 05/16/2019) for shingles vaccination, nurse visit please.   Orders Placed This Encounter  Procedures  . Varicella-zoster vaccine IM (Shingrix)  . CBC with Differential/Platelet  . Comprehensive metabolic panel  . Lipid panel  . TSH  . VITAMIN D 25 Hydroxy (Vit-D  Deficiency, Fractures)  . Hemoglobin A1c   No orders of the defined types were placed in this encounter.    Lifestyle: Body mass index is 38.01 kg/m. Wt Readings from Last 3 Encounters:  11/14/18 228 lb 6.4 oz (103.6 kg)  07/30/18 229 lb (103.9 kg)  07/01/18 230 lb 12.8 oz (104.7 kg)   Diet: general Exercise: intermittently,   Patient Active Problem List   Diagnosis Date Noted  . Insulin resistance 07/30/2018  . Insomnia 07/30/2018  . Morbid obesity ([place]) 04/29/2018  . B12 deficiency 04/29/2018  . Retraction of nipple, with normal mammogram and [place], followed by GYN 02/04/2018  . Gingivitis, followed by Dentist 08/04/2016  . Bilateral leg edema 08/04/2016  . Hx of tubal ligation 08/04/2016  . Hx of appendectomy 08/04/2016  . Hx of cholecystectomy 08/04/2016  . Ganglion cyst of volar aspect of right wrist 08/02/2016  . Seizure disorder ([place]) 12/06/2013  . Esophageal reflux 03/19/2012  . 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 Family history of malignant neoplasm of gastrointestinal tract 04/11/2011  . Hyperlipidemia 02/10/2011  . MVP (mitral valve prolapse)   . Meningioma ([place]), followed by Dr. Sherwood Gambler given phenobarbital for post surgery seizures, controlled   . Migraine headache, with aura, related to menses, Imitrex abortive   . Vitamin D deficiency, taking daily OTC vitamin D 12/24/2007  . History of colonic polyps, with polypectomy 2006, 2011 02/27/2007   Health Maintenance  Topic Date Due  . INFLUENZA VACCINE  12/21/2018  . MAMMOGRAM  10/21/2019  . COLONOSCOPY  04/13/2020  . TETANUS/TDAP  02/07/2021  .  PAP SMEAR-Modifier  10/26/2021  . Hepatitis C Screening  Completed  . HIV Screening  Completed   Immunization History  Administered Date(s) Administered  . Influenza Split 02/08/2011, 03/11/2012, 03/07/2013  . Influenza,inj,Quad PF,6+ Mos 03/31/2015, 04/12/2016  . Influenza-Unspecified 03/14/2018  . Td 08/21/1995  . Tdap 02/08/2011  .  Zoster Recombinat (Shingrix) 11/14/2018   We updated and reviewed the patient's past history in detail and it is documented below. Allergies: Patient is allergic to codeine sulfate and phenytoin sodium extended. Past Medical History Patient  has a past medical history of Colon polyps (2006, 2011), Esophageal stricture (2012), Glucosuria, Hepatitis C aby neg viral copies, HLD (hyperlipidemia), Jaundice due to hepatitis, Meningioma ([place]), Migraine headache, MVP (mitral valve prolapse), Seizures ([place]), and Ulcer (2012). Past Surgical History Patient  has a past surgical history that includes Appendectomy; Tubal ligation; frontal meningioma resected (1998); CIN D&C conization (2000); Bunionectomy; left shoulder dislocation surgery (2005); Colonoscopy w/ biopsies (2011); Esophagogastroduodenoscopy (2012); Cholecystectomy (2015); Craniotomy; Wrist surgery (04/21/2017); and Cystectomy (1982). Family History: Patient family history includes Colon cancer in her father, mother, paternal aunt, paternal uncle, and another family member; Coronary artery disease (age of onset: 53) in her father; Coronary artery disease (age of onset: 60) in her maternal uncle; Crohn's disease in her daughter; Diabetes in her father; Hyperlipidemia in her father; Other in her brother. Social History:  Patient  reports that she quit smoking about 28 years ago. Her smoking use included cigarettes. She smoked 1.50 packs per day. She has never used smokeless tobacco. She reports current alcohol use. She reports that she does not use drugs.  Review of Systems: Constitutional: negative for fever or malaise Ophthalmic: negative for photophobia, double vision or loss of vision Cardiovascular: negative for chest pain, dyspnea on exertion, or new LE swelling Respiratory: negative for SOB or persistent cough Gastrointestinal: negative for abdominal pain, change in bowel habits or melena Genitourinary: negative for dysuria or gross  hematuria, no abnormal uterine bleeding or disharge Musculoskeletal: negative for new gait disturbance or muscular weakness Integumentary: negative for new or persistent rashes, no breast lumps Neurological: negative for TIA or stroke symptoms Psychiatric: negative for SI or delusions Allergic/Immunologic: negative for hives  Patient Care Team    Relationship Specialty Notifications Start End  Briscoe Deutscher, DO PCP - General Family Medicine  06/23/16   Jovita Gamma, MD  Neurosurgery  02/08/11    Comment: s/p meningioma and seizure suppression  Inda Castle, MD (Inactive)  Gastroenterology  02/08/11   Newton Pigg, MD  Obstetrics and Gynecology  02/08/11   Lindwood Coke, MD Attending Physician Dermatology  03/11/12   Elesa Massed, NP Nurse Practitioner Obstetrics and Gynecology  02/04/18     Objective  Vitals: BP 122/82   Pulse 70   Temp 98.6 F (37 C) (Oral)   Resp 16   Ht 5\' 5"  (1.651 m)   Wt 228 lb 6.4 oz (103.6 kg)   LMP 12/23/2010   SpO2 95%   BMI 38.01 kg/m  General:  Well developed, well nourished, no acute distress  Psych:  Alert and orientedx3,normal mood and affect HEENT:  Normocephalic, atraumatic, non-icteric sclera, PERRL, oropharynx is clear without mass or exudate, supple neck without adenopathy, mass or thyromegaly Cardiovascular:  Normal S1, S2, RRR without gallop, rub or murmur, nondisplaced PMI Respiratory:  Good breath sounds bilaterally, CTAB with normal respiratory effort Gastrointestinal: normal bowel sounds, soft, non-tender, no noted masses. No HSM MSK: no deformities, contusions. Joints are without erythema or swelling.  Spine and CVA region are nontender Skin:  Warm, no rashes or suspicious lesions noted Neurologic:    Mental status is normal. CN 2-11 are normal. Gross motor and sensory exams are normal. Normal gait. No tremor   Commons side effects, risks, benefits, and alternatives for medications and treatment plan prescribed today were  discussed, and the patient expressed understanding of the given instructions. Patient is instructed to call or message via MyChart if he/she has any questions or concerns regarding our treatment plan. No barriers to understanding were identified. We discussed Red Flag symptoms and signs in detail. Patient expressed understanding regarding what to do in case of urgent or emergency type symptoms.   Medication list was reconciled, printed and provided to the patient in AVS. Patient instructions and summary information was reviewed with the patient as documented in the AVS. This note was prepared with assistance of Dragon voice recognition software. Occasional wrong-word or sound-a-like substitutions may have occurred due to the inherent limitations of voice recognition software

## 2018-11-14 NOTE — Patient Instructions (Addendum)
Please return as requested by Dr. Juleen China.   I will release your lab results to you on your MyChart account with further instructions. Please reply with any questions.   Today you were given your 1 of 2 Shingrix vaccination. Please schedule a visit in 2-6 months for the second shot.   If you have any questions or concerns, please don't hesitate to send me a message via MyChart or call the office at [phone]. Thank you for visiting with [place] today! It's our pleasure caring for you.   Preventive Care 40-64 Years, Female Preventive care refers to lifestyle choices and visits with your health care provider that can promote health and wellness. What does preventive care include?   A yearly physical exam. This is also called an annual well check.  Dental exams once or twice a year.  Routine eye exams. Ask your health care provider how often you should have your eyes checked.  Personal lifestyle choices, including: ? Daily care of your teeth and gums. ? Regular physical activity. ? Eating a healthy diet. ? Avoiding tobacco and drug use. ? Limiting alcohol use. ? Practicing safe sex. ? Taking low-dose aspirin daily starting at age 21. ? Taking vitamin and mineral supplements as recommended by your health care provider. What happens during an annual well check? The services and screenings done by your health care provider during your annual well check will depend on your age, overall health, lifestyle risk factors, and family history of disease. Counseling Your health care provider may ask you questions about your:  Alcohol use.  Tobacco use.  Drug use.  Emotional well-being.  Home and relationship well-being.  Sexual activity.  Eating habits.  Work and work Statistician.  Method of birth control.  Menstrual cycle.  Pregnancy history. Screening You may have the following tests or measurements:  Height, weight, and BMI.  Blood pressure.  Lipid and cholesterol  levels. These may be checked every 5 years, or more frequently if you are over 27 years old.  Skin check.  Lung cancer screening. You may have this screening every year starting at age 36 if you have a 30-pack-year history of smoking and currently smoke or have quit within the past 15 years.  Colorectal cancer screening. All adults should have this screening starting at age 60 and continuing until age 60. Your health care provider may recommend screening at age 71. You will have tests every 1-10 years, depending on your results and the type of screening test. People at increased risk should start screening at an earlier age. Screening tests may include: ? Guaiac-based fecal occult blood testing. ? Fecal immunochemical test (FIT). ? Stool DNA test. ? Virtual colonoscopy. ? Sigmoidoscopy. During this test, a flexible tube with a tiny camera (sigmoidoscope) is used to examine your rectum and lower colon. The sigmoidoscope is inserted through your anus into your rectum and lower colon. ? Colonoscopy. During this test, a long, thin, flexible tube with a tiny camera (colonoscope) is used to examine your entire colon and rectum.  Hepatitis C blood test.  Hepatitis B blood test.  Sexually transmitted disease (STD) testing.  Diabetes screening. This is done by checking your blood sugar (glucose) after you have not eaten for a while (fasting). You may have this done every 1-3 years.  Mammogram. This may be done every 1-2 years. Talk to your health care provider about when you should start having regular mammograms. This may depend on whether you have a family history of breast  cancer.  BRCA-related cancer screening. This may be done if you have a family history of breast, ovarian, tubal, or peritoneal cancers.  Pelvic exam and Pap test. This may be done every 3 years starting at age 49. Starting at age 59, this may be done every 5 years if you have a Pap test in combination with an HPV test.  Bone  density scan. This is done to screen for osteoporosis. You may have this scan if you are at high risk for osteoporosis. Discuss your test results, treatment options, and if necessary, the need for more tests with your health care provider. Vaccines Your health care provider may recommend certain vaccines, such as:  Influenza vaccine. This is recommended every year.  Tetanus, diphtheria, and acellular pertussis (Tdap, Td) vaccine. You may need a Td booster every 10 years.  Varicella vaccine. You may need this if you have not been vaccinated.  Zoster vaccine. You may need this after age 52.  Measles, mumps, and rubella (MMR) vaccine. You may need at least one dose of MMR if you were born in 1957 or later. You may also need a second dose.  Pneumococcal 13-valent conjugate (PCV13) vaccine. You may need this if you have certain conditions and were not previously vaccinated.  Pneumococcal polysaccharide (PPSV23) vaccine. You may need one or two doses if you smoke cigarettes or if you have certain conditions.  Meningococcal vaccine. You may need this if you have certain conditions.  Hepatitis A vaccine. You may need this if you have certain conditions or if you travel or work in places where you may be exposed to hepatitis A.  Hepatitis B vaccine. You may need this if you have certain conditions or if you travel or work in places where you may be exposed to hepatitis B.  Haemophilus influenzae type b (Hib) vaccine. You may need this if you have certain conditions. Talk to your health care provider about which screenings and vaccines you need and how often you need them. This information is not intended to replace advice given to you by your health care provider. Make sure you discuss any questions you have with your health care provider. Document Released: 06/04/2015 Document Revised: 06/28/2017 Document Reviewed: 03/09/2015 Elsevier Interactive Patient Education  2019 [employer].

## 2018-12-10 NOTE — Progress Notes (Signed)
Tricia [name] is a 58 y.o. female is here for follow up.  History of Present Illness:   HPI:   Primary insomnia Taking Trazodone 4/7 nights per week. Helps with sleep.   Morbid obesity ([place]) Held medication this week. Can tell a difference. Not working now (as of last month). Lacking schedule. Some depression. Eating more junk food.   Vitamin D deficiency Patient has been taking Vitamin D 1000 units daily  B12 deficiency Patient has been taking Super B complex daily.  There are no preventive care reminders to display for this patient.   Depression screen [facility] 2/9 11/14/2018 04/29/2018 09/05/2017  Decreased Interest 0 0 0  Down, Depressed, Hopeless 0 0 0  PHQ - 2 Score 0 0 0  Altered sleeping - 0 -  Tired, decreased energy - 0 -  Change in appetite - 0 -  Feeling bad or failure about yourself  - 0 -  Trouble concentrating - 0 -  Moving slowly or fidgety/restless - 0 -  Suicidal thoughts - 0 -  PHQ-9 Score - 0 -  Difficult doing work/chores - Not difficult at all -   PMHx, SurgHx, SocialHx, FamHx, Medications, and Allergies were reviewed in the Visit Navigator and updated as appropriate.   Patient Active Problem List   Diagnosis Date Noted  . Insulin resistance 07/30/2018  . Insomnia 07/30/2018  . Morbid obesity ([place]) 04/29/2018  . B12 deficiency 04/29/2018  . Retraction of nipple, with normal mammogram and [place], followed by GYN 02/04/2018  . Gingivitis, followed by Dentist 08/04/2016  . Bilateral leg edema 08/04/2016  . Hx of tubal ligation 08/04/2016  . Hx of appendectomy 08/04/2016  . Hx of cholecystectomy 08/04/2016  . Ganglion cyst of volar aspect of right wrist 08/02/2016  . Seizure disorder ([place]) 12/06/2013  . Esophageal reflux 03/19/2012  . 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 Family history of malignant neoplasm of gastrointestinal tract 04/11/2011  . Hyperlipidemia 02/10/2011  . MVP (mitral valve prolapse)   . Meningioma  ([place]), followed by Dr. Sherwood Gambler given phenobarbital for post surgery seizures, controlled   . Migraine headache, with aura, related to menses, Imitrex abortive   . Vitamin D deficiency, taking daily OTC vitamin D 12/24/2007  . History of colonic polyps, with polypectomy 2006, 2011 02/27/2007   Social History   Tobacco Use  . Smoking status: Former Smoker    Packs/day: 1.50    Types: Cigarettes    Quit date: 05/22/1990    Years since quitting: 28.5  . Smokeless tobacco: Never Used  Substance Use Topics  . Alcohol use: Yes    Comment: occas  . Drug use: No   Current Medications and Allergies   .  Aspirin-Salicylamide-Caffeine (BC HEADACHE POWDER PO), Take 1 packet by mouth as needed., Disp: , Rfl:  .  hydrochlorothiazide (MICROZIDE) 12.5 MG capsule, TAKE 1 CAPSULE BY MOUTH EVERY DAY, Disp: 90 capsule, Rfl: 1 .  PHENobarbital (LUMINAL) 97.2 MG tablet, Take 97.2 mg by mouth at bedtime. , Disp: , Rfl:  .  phentermine (ADIPEX-P) 37.5 MG tablet, Take 1 tablet (37.5 mg total) by mouth daily before breakfast., Disp: 30 tablet, Rfl: 0 .  SUMAtriptan (IMITREX) 100 MG tablet, Take 1 tablet (100 mg total) by mouth once for 1 dose. May repeat in 2 hours if headache persists or recurs., Disp: 10 tablet, Rfl: 0 .  traZODone (DESYREL) 50 MG tablet, TAKE 0.5-1 TABLETS (25-50 MG TOTAL) BY MOUTH AT BEDTIME AS NEEDED FOR SLEEP.,  Disp: 90 tablet, Rfl: 0   Allergies  Allergen Reactions  . Codeine Sulfate     REACTION: unspecified  . Phenytoin Sodium Extended Rash   Review of Systems   Pertinent items are noted in the HPI. Otherwise, a complete ROS is negative.  Vitals   Vitals:   12/11/18 0832  BP: 102/80  Pulse: 85  Temp: 98.1 F (36.7 C)  TempSrc: Oral  SpO2: 97%  Weight: 232 lb (105.2 kg)  Height: 5\' 5"  (1.651 m)     Body mass index is 38.61 kg/m.  Physical Exam   Physical Exam Vitals signs and nursing note reviewed.  HENT:     Head: Normocephalic and atraumatic.  Eyes:      Pupils: Pupils are equal, round, and reactive to light.  Neck:     Musculoskeletal: Normal range of motion and neck supple.  Cardiovascular:     Rate and Rhythm: Normal rate and regular rhythm.     Heart sounds: Normal heart sounds.  Pulmonary:     Effort: Pulmonary effort is normal.  Abdominal:     Palpations: Abdomen is soft.  Skin:    General: Skin is warm.  Psychiatric:        Behavior: Behavior normal.    Results for orders placed or performed in visit on 11/14/18  CBC with Differential/Platelet  Result Value Ref Range   WBC 6.0 4.0 - 10.5 K/uL   RBC 4.67 3.87 - 5.11 Mil/uL   Hemoglobin 13.6 12.0 - 15.0 g/dL   HCT 40.5 36.0 - 46.0 %   MCV 86.6 78.0 - 100.0 fl   MCHC 33.5 30.0 - 36.0 g/dL   RDW 12.2 11.5 - 15.5 %   Platelets 251.0 150.0 - 400.0 K/uL   Neutrophils Relative % 63.4 43.0 - 77.0 %   Lymphocytes Relative 24.1 12.0 - 46.0 %   Monocytes Relative 7.8 3.0 - 12.0 %   Eosinophils Relative 3.1 0.0 - 5.0 %   Basophils Relative 1.6 0.0 - 3.0 %   Neutro Abs 3.8 1.4 - 7.7 K/uL   Lymphs Abs 1.4 0.7 - 4.0 K/uL   Monocytes Absolute 0.5 0.1 - 1.0 K/uL   Eosinophils Absolute 0.2 0.0 - 0.7 K/uL   Basophils Absolute 0.1 0.0 - 0.1 K/uL  Comprehensive metabolic panel  Result Value Ref Range   Sodium 142 135 - 145 mEq/L   Potassium 4.0 3.5 - 5.1 mEq/L   Chloride 106 96 - 112 mEq/L   CO2 25 19 - 32 mEq/L   Glucose, Bld 91 70 - 99 mg/dL   BUN 15 6 - 23 mg/dL   Creatinine, Ser 0.70 0.40 - 1.20 mg/dL   Total Bilirubin 0.4 0.2 - 1.2 mg/dL   Alkaline Phosphatase 74 39 - 117 U/L   AST 11 0 - 37 U/L   ALT 13 0 - 35 U/L   Total Protein 6.5 6.0 - 8.3 g/dL   Albumin 4.1 3.5 - 5.2 g/dL   Calcium 8.9 8.4 - 10.5 mg/dL   GFR 85.25 >60.00 mL/min  Lipid panel  Result Value Ref Range   Cholesterol 210 (H) 0 - 200 mg/dL   Triglycerides 93.0 0.0 - 149.0 mg/dL   HDL 59.50 >39.00 mg/dL   VLDL 18.6 0.0 - 40.0 mg/dL   LDL Cholesterol 132 (H) 0 - 99 mg/dL   Total CHOL/HDL Ratio 4     NonHDL 150.45   TSH  Result Value Ref Range   TSH 2.29 0.35 - 4.50 uIU/mL  VITAMIN D 25 Hydroxy (Vit-D Deficiency, Fractures)  Result Value Ref Range   VITD 31.12 30.00 - 100.00 ng/mL  Hemoglobin A1c  Result Value Ref Range   Hgb A1c MFr Bld 5.8 4.6 - 6.5 %   Assessment and Plan   Tricia [name] was seen today for follow-up.  Diagnoses and all orders for this visit:  Insulin resistance Comments: Reviewed healthy food choices.  Pure hypercholesterolemia Comments: Reviewed exercise.  Morbid obesity (H[facility]) -     phentermine (ADIPEX-P) 37.5 MG tablet; Take 1 tablet (37.5 mg total) by mouth daily before breakfast.  Primary insomnia  Morbid obesity ([place]) Comments: Doing well. Compliant with med. Eating healthy foods. Exercising yet. Will continue with current treatment. Down 40 pounds since beginning treatment.  Orders: -     phentermine (ADIPEX-P) 37.5 MG tablet; Take 1 tablet (37.5 mg total) by mouth daily before breakfast.   . Orders and follow up as documented in [facility], reviewed diet, exercise and weight control, cardiovascular risk and specific lipid/LDL goals reviewed, reviewed medications and side effects in detail.  . Reviewed expectations re: course of current medical issues. . Outlined signs and symptoms indicating need for more acute intervention. . Patient verbalized understanding and all questions were answered. . Patient received an After Visit Summary.  Briscoe Deutscher, DO [place], [facility] 12/15/2018

## 2018-12-11 ENCOUNTER — Other Ambulatory Visit: Payer: Self-pay

## 2018-12-11 ENCOUNTER — Encounter: Payer: Self-pay | Admitting: Family Medicine

## 2018-12-11 ENCOUNTER — Ambulatory Visit (INDEPENDENT_AMBULATORY_CARE_PROVIDER_SITE_OTHER): Payer: PRIVATE HEALTH INSURANCE | Admitting: Family Medicine

## 2018-12-11 VITALS — BP 102/80 | HR 85 | Temp 98.1°F | Ht 65.0 in | Wt 232.0 lb

## 2018-12-11 DIAGNOSIS — E8881 Metabolic syndrome: Secondary | ICD-10-CM | POA: Diagnosis not present

## 2018-12-11 DIAGNOSIS — F5101 Primary insomnia: Secondary | ICD-10-CM | POA: Diagnosis not present

## 2018-12-11 DIAGNOSIS — E88819 Insulin resistance, unspecified: Secondary | ICD-10-CM

## 2018-12-11 DIAGNOSIS — E7800 Pure hypercholesterolemia, unspecified: Secondary | ICD-10-CM

## 2018-12-11 MED ORDER — PHENTERMINE HCL 37.5 MG PO TABS: 37.5000 mg | ORAL_TABLET | Freq: Every day | ORAL | 0 refills | Status: DC

## 2018-12-11 NOTE — Patient Instructions (Addendum)
[email or web address] FOR FOOD CHOICES. [employer] TRAIN[employer] OR [employer] FOR EXERCISE APPS.

## 2019-01-09 ENCOUNTER — Telehealth: Payer: Self-pay

## 2019-01-09 NOTE — Telephone Encounter (Signed)
Copied from [place] [id number]. Topic: General - Other >> Jan 09, 2019  7:43 AM Keene Breath wrote: Reason for CRM: Patient called to ask the doctor to call her regarding her weight loss and she said she needs help.  Please call patient to discuss.  If patient does not answer, please leave a message.  CB# [phone]

## 2019-01-09 NOTE — Telephone Encounter (Signed)
Left a message for pt to call office back.  °

## 2019-01-20 NOTE — Telephone Encounter (Signed)
Left message to return call to our office.  

## 2019-01-21 NOTE — Telephone Encounter (Addendum)
Pt returning your call concerning message below. Pt states everything is OK and she is back on track. What she is taking is working. Nothing further needed.

## 2019-01-22 ENCOUNTER — Other Ambulatory Visit: Payer: Self-pay | Admitting: Family Medicine

## 2019-01-22 DIAGNOSIS — F5101 Primary insomnia: Secondary | ICD-10-CM

## 2019-01-22 NOTE — Telephone Encounter (Signed)
Last fill 10/25/18  #90/0 Last ov 12/11/18

## 2019-02-08 IMAGING — NM NM MISC PROCEDURE
3 series · 18 of 18 positions shown · non-contrast
Comparison: none

[Series 1: stress-gsp_(id)_sa · 6.4mm · 6.40mm/px · 6 of 512 frames shown]
[frame 43/512]
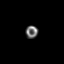
[frame 128/512]
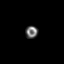
[frame 214/512]
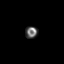
[frame 299/512]
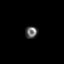
[frame 384/512]
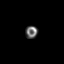
[frame 470/512]
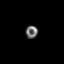

[Series 1: stress-sum-em_(id)_sa · 6.4mm · 6.40mm/px · 6 of 64 frames shown]
[frame 6/64]
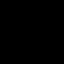
[frame 16/64]
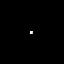
[frame 27/64]
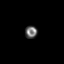
[frame 38/64]
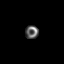
[frame 48/64]
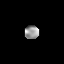
[frame 59/64]
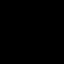

[Series 1: rest_(id)_sa · 6.4mm · 6.40mm/px · 6 of 64 frames shown]
[frame 6/64]
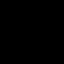
[frame 16/64]
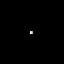
[frame 27/64]
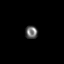
[frame 38/64]
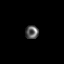
[frame 48/64]
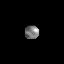
[frame 59/64]
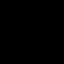

[18 of 18 positions shown; findings below may reference images not displayed]

Canned report from images found in remote index.

Refer to host system for actual result text.

## 2019-03-12 NOTE — Progress Notes (Signed)
SUHEYLA GRUMBLING is a 58 y.o. female is here for follow up.  History of Present Illness:   HPI:   Primary insomnia Taking Trazodone4/7nights per week. Helps with sleep.  Morbid obesity ([place]) Working 8-5, through lunch. Not focusing on planning meals. Not exercising. Uses Phentermine every other day. Up 3 pounds.   Vitamin D deficiency Patient has been taking Vitamin D 1000 units daily  B12 deficiency Patient has been taking Super B complex daily.  Health Maintenance Due  Topic Date Due  . INFLUENZA VACCINE  12/21/2018   Depression screen [facility] 2/9 11/14/2018 04/29/2018 09/05/2017  Decreased Interest 0 0 0  Down, Depressed, Hopeless 0 0 0  PHQ - 2 Score 0 0 0  Altered sleeping - 0 -  Tired, decreased energy - 0 -  Change in appetite - 0 -  Feeling bad or failure about yourself  - 0 -  Trouble concentrating - 0 -  Moving slowly or fidgety/restless - 0 -  Suicidal thoughts - 0 -  PHQ-9 Score - 0 -  Difficult doing work/chores - Not difficult at all -   PMHx, SurgHx, SocialHx, FamHx, Medications, and Allergies were reviewed in the Visit Navigator and updated as appropriate.   Patient Active Problem List   Diagnosis Date Noted  . Insulin resistance 07/30/2018  . Insomnia 07/30/2018  . Morbid obesity ([place]) 04/29/2018  . B12 deficiency 04/29/2018  . Retraction of nipple, with normal mammogram and [place], followed by GYN 02/04/2018  . Gingivitis, followed by Dentist 08/04/2016  . Bilateral leg edema 08/04/2016  . Hx of tubal ligation 08/04/2016  . Hx of appendectomy 08/04/2016  . Hx of cholecystectomy 08/04/2016  . Ganglion cyst of volar aspect of right wrist 08/02/2016  . Seizure disorder ([place]) 12/06/2013  . Esophageal reflux 03/19/2012  . 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 Family history of malignant neoplasm of gastrointestinal tract 04/11/2011  . Hyperlipidemia 02/10/2011  . MVP (mitral valve prolapse)   . Meningioma ([place]), followed by  Dr. Sherwood Gambler given phenobarbital for post surgery seizures, controlled   . Migraine headache, with aura, related to menses, Imitrex abortive   . Vitamin D deficiency, taking daily OTC vitamin D 12/24/2007  . History of colonic polyps, with polypectomy 2006, 2011 02/27/2007   Social History   Tobacco Use  . Smoking status: Former Smoker    Packs/day: 1.50    Types: Cigarettes    Quit date: 05/22/1990    Years since quitting: 28.8  . Smokeless tobacco: Never Used  Substance Use Topics  . Alcohol use: Yes    Comment: occas  . Drug use: No   Current Medications and Allergies   Current Outpatient Medications:  .  fluocinonide cream (LIDEX) 0.05 %, APPLY TO AFFECTED AREA TWICE A DAY, Disp: , Rfl:  .  hydrochlorothiazide (MICROZIDE) 12.5 MG capsule, TAKE 1 CAPSULE BY MOUTH EVERY DAY, Disp: 90 capsule, Rfl: 1 .  PHENobarbital (LUMINAL) 97.2 MG tablet, Take 97.2 mg by mouth at bedtime. , Disp: , Rfl:  .  phentermine (ADIPEX-P) 37.5 MG tablet, Take 1 tablet (37.5 mg total) by mouth daily before breakfast., Disp: 90 tablet, Rfl: 1 .  SUMAtriptan (IMITREX) 100 MG tablet, Take 1 tablet (100 mg total) by mouth once for 1 dose. May repeat in 2 hours if headache persists or recurs., Disp: 10 tablet, Rfl: 0 .  traZODone (DESYREL) 50 MG tablet, Take 0.5-1 tablets (25-50 mg total) by mouth at bedtime as needed for sleep., Disp:  90 tablet, Rfl: 1   Allergies  Allergen Reactions  . Codeine Sulfate     REACTION: unspecified  . Phenytoin Sodium Extended Rash   Review of Systems   Pertinent items are noted in the HPI. Otherwise, a complete ROS is negative.  Vitals   Vitals:   03/14/19 0748  BP: 124/84  Pulse: 80  Temp: 98.3 F (36.8 C)  TempSrc: Temporal  SpO2: 97%  Weight: 235 lb 6.4 oz (106.8 kg)  Height: 5\' 5"  (1.651 m)     Body mass index is 39.17 kg/m.  Physical Exam   Physical Exam Vitals signs and nursing note reviewed.  HENT:     Head: Normocephalic and atraumatic.  Eyes:       Pupils: Pupils are equal, round, and reactive to light.  Neck:     Musculoskeletal: Normal range of motion and neck supple.  Cardiovascular:     Rate and Rhythm: Normal rate and regular rhythm.     Heart sounds: Normal heart sounds.  Pulmonary:     Effort: Pulmonary effort is normal.  Abdominal:     Palpations: Abdomen is soft.  Skin:    General: Skin is warm.  Psychiatric:        Behavior: Behavior normal.    Results for orders placed or performed in visit on 11/14/18  CBC with Differential/Platelet  Result Value Ref Range   WBC 6.0 4.0 - 10.5 K/uL   RBC 4.67 3.87 - 5.11 Mil/uL   Hemoglobin 13.6 12.0 - 15.0 g/dL   HCT 40.5 36.0 - 46.0 %   MCV 86.6 78.0 - 100.0 fl   MCHC 33.5 30.0 - 36.0 g/dL   RDW 12.2 11.5 - 15.5 %   Platelets 251.0 150.0 - 400.0 K/uL   Neutrophils Relative % 63.4 43.0 - 77.0 %   Lymphocytes Relative 24.1 12.0 - 46.0 %   Monocytes Relative 7.8 3.0 - 12.0 %   Eosinophils Relative 3.1 0.0 - 5.0 %   Basophils Relative 1.6 0.0 - 3.0 %   Neutro Abs 3.8 1.4 - 7.7 K/uL   Lymphs Abs 1.4 0.7 - 4.0 K/uL   Monocytes Absolute 0.5 0.1 - 1.0 K/uL   Eosinophils Absolute 0.2 0.0 - 0.7 K/uL   Basophils Absolute 0.1 0.0 - 0.1 K/uL  Comprehensive metabolic panel  Result Value Ref Range   Sodium 142 135 - 145 mEq/L   Potassium 4.0 3.5 - 5.1 mEq/L   Chloride 106 96 - 112 mEq/L   CO2 25 19 - 32 mEq/L   Glucose, Bld 91 70 - 99 mg/dL   BUN 15 6 - 23 mg/dL   Creatinine, Ser 0.70 0.40 - 1.20 mg/dL   Total Bilirubin 0.4 0.2 - 1.2 mg/dL   Alkaline Phosphatase 74 39 - 117 U/L   AST 11 0 - 37 U/L   ALT 13 0 - 35 U/L   Total Protein 6.5 6.0 - 8.3 g/dL   Albumin 4.1 3.5 - 5.2 g/dL   Calcium 8.9 8.4 - 10.5 mg/dL   GFR 85.25 >60.00 mL/min  Lipid panel  Result Value Ref Range   Cholesterol 210 (H) 0 - 200 mg/dL   Triglycerides 93.0 0.0 - 149.0 mg/dL   HDL 59.50 >39.00 mg/dL   VLDL 18.6 0.0 - 40.0 mg/dL   LDL Cholesterol 132 (H) 0 - 99 mg/dL   Total CHOL/HDL Ratio 4     NonHDL 150.45   TSH  Result Value Ref Range   TSH 2.29 0.35 -  4.50 uIU/mL  VITAMIN D 25 Hydroxy (Vit-D Deficiency, Fractures)  Result Value Ref Range   VITD 31.12 30.00 - 100.00 ng/mL  Hemoglobin A1c  Result Value Ref Range   Hgb A1c MFr Bld 5.8 4.6 - 6.5 %   Assessment and Plan   Cristabel was seen today for follow-up.  Diagnoses and all orders for this visit:  Insulin resistance  Vitamin D deficiency, taking daily OTC vitamin D  Pure hypercholesterolemia  Morbid obesity (HCC) -     phentermine (ADIPEX-P) 37.5 MG tablet; Take 1 tablet (37.5 mg total) by mouth daily before breakfast.  B12 deficiency  Lower extremity edema -     hydrochlorothiazide (MICROZIDE) 12.5 MG capsule; TAKE 1 CAPSULE BY MOUTH EVERY DAY  Morbid obesity (HCC) Comments: Down 40 pounds since beginning treatment. Orders: -     phentermine (ADIPEX-P) 37.5 MG tablet; Take 1 tablet (37.5 mg total) by mouth daily before breakfast.  Primary insomnia -     traZODone (DESYREL) 50 MG tablet; Take 0.5-1 tablets (25-50 mg total) by mouth at bedtime as needed for sleep.  Need for immunization against influenza -     Flu Vaccine QUAD 36+ mos IM    . Orders and follow up as documented in [place], reviewed diet, exercise and weight control, cardiovascular risk and specific lipid/LDL goals reviewed, reviewed medications and side effects in detail.  . Reviewed expectations re: course of current medical issues. . Outlined signs and symptoms indicating need for more acute intervention. . Patient verbalized understanding and all questions were answered. . Patient received an After Visit Summary.  Briscoe Deutscher, DO [place], [facility] 03/14/2019

## 2019-03-14 ENCOUNTER — Other Ambulatory Visit: Payer: Self-pay

## 2019-03-14 ENCOUNTER — Ambulatory Visit (INDEPENDENT_AMBULATORY_CARE_PROVIDER_SITE_OTHER): Payer: PRIVATE HEALTH INSURANCE | Admitting: Family Medicine

## 2019-03-14 ENCOUNTER — Encounter: Payer: Self-pay | Admitting: Family Medicine

## 2019-03-14 VITALS — BP 124/84 | HR 80 | Temp 98.3°F | Ht 65.0 in | Wt 235.4 lb

## 2019-03-14 DIAGNOSIS — E559 Vitamin D deficiency, unspecified: Secondary | ICD-10-CM

## 2019-03-14 DIAGNOSIS — E538 Deficiency of other specified B group vitamins: Secondary | ICD-10-CM

## 2019-03-14 DIAGNOSIS — F5101 Primary insomnia: Secondary | ICD-10-CM

## 2019-03-14 DIAGNOSIS — Z23 Encounter for immunization: Secondary | ICD-10-CM

## 2019-03-14 DIAGNOSIS — R600 Localized edema: Secondary | ICD-10-CM

## 2019-03-14 DIAGNOSIS — E7800 Pure hypercholesterolemia, unspecified: Secondary | ICD-10-CM

## 2019-03-14 DIAGNOSIS — E88819 Insulin resistance, unspecified: Secondary | ICD-10-CM

## 2019-03-14 DIAGNOSIS — E8881 Metabolic syndrome: Secondary | ICD-10-CM

## 2019-03-14 MED ORDER — PHENTERMINE HCL 37.5 MG PO TABS: 37.5000 mg | ORAL_TABLET | Freq: Every day | ORAL | 1 refills | Status: DC

## 2019-03-14 MED ORDER — HYDROCHLOROTHIAZIDE 12.5 MG PO CAPS: ORAL_CAPSULE | ORAL | 1 refills | Status: DC

## 2019-03-14 MED ORDER — TRAZODONE HCL 50 MG PO TABS: 25.0000 mg | ORAL_TABLET | Freq: Every evening | ORAL | 1 refills | Status: DC | PRN

## 2019-03-28 ENCOUNTER — Other Ambulatory Visit: Payer: Self-pay

## 2019-03-28 ENCOUNTER — Encounter: Payer: Self-pay | Admitting: Physician Assistant

## 2019-03-28 ENCOUNTER — Telehealth: Payer: Self-pay | Admitting: Family Medicine

## 2019-03-28 ENCOUNTER — Ambulatory Visit (INDEPENDENT_AMBULATORY_CARE_PROVIDER_SITE_OTHER): Payer: PRIVATE HEALTH INSURANCE | Admitting: Physician Assistant

## 2019-03-28 VITALS — Temp 98.1°F

## 2019-03-28 DIAGNOSIS — Z7189 Other specified counseling: Secondary | ICD-10-CM | POA: Diagnosis not present

## 2019-03-28 DIAGNOSIS — R05 Cough: Secondary | ICD-10-CM

## 2019-03-28 DIAGNOSIS — Z20822 Contact with and (suspected) exposure to covid-19: Secondary | ICD-10-CM

## 2019-03-28 DIAGNOSIS — R059 Cough, unspecified: Secondary | ICD-10-CM

## 2019-03-28 MED ORDER — AZITHROMYCIN 250 MG PO TABS: ORAL_TABLET | ORAL | 0 refills | Status: DC

## 2019-03-28 NOTE — Progress Notes (Deleted)
Virtual Visit via Video   I connected with Tricia [name] on 03/28/19 at 11:20 AM EST by a video enabled telemedicine application and verified that I am speaking with the correct person using two identifiers. Location patient: Home Location provider: [facility], Office Persons participating in the virtual visit: Leylani M Farino, Samantha Worley PA-C, Anselmo Pickler, LPN   I discussed the limitations of evaluation and management by telemedicine and the availability of in person appointments. The patient expressed understanding and agreed to proceed.  I acted as a Education administrator for [employer], PA-C [employer], LPN  Subjective:   HPI:  Sinus problem Pt c/o sinus issue x 2 weeks, has a deep cough and pressure in head. Denies fever  ROS: See pertinent positives and negatives per HPI.  Patient Active Problem List   Diagnosis Date Noted  . Insulin resistance 07/30/2018  . Insomnia 07/30/2018  . Morbid obesity ([place]) 04/29/2018  . B12 deficiency 04/29/2018  . Retraction of nipple, with normal mammogram and [place], followed by GYN 02/04/2018  . Gingivitis, followed by Dentist 08/04/2016  . Bilateral leg edema 08/04/2016  . Hx of tubal ligation 08/04/2016  . Hx of appendectomy 08/04/2016  . Hx of cholecystectomy 08/04/2016  . Ganglion cyst of volar aspect of right wrist 08/02/2016  . Seizure disorder ([place]) 12/06/2013  . Esophageal reflux 03/19/2012  . 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 Family history of malignant neoplasm of gastrointestinal tract 04/11/2011  . Hyperlipidemia 02/10/2011  . MVP (mitral valve prolapse)   . Meningioma ([place]), followed by Dr. Sherwood Gambler given phenobarbital for post surgery seizures, controlled   . Migraine headache, with aura, related to menses, Imitrex abortive   . Vitamin D deficiency, taking daily OTC vitamin D 12/24/2007  . History of colonic polyps, with polypectomy 2006, 2011 02/27/2007    Social History   Tobacco Use   . Smoking status: Former Smoker    Packs/day: 1.50    Types: Cigarettes    Quit date: 05/22/1990    Years since quitting: 28.8  . Smokeless tobacco: Never Used  Substance Use Topics  . Alcohol use: Yes    Comment: occas    Current Outpatient Medications:  .  fluocinonide cream (LIDEX) 0.05 %, APPLY TO AFFECTED AREA TWICE A DAY, Disp: , Rfl:  .  hydrochlorothiazide (MICROZIDE) 12.5 MG capsule, TAKE 1 CAPSULE BY MOUTH EVERY DAY, Disp: 90 capsule, Rfl: 1 .  PHENobarbital (LUMINAL) 97.2 MG tablet, Take 97.2 mg by mouth at bedtime. , Disp: , Rfl:  .  phentermine (ADIPEX-P) 37.5 MG tablet, Take 1 tablet (37.5 mg total) by mouth daily before breakfast., Disp: 90 tablet, Rfl: 1 .  SUMAtriptan (IMITREX) 100 MG tablet, Take 1 tablet (100 mg total) by mouth once for 1 dose. May repeat in 2 hours if headache persists or recurs., Disp: 10 tablet, Rfl: 0 .  traZODone (DESYREL) 50 MG tablet, Take 0.5-1 tablets (25-50 mg total) by mouth at bedtime as needed for sleep., Disp: 90 tablet, Rfl: 1  Allergies  Allergen Reactions  . Codeine Sulfate     REACTION: unspecified  . Phenytoin Sodium Extended Rash    Objective:   VITALS: Per patient if applicable, see vitals. GENERAL: Alert, appears well and in no acute distress. HEENT: Atraumatic, conjunctiva clear, no obvious abnormalities on inspection of external nose and ears. NECK: Normal movements of the head and neck. CARDIOPULMONARY: No increased WOB. Speaking in clear sentences. I:E ratio WNL.  MS: Moves all visible extremities  without noticeable abnormality. PSYCH: Pleasant and cooperative, well-groomed. Speech normal rate and rhythm. Affect is appropriate. Insight and judgement are appropriate. Attention is focused, linear, and appropriate.  NEURO: CN grossly intact. Oriented as arrived to appointment on time with no prompting. Moves both UE equally.  SKIN: No obvious lesions, wounds, erythema, or cyanosis noted on face or hands.  Assessment and  Plan:   There are no diagnoses linked to this encounter.  . Reviewed expectations re: course of current medical issues. . Discussed self-management of symptoms. . Outlined signs and symptoms indicating need for more acute intervention. . Patient verbalized understanding and all questions were answered. Marland Kitchen Health Maintenance issues including appropriate healthy diet, exercise, and smoking avoidance were discussed with patient. . See orders for this visit as documented in the electronic medical record.  I discussed the assessment and treatment plan with the patient. The patient was provided an opportunity to ask questions and all were answered. The patient agreed with the plan and demonstrated an understanding of the instructions.   The patient was advised to call back or seek an in-person evaluation if the symptoms worsen or if the condition fails to improve as anticipated.   ***  Anselmo Pickler, LPN [id number]

## 2019-03-28 NOTE — Progress Notes (Signed)
Virtual Visit via Video   I connected with Tricia [name] on 03/28/19 at 11:20 AM EST by a video enabled telemedicine application and verified that I am speaking with the correct person using two identifiers. Location patient: Home Location provider: [facility], Office Persons participating in the virtual visit: Tricia [name], Tricia Hatfield PA-C, Tricia Pickler, LPN   I discussed the limitations of evaluation and management by telemedicine and the availability of in person appointments. The patient expressed understanding and agreed to proceed.  I acted as a Education administrator for [employer], PA-C [employer], LPN  Subjective:   HPI:   Patient is requesting evaluation for possible COVID-19.  Symptom onset: 2 weeks ago  Travel or Contacts: No  Patient endorses the following symptoms: Fever >100.31F []   Yes [x]   No []   Unknown Subjective fever (felt feverish) []   Yes [x]   No []   Unknown Chills []   Yes [x]   No []   Unknown Muscle aches (myalgia) []   Yes [x]   No []   Unknown Runny nose (rhinorrhea) []   Yes [x]   No []   Unknown Sore throat [x]   Yes []   No []   Unknown Cough (new onset or worsening of chronic cough) [x]   Yes []   No []   Unknown Shortness of breath (dyspnea) []   Yes [x]   No []   Unknown Nausea or vomiting []   Yes [x]   No []   Unknown Headache []   Yes [x]   No []   Unknown Abdominal pain  []   Yes [x]   No []   Unknown Diarrhea (?3 loose/looser than normal stools/24hr period) []   Yes [x]   No []   Unknown  Other, specify: Pt was sent home last week 10/27 from work and has not been back since. Pt c/o pressure in head, non-productive cough.  She has a history of sinus infections, and states that this is her typical course with this.  Treatments tried: OTC Mucinex DM started yesterday.  She recently had diarrhea, but thinks it might be because of this.  Patient risk factors: Smoker? []   Current [x]   Former []   Never If female, currently pregnant? []   Yes [x]   No  ROS: See  pertinent positives and negatives per HPI.  Patient Active Problem List   Diagnosis Date Noted  . Insulin resistance 07/30/2018  . Insomnia 07/30/2018  . Morbid obesity ([place]) 04/29/2018  . B12 deficiency 04/29/2018  . Retraction of nipple, with normal mammogram and [place], followed by GYN 02/04/2018  . Gingivitis, followed by Dentist 08/04/2016  . Bilateral leg edema 08/04/2016  . Hx of tubal ligation 08/04/2016  . Hx of appendectomy 08/04/2016  . Hx of cholecystectomy 08/04/2016  . Ganglion cyst of volar aspect of right wrist 08/02/2016  . Seizure disorder ([place]) 12/06/2013  . Esophageal reflux 03/19/2012  . 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 Family history of malignant neoplasm of gastrointestinal tract 04/11/2011  . Hyperlipidemia 02/10/2011  . MVP (mitral valve prolapse)   . Meningioma ([place]), followed by Dr. Sherwood Gambler given phenobarbital for post surgery seizures, controlled   . Migraine headache, with aura, related to menses, Imitrex abortive   . Vitamin D deficiency, taking daily OTC vitamin D 12/24/2007  . History of colonic polyps, with polypectomy 2006, 2011 02/27/2007    Social History   Tobacco Use  . Smoking status: Former Smoker    Packs/day: 1.50    Types: Cigarettes    Quit date: 05/22/1990    Years since quitting: 28.8  . Smokeless tobacco: Never  Used  Substance Use Topics  . Alcohol use: Yes    Comment: occas    Current Outpatient Medications:  .  fluocinonide cream (LIDEX) 0.05 %, APPLY TO AFFECTED AREA TWICE A DAY, Disp: , Rfl:  .  hydrochlorothiazide (MICROZIDE) 12.5 MG capsule, TAKE 1 CAPSULE BY MOUTH EVERY DAY, Disp: 90 capsule, Rfl: 1 .  PHENobarbital (LUMINAL) 97.2 MG tablet, Take 97.2 mg by mouth at bedtime. , Disp: , Rfl:  .  phentermine (ADIPEX-P) 37.5 MG tablet, Take 1 tablet (37.5 mg total) by mouth daily before breakfast., Disp: 90 tablet, Rfl: 1 .  traZODone (DESYREL) 50 MG tablet, Take 0.5-1 tablets (25-50 mg total) by mouth  at bedtime as needed for sleep., Disp: 90 tablet, Rfl: 1 .  azithromycin (ZITHROMAX) 250 MG tablet, Take two tabs on day 1, then one tab daily x 4 days, Disp: 6 tablet, Rfl: 0 .  SUMAtriptan (IMITREX) 100 MG tablet, Take 1 tablet (100 mg total) by mouth once for 1 dose. May repeat in 2 hours if headache persists or recurs., Disp: 10 tablet, Rfl: 0  Allergies  Allergen Reactions  . Codeine Sulfate     REACTION: unspecified  . Phenytoin Sodium Extended Rash    Objective:   VITALS: Per patient if applicable, see vitals. GENERAL: Alert, appears well and in no acute distress. HEENT: Atraumatic, conjunctiva clear, no obvious abnormalities on inspection of external nose and ears. NECK: Normal movements of the head and neck. CARDIOPULMONARY: No increased WOB. Speaking in clear sentences. I:E ratio WNL.  MS: Moves all visible extremities without noticeable abnormality. PSYCH: Pleasant and cooperative, well-groomed. Speech normal rate and rhythm. Affect is appropriate. Insight and judgement are appropriate. Attention is focused, linear, and appropriate.  NEURO: CN grossly intact. Oriented as arrived to appointment on time with no prompting. Moves both UE equally.  SKIN: No obvious lesions, wounds, erythema, or cyanosis noted on face or hands.  Assessment and Plan:   Tricia [name] was seen today for covid symptoms.  Diagnoses and all orders for this visit:  Cough -     Cancel: Novel Coronavirus, NAA ([facility])  Advice given about COVID-19 virus infection  Other orders -     azithromycin (ZITHROMAX) 250 MG tablet; Take two tabs on day 1, then one tab daily x 4 days    Patient has a respiratory illness with out signs of acute distress or respiratory compromise at this time.   I do suspect that she may have some sinusitis and potentially bronchitis given the duration and nature of her symptoms.  We are going to start Z-Pak.  I recommend that she continue her Mucinex DM for cough.  We are going  to send patient for drive-up testing. As a precaution, they have been advised to remain home until COVID-19 results and then possible further quarantine after that based on results and symptoms. Advised if they experience a "second sickening" or worsening symptoms as the illness progresses, they are to call the office for further instructions or seek emergent evaluation for any severe symptoms.   . Reviewed expectations re: course of current medical issues. . Discussed self-management of symptoms. . Outlined signs and symptoms indicating need for more acute intervention. . Patient verbalized understanding and all questions were answered. Marland Kitchen Health Maintenance issues including appropriate healthy diet, exercise, and smoking avoidance were discussed with patient. . See orders for this visit as documented in the electronic medical record.  I discussed the assessment and treatment plan with the patient. The patient  was provided an opportunity to ask questions and all were answered. The patient agreed with the plan and demonstrated an understanding of the instructions.   The patient was advised to call back or seek an in-person evaluation if the symptoms worsen or if the condition fails to improve as anticipated.   CMA or LPN served as scribe during this visit. History, Physical, and Plan performed by medical provider. The above documentation has been reviewed and is accurate and complete.   [place], [place] 03/28/2019

## 2019-03-28 NOTE — Telephone Encounter (Signed)
Please call to set up virtual visit.

## 2019-03-29 LAB — NOVEL CORONAVIRUS, NAA: SARS-CoV-2, NAA: NOT DETECTED

## 2019-05-05 ENCOUNTER — Other Ambulatory Visit: Payer: Self-pay

## 2019-05-05 DIAGNOSIS — Z20822 Contact with and (suspected) exposure to covid-19: Secondary | ICD-10-CM

## 2019-05-06 ENCOUNTER — Telehealth: Payer: Self-pay

## 2019-05-06 NOTE — Telephone Encounter (Signed)
Pt called about her test error; explained to pt that there was label error at test site; she verbalized understanding, and will retest.

## 2019-05-06 NOTE — Telephone Encounter (Signed)
Ivin Booty from [employer] that patient will need to recollect as there was no label on the sample. Ph: [id number], #1 and [id number].

## 2019-05-06 NOTE — Telephone Encounter (Signed)
Patient notified and will go retest

## 2019-05-07 LAB — NOVEL CORONAVIRUS, NAA

## 2019-05-08 ENCOUNTER — Ambulatory Visit: Payer: PRIVATE HEALTH INSURANCE | Attending: Internal Medicine

## 2019-05-08 DIAGNOSIS — Z20822 Contact with and (suspected) exposure to covid-19: Secondary | ICD-10-CM

## 2019-05-10 LAB — NOVEL CORONAVIRUS, NAA: SARS-CoV-2, NAA: NOT DETECTED

## 2019-05-20 ENCOUNTER — Other Ambulatory Visit: Payer: Self-pay

## 2019-05-20 ENCOUNTER — Ambulatory Visit: Payer: PRIVATE HEALTH INSURANCE

## 2019-05-21 ENCOUNTER — Ambulatory Visit (INDEPENDENT_AMBULATORY_CARE_PROVIDER_SITE_OTHER): Payer: PRIVATE HEALTH INSURANCE

## 2019-05-21 ENCOUNTER — Other Ambulatory Visit: Payer: Self-pay

## 2019-05-21 DIAGNOSIS — Z23 Encounter for immunization: Secondary | ICD-10-CM | POA: Diagnosis not present

## 2019-05-21 NOTE — Progress Notes (Signed)
Per orders of Dr. Jonni Sanger , injection of second shingrix given by Francella Solian in left deltoid. Patient tolerated injection well.

## 2019-06-13 ENCOUNTER — Ambulatory Visit: Payer: PRIVATE HEALTH INSURANCE | Admitting: Physician Assistant

## 2019-06-23 ENCOUNTER — Other Ambulatory Visit: Payer: Self-pay

## 2019-06-23 ENCOUNTER — Ambulatory Visit: Payer: 59 | Admitting: Physician Assistant

## 2019-06-23 ENCOUNTER — Encounter: Payer: Self-pay | Admitting: Physician Assistant

## 2019-06-23 MED ORDER — PHENTERMINE HCL 37.5 MG PO TABS: 37.5000 mg | ORAL_TABLET | Freq: Every day | ORAL | 0 refills | Status: DC

## 2019-06-23 NOTE — Patient Instructions (Signed)
It was great to see you!  Let's follow-up in 3 months to discuss weight loss and do a physical at that time.  Let's please work on finding time to exercise!  Take care,  Inda Coke PA-C

## 2019-06-23 NOTE — Progress Notes (Signed)
Tricia [name] is a 59 y.o. female is here to discuss: Weight loss  I acted as a Education administrator for [employer], PA-C [employer], LPN  History of Present Illness:   Chief Complaint  Patient presents with  . f/u on weight loss    HPI    Weight loss Pt following up today, currently on Phentermine 37.5 mg daily. Takes 5 days out of the week. When she takes it, it does help to curb her appetite. Since COVID-19, has been less active. She was started on phentermine in April 2019. Weight at that time was around 280 lb. Currently down to 237 lb. She is currently not exercising. Denies: chest pain, sob, issues with uncontrolled insomnia.  BP Readings from Last 3 Encounters:  06/23/19 124/86  03/14/19 124/84  12/11/18 102/80     There are no preventive care reminders to display for this patient.  Past Medical History:  Diagnosis Date  . Colon polyps 2006, 2011   TUBULAR ADENOMA AND HYPERPLASTIC POLYP  . Esophageal stricture 2012  . Glucosuria    with normal blood sugar  . Hepatitis C aby neg viral copies    serology positive but neg viral copies   . HLD (hyperlipidemia)   . Jaundice due to hepatitis    hx when younger unknown cause resolved   . Meningioma ([place])   . Migraine headache    some aura  period related  imitrex helps  . MVP (mitral valve prolapse)    echo 02 normal  . Seizures ([place])   . Ulcer 2012     Social History   Socioeconomic History  . Marital status: Legally Separated    Spouse name: Not on file  . Number of children: 3  . Years of education: Not on file  . Highest education level: Not on file  Occupational History  . Occupation: Product/process development scientist: [employer]  Tobacco Use  . Smoking status: Former Smoker    Packs/day: 1.50    Types: Cigarettes    Quit date: 05/22/1990    Years since quitting: 29.1  . Smokeless tobacco: Never Used  Substance and Sexual Activity  . Alcohol use: Yes    Comment: occas  . Drug use: No  . Sexual  activity: Not on file  Other Topics Concern  . Not on file  Social History Narrative   HH of 1 kid out to college    1 dog   Sleep 5-7 hours   Works Charity fundraiser co now Set designer    Not currnetly working to go back soon  A year    Social Determinants of Radio broadcast assistant Strain:   . Difficulty of Paying Living Expenses: Not on file  Food Insecurity:   . Worried About Charity fundraiser in the Last Year: Not on file  . Ran Out of Food in the Last Year: Not on file  Transportation Needs:   . Lack of Transportation (Medical): Not on file  . Lack of Transportation (Non-Medical): Not on file  Physical Activity:   . Days of Exercise per Week: Not on file  . Minutes of Exercise per Session: Not on file  Stress:   . Feeling of Stress : Not on file  Social Connections:   . Frequency of Communication with Friends and Family: Not on file  . Frequency of Social Gatherings with Friends and Family: Not on file  . Attends Religious Services: Not on file  .  Active Member of Clubs or Organizations: Not on file  . Attends Archivist Meetings: Not on file  . Marital Status: Not on file  Intimate Partner Violence:   . Fear of Current or Ex-Partner: Not on file  . Emotionally Abused: Not on file  . Physically Abused: Not on file  . Sexually Abused: Not on file    Past Surgical History:  Procedure Laterality Date  . APPENDECTOMY    . BUNIONECTOMY     laft  . CHOLECYSTECTOMY  2015   Dr Johney Maine  . CIN D&C conization  2000  . COLONOSCOPY W/ BIOPSIES  2011   SEVERAL   . CRANIOTOMY    . CYSTECTOMY  1982  . ESOPHAGOGASTRODUODENOSCOPY  2012  . frontal meningioma resected  1998  . left shoulder dislocation surgery  2005  . TUBAL LIGATION    . WRIST SURGERY  04/21/2017   rt-cyst removal     Family History  Problem Relation Age of Onset  . Colon cancer Father   . Hyperlipidemia Father   . Coronary artery disease Father 56       MI  . Diabetes  Father        elderly  . Colon cancer Paternal Aunt   . Colon cancer Other        both GMs   . Colon cancer Paternal Uncle   . Other Brother        transient global amnesia  . Crohn's disease Daughter   . Coronary artery disease Maternal Uncle 74  . Colon cancer Mother   . Breast cancer Neg Hx     PMHx, SurgHx, SocialHx, FamHx, Medications, and Allergies were reviewed in the Visit Navigator and updated as appropriate.   Patient Active Problem List   Diagnosis Date Noted  . Insulin resistance 07/30/2018  . Insomnia 07/30/2018  . Morbid obesity ([place]) 04/29/2018  . B12 deficiency 04/29/2018  . Retraction of nipple, with normal mammogram and [place], followed by GYN 02/04/2018  . Gingivitis, followed by Dentist 08/04/2016  . Bilateral leg edema 08/04/2016  . Hx of tubal ligation 08/04/2016  . Hx of appendectomy 08/04/2016  . Hx of cholecystectomy 08/04/2016  . Ganglion cyst of volar aspect of right wrist 08/02/2016  . Seizure disorder ([place]) 12/06/2013  . Esophageal reflux 03/19/2012  . 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 Family history of malignant neoplasm of gastrointestinal tract 04/11/2011  . Hyperlipidemia 02/10/2011  . MVP (mitral valve prolapse)   . Meningioma ([place]), followed by Dr. Sherwood Gambler given phenobarbital for post surgery seizures, controlled   . Migraine headache, with aura, related to menses, Imitrex abortive   . Vitamin D deficiency, taking daily OTC vitamin D 12/24/2007  . History of colonic polyps, with polypectomy 2006, 2011 02/27/2007    Social History   Tobacco Use  . Smoking status: Former Smoker    Packs/day: 1.50    Types: Cigarettes    Quit date: 05/22/1990    Years since quitting: 29.1  . Smokeless tobacco: Never Used  Substance Use Topics  . Alcohol use: Yes    Comment: occas  . Drug use: No    Current Medications and Allergies:    Current Outpatient Medications:  .  hydrochlorothiazide (MICROZIDE) 12.5 MG capsule, TAKE  1 CAPSULE BY MOUTH EVERY DAY, Disp: 90 capsule, Rfl: 1 .  PHENobarbital (LUMINAL) 97.2 MG tablet, Take 97.2 mg by mouth at bedtime. , Disp: , Rfl:  .  phentermine (ADIPEX-P) 37.5 MG tablet,  Take 1 tablet (37.5 mg total) by mouth daily before breakfast., Disp: 90 tablet, Rfl: 0 .  traZODone (DESYREL) 50 MG tablet, Take 0.5-1 tablets (25-50 mg total) by mouth at bedtime as needed for sleep., Disp: 90 tablet, Rfl: 1 .  Vitamin D, Cholecalciferol, 25 MCG (1000 UT) TABS, Take 1 tablet by mouth daily., Disp: , Rfl:  .  SUMAtriptan (IMITREX) 100 MG tablet, Take 1 tablet (100 mg total) by mouth once for 1 dose. May repeat in 2 hours if headache persists or recurs., Disp: 10 tablet, Rfl: 0   Allergies  Allergen Reactions  . Codeine Sulfate     REACTION: unspecified  . Phenytoin Sodium Extended Rash    Review of Systems   ROS  Negative unless otherwise specified per HPI.  Vitals:   Vitals:   06/23/19 1456  BP: 124/86  Pulse: 70  Temp: 97.9 F (36.6 C)  TempSrc: Temporal  SpO2: 96%  Weight: 237 lb 4 oz (107.6 kg)  Height: 5\' 5"  (1.651 m)     Body mass index is 39.48 kg/m.   Physical Exam:    Physical Exam Vitals and nursing note reviewed.  Constitutional:      General: She is not in acute distress.    Appearance: She is well-developed. She is not ill-appearing or toxic-appearing.  Cardiovascular:     Rate and Rhythm: Normal rate and regular rhythm.     Pulses: Normal pulses.     Heart sounds: Normal heart sounds, S1 normal and S2 normal.  Pulmonary:     Effort: Pulmonary effort is normal.     Breath sounds: Normal breath sounds.  Skin:    General: Skin is warm and dry.  Neurological:     Mental Status: She is alert.     GCS: GCS eye subscore is 4. GCS verbal subscore is 5. GCS motor subscore is 6.  Psychiatric:        Speech: Speech normal.        Behavior: Behavior normal. Behavior is cooperative.      Assessment and Plan:    Teionna was seen today for f/u on  weight loss.  Diagnoses and all orders for this visit:  Morbid obesity ([place]) Discussed risk of long-term use of phentermine. Discussed that this medication is not indicated for use if she is not achieving weight loss. While she has had positive response, I did discuss that we may continue for 3 more months but if she has plateau or no improvement in lifestyle changes, we will discontinue the medication. She is in agreement and verbalizes risks to medication. -     Basic metabolic panel -     phentermine (ADIPEX-P) 37.5 MG tablet; Take 1 tablet (37.5 mg total) by mouth daily before breakfast.    . Reviewed expectations re: course of current medical issues. . Discussed self-management of symptoms. . Outlined signs and symptoms indicating need for more acute intervention. . Patient verbalized understanding and all questions were answered. . See orders for this visit as documented in the electronic medical record. . Patient received an After Visit Summary.  CMA or LPN served as scribe during this visit. History, Physical, and Plan performed by medical provider. The above documentation has been reviewed and is accurate and complete.  Inda Coke, PA-C [place], [place] 06/23/2019  Follow-up: No follow-ups on file.

## 2019-06-24 LAB — BASIC METABOLIC PANEL
BUN: 19 mg/dL (ref 6–23)
CO2: 26 mEq/L (ref 19–32)
Calcium: 9.2 mg/dL (ref 8.4–10.5)
Chloride: 102 mEq/L (ref 96–112)
Creatinine, Ser: 0.81 mg/dL (ref 0.40–1.20)
GFR: 71.89 mL/min (ref >60.00)
Glucose, Bld: 78 mg/dL (ref 70–99)
Potassium: 4.3 mEq/L (ref 3.5–5.1)
Sodium: 137 mEq/L (ref 135–145)

## 2019-07-25 ENCOUNTER — Other Ambulatory Visit: Payer: Self-pay | Admitting: Obstetrics and Gynecology

## 2019-07-25 DIAGNOSIS — Z1231 Encounter for screening mammogram for malignant neoplasm of breast: Secondary | ICD-10-CM

## 2019-08-14 ENCOUNTER — Ambulatory Visit: Payer: 59 | Attending: Internal Medicine

## 2019-08-14 DIAGNOSIS — Z23 Encounter for immunization: Secondary | ICD-10-CM

## 2019-08-14 NOTE — Progress Notes (Signed)
   Covid-19 Vaccination Clinic  Name:  Tricia [name]    MRN: [id number] DOB: 06/06/1960  08/14/2019  [name] was observed post Covid-19 immunization for 15 minutes without incident. She was provided with Vaccine Information Sheet and instruction to access the V-Safe system.   [name] was instructed to call 911 with any severe reactions post vaccine: Marland Kitchen Difficulty breathing  . Swelling of face and throat  . A fast heartbeat  . A bad rash all over body  . Dizziness and weakness   Immunizations Administered    Name Date Dose VIS Date Route   Pfizer COVID-19 Vaccine 08/14/2019  3:09 PM 0.3 mL 05/02/2019 Intramuscular   Manufacturer: [place]   Lot: [id number]   [place]: [id number]

## 2019-09-08 ENCOUNTER — Ambulatory Visit: Payer: 59 | Attending: Internal Medicine

## 2019-09-08 DIAGNOSIS — Z23 Encounter for immunization: Secondary | ICD-10-CM

## 2019-09-08 NOTE — Progress Notes (Signed)
   Covid-19 Vaccination Clinic  Name:  Tricia [name]    MRN: [id number] DOB: 10-11-1960  09/08/2019  [name] was observed post Covid-19 immunization for 15 minutes without incident. She was provided with Vaccine Information Sheet and instruction to access the V-Safe system.   [name] was instructed to call 911 with any severe reactions post vaccine: Marland Kitchen Difficulty breathing  . Swelling of face and throat  . A fast heartbeat  . A bad rash all over body  . Dizziness and weakness   Immunizations Administered    Name Date Dose VIS Date Route   Pfizer COVID-19 Vaccine 09/08/2019  3:02 PM 0.3 mL 07/16/2018 Intramuscular   Manufacturer: [place]   Lot: [id number]   [place]: [id number]

## 2019-09-30 ENCOUNTER — Encounter: Payer: 59 | Admitting: Physician Assistant

## 2019-10-22 ENCOUNTER — Ambulatory Visit: Payer: 59

## 2019-10-23 ENCOUNTER — Ambulatory Visit: Payer: 59

## 2019-10-29 ENCOUNTER — Ambulatory Visit
Admission: RE | Admit: 2019-10-29 | Discharge: 2019-10-29 | Disposition: A | Discharge status: Home / Self Care | Payer: 59 | Source: Ambulatory Visit | Attending: Obstetrics and Gynecology | Admitting: Obstetrics and Gynecology

## 2019-10-29 ENCOUNTER — Other Ambulatory Visit: Payer: Self-pay

## 2019-10-29 DIAGNOSIS — Z1231 Encounter for screening mammogram for malignant neoplasm of breast: Secondary | ICD-10-CM

## 2019-11-18 ENCOUNTER — Other Ambulatory Visit: Payer: Self-pay

## 2019-11-18 ENCOUNTER — Encounter: Payer: Self-pay | Admitting: Physician Assistant

## 2019-11-18 ENCOUNTER — Ambulatory Visit (INDEPENDENT_AMBULATORY_CARE_PROVIDER_SITE_OTHER): Payer: 59 | Admitting: Physician Assistant

## 2019-11-18 VITALS — BP 110/72 | HR 73 | Temp 97.7°F | Ht 65.0 in | Wt 246.2 lb

## 2019-11-18 DIAGNOSIS — E7800 Pure hypercholesterolemia, unspecified: Secondary | ICD-10-CM | POA: Diagnosis not present

## 2019-11-18 DIAGNOSIS — Z0001 Encounter for general adult medical examination with abnormal findings: Secondary | ICD-10-CM | POA: Diagnosis not present

## 2019-11-18 DIAGNOSIS — E669 Obesity, unspecified: Secondary | ICD-10-CM

## 2019-11-18 DIAGNOSIS — Z1211 Encounter for screening for malignant neoplasm of colon: Secondary | ICD-10-CM

## 2019-11-18 DIAGNOSIS — J3489 Other specified disorders of nose and nasal sinuses: Secondary | ICD-10-CM

## 2019-11-18 DIAGNOSIS — E559 Vitamin D deficiency, unspecified: Secondary | ICD-10-CM | POA: Diagnosis not present

## 2019-11-18 DIAGNOSIS — R600 Localized edema: Secondary | ICD-10-CM | POA: Diagnosis not present

## 2019-11-18 LAB — COMPREHENSIVE METABOLIC PANEL
ALT: 14 U/L (ref 0–35)
AST: 13 U/L (ref 0–37)
Albumin: 4.2 g/dL (ref 3.5–5.2)
Alkaline Phosphatase: 90 U/L (ref 39–117)
BUN: 15 mg/dL (ref 6–23)
CO2: 29 mEq/L (ref 19–32)
Calcium: 9 mg/dL (ref 8.4–10.5)
Chloride: 103 mEq/L (ref 96–112)
Creatinine, Ser: 0.76 mg/dL (ref 0.40–1.20)
GFR: 77.27 mL/min (ref >60.00)
Glucose, Bld: 90 mg/dL (ref 70–99)
Potassium: 4.9 mEq/L (ref 3.5–5.1)
Sodium: 140 mEq/L (ref 135–145)
Total Bilirubin: 0.3 mg/dL (ref 0.2–1.2)
Total Protein: 6.7 g/dL (ref 6.0–8.3)

## 2019-11-18 LAB — CBC WITH DIFFERENTIAL/PLATELET
Basophils Absolute: 0.1 10*3/uL (ref 0.0–0.1)
Basophils Relative: 1.4 % (ref 0.0–3.0)
Eosinophils Absolute: 0.3 10*3/uL (ref 0.0–0.7)
Eosinophils Relative: 4 % (ref 0.0–5.0)
HCT: 41.5 % (ref 36.0–46.0)
Hemoglobin: 13.7 g/dL (ref 12.0–15.0)
Lymphocytes Relative: 20.2 % (ref 12.0–46.0)
Lymphs Abs: 1.3 10*3/uL (ref 0.7–4.0)
MCHC: 33 g/dL (ref 30.0–36.0)
MCV: 87.2 fl (ref 78.0–100.0)
Monocytes Absolute: 0.5 10*3/uL (ref 0.1–1.0)
Monocytes Relative: 8.3 % (ref 3.0–12.0)
Neutro Abs: 4.2 10*3/uL (ref 1.4–7.7)
Neutrophils Relative %: 66.1 % (ref 43.0–77.0)
Platelets: 267 10*3/uL (ref 150.0–400.0)
RBC: 4.76 Mil/uL (ref 3.87–5.11)
RDW: 13.1 % (ref 11.5–15.5)
WBC: 6.4 10*3/uL (ref 4.0–10.5)

## 2019-11-18 LAB — LIPID PANEL
Cholesterol: 217 mg/dL — ABNORMAL HIGH (ref 0–200)
HDL: 64.2 mg/dL (ref >39.00)
LDL Cholesterol: 135 mg/dL — ABNORMAL HIGH (ref 0–99)
NonHDL: 152.43
Total CHOL/HDL Ratio: 3
Triglycerides: 87 mg/dL (ref 0.0–149.0)
VLDL: 17.4 mg/dL (ref 0.0–40.0)

## 2019-11-18 LAB — HEMOGLOBIN A1C: Hgb A1c MFr Bld: 5.9 % (ref 4.6–6.5)

## 2019-11-18 LAB — VITAMIN D 25 HYDROXY (VIT D DEFICIENCY, FRACTURES): VITD: 27.67 ng/mL — ABNORMAL LOW (ref 30.00–100.00)

## 2019-11-18 MED ORDER — MUPIROCIN 2 % EX OINT: TOPICAL_OINTMENT | CUTANEOUS | 0 refills | Status: DC

## 2019-11-18 MED ORDER — HYDROCHLOROTHIAZIDE 12.5 MG PO CAPS: ORAL_CAPSULE | ORAL | 1 refills | Status: DC

## 2019-11-18 MED ORDER — PHENTERMINE HCL 37.5 MG PO TABS: 37.5000 mg | ORAL_TABLET | Freq: Every day | ORAL | 0 refills | Status: DC

## 2019-11-18 NOTE — Patient Instructions (Addendum)
It was great to see you!  Phentermine and HCTZ refills have been sent. Bactroban ointment for your nose has been sent in. Colonoscopy referral has been placed.  Medication that was recently approved for weight loss is Wegovy. I'd like to trial this with you once it is available to prescribe!   Please go to the lab for blood work.   Our office will call you with your results unless you have chosen to receive results via MyChart.  If your blood work is normal we will follow-up each year for physicals and as scheduled for chronic medical problems.  If anything is abnormal we will treat accordingly and get you in for a follow-up.  Take care,  [facility] Maintenance, Female Adopting a healthy lifestyle and getting preventive care are important in promoting health and wellness. Ask your health care provider about:  The right schedule for you to have regular tests and exams.  Things you can do on your own to prevent diseases and keep yourself healthy. What should I know about diet, weight, and exercise? Eat a healthy diet   Eat a diet that includes plenty of vegetables, fruits, low-fat dairy products, and lean protein.  Do not eat a lot of foods that are high in solid fats, added sugars, or sodium. Maintain a healthy weight Body mass index (BMI) is used to identify weight problems. It estimates body fat based on height and weight. Your health care provider can help determine your BMI and help you achieve or maintain a healthy weight. Get regular exercise Get regular exercise. This is one of the most important things you can do for your health. Most adults should:  Exercise for at least 150 minutes each week. The exercise should increase your heart rate and make you sweat (moderate-intensity exercise).  Do strengthening exercises at least twice a week. This is in addition to the moderate-intensity exercise.  Spend less time sitting. Even light physical activity can be  beneficial. Watch cholesterol and blood lipids Have your blood tested for lipids and cholesterol at 61 years of age, then have this test every 5 years. Have your cholesterol levels checked more often if:  Your lipid or cholesterol levels are high.  You are older than 61 years of age.  You are at high risk for heart disease. What should I know about cancer screening? Depending on your health history and family history, you may need to have cancer screening at various ages. This may include screening for:  Breast cancer.  Cervical cancer.  Colorectal cancer.  Skin cancer.  Lung cancer. What should I know about heart disease, diabetes, and high blood pressure? Blood pressure and heart disease  High blood pressure causes heart disease and increases the risk of stroke. This is more likely to develop in people who have high blood pressure readings, are of African descent, or are overweight.  Have your blood pressure checked: ? Every 3-5 years if you are 37-9 years of age. ? Every year if you are 25 years old or older. Diabetes Have regular diabetes screenings. This checks your fasting blood sugar level. Have the screening done:  Once every three years after age 55 if you are at a normal weight and have a low risk for diabetes.  More often and at a younger age if you are overweight or have a high risk for diabetes. What should I know about preventing infection? Hepatitis B If you have a higher risk for hepatitis B, you  should be screened for this virus. Talk with your health care provider to find out if you are at risk for hepatitis B infection. Hepatitis C Testing is recommended for:  Everyone born from [birth year] through 1967.  Anyone with known risk factors for hepatitis C. Sexually transmitted infections (STIs)  Get screened for STIs, including gonorrhea and chlamydia, if: ? You are sexually active and are younger than 61 years of age. ? You are older than 61 years of age and  your health care provider tells you that you are at risk for this type of infection. ? Your sexual activity has changed since you were last screened, and you are at increased risk for chlamydia or gonorrhea. Ask your health care provider if you are at risk.  Ask your health care provider about whether you are at high risk for HIV. Your health care provider may recommend a prescription medicine to help prevent HIV infection. If you choose to take medicine to prevent HIV, you should first get tested for HIV. You should then be tested every 3 months for as long as you are taking the medicine. Pregnancy  If you are about to stop having your period (premenopausal) and you may become pregnant, seek counseling before you get pregnant.  Take 400 to 800 micrograms (mcg) of folic acid every day if you become pregnant.  Ask for birth control (contraception) if you want to prevent pregnancy. Osteoporosis and menopause Osteoporosis is a disease in which the bones lose minerals and strength with aging. This can result in bone fractures. If you are 36 years old or older, or if you are at risk for osteoporosis and fractures, ask your health care provider if you should:  Be screened for bone loss.  Take a calcium or vitamin D supplement to lower your risk of fractures.  Be given hormone replacement therapy (HRT) to treat symptoms of menopause. Follow these instructions at home: Lifestyle  Do not use any products that contain nicotine or tobacco, such as cigarettes, e-cigarettes, and chewing tobacco. If you need help quitting, ask your health care provider.  Do not use street drugs.  Do not share needles.  Ask your health care provider for help if you need support or information about quitting drugs. Alcohol use  Do not drink alcohol if: ? Your health care provider tells you not to drink. ? You are pregnant, may be pregnant, or are planning to become pregnant.  If you drink alcohol: ? Limit how  much you use to 0-1 drink a day. ? Limit intake if you are breastfeeding.  Be aware of how much alcohol is in your drink. In the [place] one drink equals one 12 oz bottle of beer (355 mL), one 5 oz glass of wine (148 mL), or one 1 oz glass of hard liquor (44 mL). General instructions  Schedule regular health, dental, and eye exams.  Stay current with your vaccines.  Tell your health care provider if: ? You often feel depressed. ? You have ever been abused or do not feel safe at home. Summary  Adopting a healthy lifestyle and getting preventive care are important in promoting health and wellness.  Follow your health care provider's instructions about healthy diet, exercising, and getting tested or screened for diseases.  Follow your health care provider's instructions on monitoring your cholesterol and blood pressure. This information is not intended to replace advice given to you by your health care provider. Make sure you discuss any questions you have with  your health care provider. Document Revised: 05/01/2018 Document Reviewed: 05/01/2018 Elsevier Patient Education  2020 [employer].

## 2019-11-18 NOTE — Progress Notes (Signed)
I acted as a Education administrator for [employer], PA-C Anselmo Pickler, LPN    Subjective:    Tricia [name] is a 59 y.o. female and is here for a comprehensive physical exam.   HPI  There are no preventive care reminders to display for this patient.  Acute Concerns: Nasal irritation -- has been using neosporin, polysporin and nasal saline without significant relief of symptoms. Has irritation inside of her nares. When she blew her nose this morning she had slight blood.   Chronic Issues: Obesity -- has had about 9 lb weight gain since February. Has a stressful job and does admit to comfort eating. She is prescribed phentermine and takes every other day, will take 1/2 a tab in AM and 1/2 a tab in PM. She denies chest pain, palpitations. She is quite active at works -- gets about 10k steps. LE swelling --  Currently taking HCTZ 12.5 mg as needed. Doesn't like to take this daily because it makes her feel washed out. She is great about not adding salt to foods. Denies any asymmetrical swelling. HLD -- elevated cholesterol in the past, but denies any use of statin. Vit D def -- takes 1000 IU vit D daily  Health Maintenance: Immunizations -- UTD Colonoscopy -- due 03/2020 -- strong family history Mammogram -- UTD PAP -- UTD, due 10/2021 Bone Density -- None Diet -- avoids sugary beverages; eats all food groups Sleep habits -- takes trazodone to help calm her down if anxious at night, takes 50 mg nightly and feels like this works well for her Exercise -- chasing the dogs, walks a lot at work (gets 10k steps) Weight -- Weight: 246 lb 4 oz (111.7 kg)  Mood -- good Weight history: Wt Readings from Last 10 Encounters:  11/18/19 246 lb 4 oz (111.7 kg)  06/23/19 237 lb 4 oz (107.6 kg)  03/14/19 235 lb 6.4 oz (106.8 kg)  12/11/18 232 lb (105.2 kg)  11/14/18 228 lb 6.4 oz (103.6 kg)  07/30/18 229 lb (103.9 kg)  07/01/18 230 lb 12.8 oz (104.7 kg)  04/29/18 238 lb (108 kg)  04/05/18 244 lb  (110.7 kg)  03/28/18 244 lb 8 oz (110.9 kg)   Body mass index is 40.98 kg/m. Patient's last menstrual period was 12/23/2010. Alcohol use: rare Tobacco use: former  Depression screen [facility] 2/9 11/18/2019  Decreased Interest 0  Down, Depressed, Hopeless 0  PHQ - 2 Score 0  Altered sleeping -  Tired, decreased energy -  Change in appetite -  Feeling bad or failure about yourself  -  Trouble concentrating -  Moving slowly or fidgety/restless -  Suicidal thoughts -  PHQ-9 Score -  Difficult doing work/chores -     Other providers/specialists: Patient Care Team: Inda Coke, [place] as PCP - General (Physician Assistant) Jovita Gamma, MD (Neurosurgery) Inda Castle, MD (Inactive) (Gastroenterology) Newton Pigg, MD (Obstetrics and Gynecology) Lindwood Coke, MD as Attending Physician (Dermatology) Elesa Massed, NP as Nurse Practitioner (Obstetrics and Gynecology)    PMHx, SurgHx, SocialHx, Medications, and Allergies were reviewed in the Visit Navigator and updated as appropriate.   Past Medical History:  Diagnosis Date  . Colon polyps 2006, 2011   TUBULAR ADENOMA AND HYPERPLASTIC POLYP  . Esophageal stricture 2012  . Glucosuria    with normal blood sugar  . Hepatitis C aby neg viral copies    serology positive but neg viral copies   . HLD (hyperlipidemia)   . Jaundice due to hepatitis  hx when younger unknown cause resolved   . Meningioma ([place])   . Migraine headache    some aura  period related  imitrex helps  . MVP (mitral valve prolapse)    echo 02 normal  . Seizures ([place])   . Ulcer 2012     Past Surgical History:  Procedure Laterality Date  . APPENDECTOMY    . BUNIONECTOMY     laft  . CHOLECYSTECTOMY  2015   Dr Johney Maine  . CIN D&C conization  2000  . COLONOSCOPY W/ BIOPSIES  2011   SEVERAL   . CRANIOTOMY    . CYSTECTOMY  1982  . ESOPHAGOGASTRODUODENOSCOPY  2012  . frontal meningioma resected  1998  . left shoulder dislocation surgery   2005  . TUBAL LIGATION    . WRIST SURGERY  04/21/2017   rt-cyst removal      Family History  Problem Relation Age of Onset  . Colon cancer Father   . Hyperlipidemia Father   . Coronary artery disease Father 36       MI  . Diabetes Father        elderly  . Colon cancer Paternal Aunt   . Colon cancer Other        both GMs   . Colon cancer Paternal Uncle   . Other Brother        transient global amnesia  . Crohn's disease Daughter   . Coronary artery disease Maternal Uncle 74  . Colon cancer Mother   . Colon cancer Paternal Grandmother   . Deep vein thrombosis Paternal Grandfather   . Mitral valve prolapse Brother   . Breast cancer Neg Hx     Social History   Tobacco Use  . Smoking status: Former Smoker    Packs/day: 1.50    Types: Cigarettes    Quit date: 05/22/1990    Years since quitting: 29.5  . Smokeless tobacco: Never Used  Substance Use Topics  . Alcohol use: Yes    Comment: occasional  . Drug use: No    Review of Systems:   Review of Systems  Constitutional: Negative for chills, fever, malaise/fatigue and weight loss.  HENT: Negative for hearing loss, sinus pain and sore throat.   Respiratory: Negative for cough and hemoptysis.   Cardiovascular: Negative for chest pain, palpitations, leg swelling and PND.  Gastrointestinal: Negative for abdominal pain, constipation, diarrhea, heartburn, nausea and vomiting.  Genitourinary: Negative for dysuria, frequency and urgency.  Musculoskeletal: Negative for back pain, myalgias and neck pain.  Skin: Negative for itching and rash.  Neurological: Negative for dizziness, tingling, seizures and headaches.  Endo/Heme/Allergies: Negative for polydipsia.  Psychiatric/Behavioral: Negative for depression. The patient is not nervous/anxious.     Objective:   BP 110/72 (BP Location: Left Arm, Patient Position: Sitting, Cuff Size: Large)   Pulse 73   Temp 97.7 F (36.5 C) (Temporal)   Ht 5\' 5"  (1.651 m)   Wt 246 lb 4  oz (111.7 kg)   LMP 12/23/2010   SpO2 97%   BMI 40.98 kg/m  Body mass index is 40.98 kg/m.   General Appearance:    Alert, cooperative, no distress, appears stated age  Head:    Normocephalic, without obvious abnormality, atraumatic  Eyes:    PERRL, conjunctiva/corneas clear, EOM's intact, fundi    benign, both eyes  Ears:    Normal TM's and external ear canals, both ears  Nose:   Nares normal, septum midline, mucosa normal, no drainage  or sinus tenderness  Throat:   Lips, mucosa, and tongue normal; teeth and gums normal  Neck:   Supple, symmetrical, trachea midline, no adenopathy;    thyroid:  no enlargement/tenderness/nodules; no carotid   bruit or JVD  Back:     Symmetric, no curvature, ROM normal, no CVA tenderness  Lungs:     Clear to auscultation bilaterally, respirations unlabored  Chest Wall:    No tenderness or deformity   Heart:    Regular rate and rhythm, S1 and S2 normal, no murmur, rub or gallop  Breast Exam:    Deferred  Abdomen:     Soft, non-tender, bowel sounds active all four quadrants,    no masses, no organomegaly  Genitalia:    Deferred  Extremities:   Extremities normal, atraumatic, no cyanosis; trace b/l lower extremity edema  Pulses:   2+ and symmetric all extremities  Skin:   Skin color, texture, turgor normal, no rashes or lesions  Lymph nodes:   Cervical, supraclavicular, and axillary nodes normal  Neurologic:   CNII-XII intact, normal strength, sensation and reflexes    throughout     Assessment/Plan:   Morrison was seen today for annual exam.  Diagnoses and all orders for this visit:  Encounter for general adult medical examination with abnormal findings Today patient counseled on age appropriate routine health concerns for screening and prevention, each reviewed and up to date or declined. Immunizations reviewed and up to date or declined. Labs ordered and reviewed. Risk factors for depression reviewed and negative. Hearing function and  visual acuity are intact. ADLs screened and addressed as needed. Functional ability and level of safety reviewed and appropriate. Education, counseling and referrals performed based on assessed risks today. Patient provided with a copy of personalized plan for preventive services.  Nasal discomfort Will trial topical bactroban for irritation and dryness. Also consider use of humidifier.  Morbid obesity ([place]) Will continue phentermine at this time. She feels confident that the main stressor that is causing her to overeat will be out of her life soon. I have also discussed use of Wegovy -- provided her the name of this medication and advised that I am unable to prescribe it but reach out in a month or so and it  May be available. Orders: -     phentermine (ADIPEX-P) 37.5 MG tablet; Take 1 tablet (37.5 mg total) by mouth daily before breakfast. -     CBC with Differential/Platelet -     Comprehensive metabolic panel -     Hemoglobin A1c  Vitamin D deficiency Will check Vit D level and advise supplementation accordingly. -     VITAMIN D 25 Hydroxy (Vit-D Deficiency, Fractures)  Lower extremity edema Will refill HCTZ. Update labs today to assess renal function and lytes. Follow-up if symptoms do not improve or worsen. -     hydrochlorothiazide (MICROZIDE) 12.5 MG capsule; TAKE 1 CAPSULE BY MOUTH EVERY DAY  Pure hypercholesterolemia Update lipid panel today and recalculate ASCVD; advise accordingly. -     Lipid panel  Special screening for malignant neoplasms, colon -     Ambulatory referral to Gastroenterology  Other orders -     mupirocin ointment (BACTROBAN) 2 %; Apply to nose 1-2 times daily    Well Adult Exam: Labs ordered: Yes. Patient counseling was done. See below for items discussed. Discussed the patient's BMI.  The BMI is not in the acceptable range; BMI management plan is completed Follow up in 3 months. Breast cancer screening:  UTD. Cervical cancer screening: UTD   Patient  Counseling: [x]    Nutrition: Stressed importance of moderation in sodium/caffeine intake, saturated fat and cholesterol, caloric balance, sufficient intake of fresh fruits, vegetables, fiber, calcium, iron, and 1 mg of folate supplement per day (for females capable of pregnancy).  [x]    Stressed the importance of regular exercise.   [x]    Substance Abuse: Discussed cessation/primary prevention of tobacco, alcohol, or other drug use; driving or other dangerous activities under the influence; availability of treatment for abuse.   [x]    Injury prevention: Discussed safety belts, safety helmets, smoke detector, smoking near bedding or upholstery.   [x]    Sexuality: Discussed sexually transmitted diseases, partner selection, use of condoms, avoidance of unintended pregnancy  and contraceptive alternatives.  [x]    Dental health: Discussed importance of regular tooth brushing, flossing, and dental visits.  [x]    Health maintenance and immunizations reviewed. Please refer to Health maintenance section.   CMA or LPN served as scribe during this visit. History, Physical, and Plan performed by medical provider. The above documentation has been reviewed and is accurate and complete.   Inda Coke, PA-C [place]

## 2020-02-12 ENCOUNTER — Other Ambulatory Visit: Payer: Self-pay

## 2020-02-12 ENCOUNTER — Encounter: Payer: Self-pay | Admitting: Physician Assistant

## 2020-02-12 ENCOUNTER — Ambulatory Visit (INDEPENDENT_AMBULATORY_CARE_PROVIDER_SITE_OTHER): Payer: PRIVATE HEALTH INSURANCE

## 2020-02-12 DIAGNOSIS — Z23 Encounter for immunization: Secondary | ICD-10-CM

## 2020-02-24 ENCOUNTER — Ambulatory Visit: Payer: 59 | Admitting: Physician Assistant

## 2020-03-10 ENCOUNTER — Ambulatory Visit: Payer: 59 | Admitting: Physician Assistant

## 2020-04-12 ENCOUNTER — Other Ambulatory Visit: Payer: Self-pay | Admitting: Family Medicine

## 2020-04-12 ENCOUNTER — Encounter: Payer: Self-pay | Admitting: Gastroenterology

## 2020-04-12 DIAGNOSIS — F5101 Primary insomnia: Secondary | ICD-10-CM

## 2020-06-04 ENCOUNTER — Other Ambulatory Visit: Payer: Self-pay

## 2020-06-04 ENCOUNTER — Ambulatory Visit (AMBULATORY_SURGERY_CENTER): Payer: Self-pay

## 2020-06-04 VITALS — Ht 65.0 in | Wt 246.0 lb

## 2020-06-04 DIAGNOSIS — Z800 Family history of malignant neoplasm of digestive organs: Secondary | ICD-10-CM

## 2020-06-04 DIAGNOSIS — Z86010 Personal history of colonic polyps: Secondary | ICD-10-CM

## 2020-06-04 MED ORDER — NA SULFATE-K SULFATE-MG SULF 17.5-3.13-1.6 GM/177ML PO SOLN: 1.0000 | Freq: Once | ORAL | 0 refills | Status: AC

## 2020-06-04 NOTE — Progress Notes (Signed)
No egg or soy allergy known to patient  No issues with past sedation with any surgeries or procedures No intubation problems in the past  No FH of Malignant Hyperthermia No diet pills per patient No home 02 use per patient  No blood thinners per patient  Pt denies issues with constipation  No A fib or A flutter  EMMI video via MyChart  COVID 19 guidelines implemented in PV today with Pt and RN  Pt is fully vaccinated  for Covid x2 + booster= Coupon given to pt in PV today , Code to Pharmacy  Due to the COVID-19 pandemic we are asking patients to follow certain guidelines.  Pt aware of COVID protocols and [facility] guidelines   

## 2020-06-09 ENCOUNTER — Telehealth: Payer: Self-pay

## 2020-06-09 DIAGNOSIS — F5101 Primary insomnia: Secondary | ICD-10-CM

## 2020-06-09 MED ORDER — TRAZODONE HCL 50 MG PO TABS
25.0000 mg | ORAL_TABLET | Freq: Every evening | ORAL | 1 refills | Status: DC | PRN
Start: 2020-06-09 — End: 2020-10-07

## 2020-06-09 NOTE — Telephone Encounter (Signed)
..   LAST APPOINTMENT DATE: 11/18/2019   NEXT APPOINTMENT DATE:@7 /05/2020  MEDICATION:traZODone (DESYREL) 50 MG tablet    PHARMACY:[id number] [place], [place] - [address]  **Let patient know to contact pharmacy at the end of the day to make sure medication is ready. **  ** Please notify patient to allow 48-72 hours to process**  **Encourage patient to contact the pharmacy for refills or they can request refills through [facility]**  CLINICAL FILLS OUT ALL BELOW:   LAST REFILL:  QTY:  REFILL DATE:    OTHER COMMENTS:    Okay for refill?  Please advise

## 2020-06-09 NOTE — Telephone Encounter (Signed)
Rx sent to pharmacy   

## 2020-06-11 ENCOUNTER — Encounter: Payer: Self-pay | Admitting: Gastroenterology

## 2020-06-14 ENCOUNTER — Telehealth: Payer: Self-pay | Admitting: Gastroenterology

## 2020-06-14 NOTE — Telephone Encounter (Signed)
Good morning Dr. Silverio Decamp, this patient tested positive for covid.  Procedure rescheduled from 06/18/2020 to 06/25/2020.

## 2020-06-14 NOTE — Telephone Encounter (Signed)
ok 

## 2020-06-15 ENCOUNTER — Telehealth (INDEPENDENT_AMBULATORY_CARE_PROVIDER_SITE_OTHER): Payer: 59 | Admitting: Family Medicine

## 2020-06-15 DIAGNOSIS — U071 COVID-19: Secondary | ICD-10-CM

## 2020-06-15 MED ORDER — BENZONATATE 100 MG PO CAPS: 100.0000 mg | ORAL_CAPSULE | Freq: Three times a day (TID) | ORAL | 0 refills | Status: DC | PRN

## 2020-06-15 NOTE — Patient Instructions (Signed)
   ---------------------------------------------------------------------------------------------------------------------------      WORK SLIP:  Patient Tricia [name],  May 25, 1960, was seen for a medical visit today, 06/15/20 . Please excuse from work for a COVID like illness. We advise 10 days minimum from the onset of symptoms (06/11/20) PLUS 1 day of no fever and improved symptoms. Will defer to employer for a sooner return to work if symptoms have resolved, it is greater than 5 days since the positive test and the patient can wear a high-quality, tight fitting mask such as N95 or KN95 at all times for an additional 5 days. Would also suggest COVID19 antigen testing is negative prior to return.  Sincerely: E-signature: Dr. Colin Benton, DO [place] Ph: [phone]   ------------------------------------------------------------------------------------------------------------------------------  HOME CARE TIPS:  -[facility] [place] testing information: https://[email or web address].org/ OR 212-880-9016 Most pharmacies also offer testing and home test kits.  -I sent the medication(s) we discussed to your pharmacy: Meds ordered this encounter  Medications  . benzonatate (TESSALON PERLES) 100 MG capsule    Sig: Take 1 capsule (100 mg total) by mouth 3 (three) times daily as needed.    Dispense:  20 capsule    Refill:  0    -can use tylenol or aleve if needed for fevers, aches and pains per instructions  -can use nasal saline a few times per day if you have nasal congestion; sometimes  a short course of Afrin nasal spray for 3 days can help with symptoms as well  -stay hydrated, drink plenty of fluids and eat small healthy meals - avoid dairy  -can take 1000 IU (29mcg) Vit D3 and 100-500 mg of Vit C daily per instructions  -If the Covid test is positive, check out the CDC website for more information on home care, transmission and  treatment for COVID19  -follow up with your doctor in 2-3 days unless improving and feeling better  -stay home while sick, except to seek medical care, and if you have [place] ideally it would be best to stay home for a full 10 days since the onset of symptoms PLUS one day of no fever and feeling better. Wear a good mask (such as N95 or KN95) if around others to reduce the risk of transmission.  It was nice to meet you today, and I really hope you are feeling better soon. I help [facility] out with telemedicine visits on Tuesdays and Thursdays and am available for visits on those days. If you have any concerns or questions following this visit please schedule a follow up visit with your Primary Care doctor or seek care at a local urgent care clinic to avoid delays in care.    Seek in person care or schedule a follow up video visit promptly if your symptoms worsen, new concerns arise or you are not improving with treatment. Call 911 and/or seek emergency care if your symptoms are severe or life threatening.

## 2020-06-15 NOTE — Progress Notes (Signed)
Virtual Visit via Video Note  I connected with@  on 06/15/20 at  1:20 PM EST by a video enabled telemedicine application and verified that I am speaking with the correct person using two identifiers.  Location patient: home, [place] Location provider:work or home office Persons participating in the virtual visit: patient, provider  I discussed the limitations of evaluation and management by telemedicine and the availability of in person appointments. The patient expressed understanding and agreed to proceed.   HPI:  Acute telemedicine visit for COVID19: -Onset: 06/11/20; had positive covid test the next days -Symptoms include: scratchy throat, sore throat, nasal congestion, ear discomfort, mild cough, loss of taste, poor appetite, low grade fever yesterday -Denies:body aches, CP, SOB, NVD, inability to eat/drink/get out of bed -Has tried: sudafed, dayquil, nyquil, tylenol, emergenC -Pertinent past medical history: obesity -Pertinent medication allergies:codeine, phenytoin -COVID-19 vaccine status:fully vaccinated and booster and had the flu shot  ROS: See pertinent positives and negatives per HPI.  Past Medical History:  Diagnosis Date  . Allergy    seasonal allergies  . Cataract    not a surgical candidate at this time (06/04/2020)  . Colon polyps 2006, 2011   TUBULAR ADENOMA AND HYPERPLASTIC POLYP  . Esophageal stricture 2012  . GERD (gastroesophageal reflux disease)    with certain foods  . Glucosuria    with normal blood sugar  . Hepatitis C aby neg viral copies    serology positive but neg viral copies   . HLD (hyperlipidemia)    diet controlled  . Jaundice due to hepatitis    hx when younger unknown cause resolved   . Meningioma ([place])   . Migraine headache    some aura  period related  imitrex helps  . MVP (mitral valve prolapse)    echo 02 normal  . Seizures ([place])    last noted seizure 1998-current on med to control  . Ulcer 2012    Past Surgical History:   Procedure Laterality Date  . APPENDECTOMY    . BUNIONECTOMY     laft  . CHOLECYSTECTOMY  2015   Dr Johney Maine  . CIN D&C conization  2000  . COLONOSCOPY  2016   KN-MAC-prep good (Suprep), HPP polypoid  . COLONOSCOPY W/ BIOPSIES  2011   SEVERAL   . CRANIOTOMY    . CYSTECTOMY  1982  . ESOPHAGOGASTRODUODENOSCOPY  2012  . frontal meningioma resected  1998  . left shoulder dislocation surgery  2005  . POLYPECTOMY  2016   HPP polypoid  . TUBAL LIGATION    . WISDOM TOOTH EXTRACTION    . WRIST SURGERY  04/21/2017   rt-cyst removal      Current Outpatient Medications:  .  benzonatate (TESSALON PERLES) 100 MG capsule, Take 1 capsule (100 mg total) by mouth 3 (three) times daily as needed., Disp: 20 capsule, Rfl: 0 .  chlorhexidine (PERIDEX) 0.12 % solution, chlorhexidine gluconate 0.12 % mouthwash  PLEASE SEE ATTACHED FOR DETAILED DIRECTIONS, Disp: , Rfl:  .  PHENobarbital (LUMINAL) 97.2 MG tablet, Take 97.2 mg by mouth at bedtime., Disp: , Rfl:  .  SUMAtriptan (IMITREX) 100 MG tablet, Take 1 tablet (100 mg total) by mouth once for 1 dose. May repeat in 2 hours if headache persists or recurs., Disp: 10 tablet, Rfl: 0 .  traZODone (DESYREL) 50 MG tablet, Take 0.5-1 tablets (25-50 mg total) by mouth at bedtime as needed for sleep., Disp: 90 tablet, Rfl: 1 .  Vitamin D, Cholecalciferol, 25 MCG (1000 UT)  TABS, Take 1 tablet by mouth daily., Disp: , Rfl:   EXAM:  VITALS per patient if applicable:  GENERAL: alert, oriented, appears well and in no acute distress  HEENT: atraumatic, conjunttiva clear, no obvious abnormalities on inspection of external nose and ears  NECK: normal movements of the head and neck  LUNGS: on inspection no signs of respiratory distress, breathing rate appears normal, no obvious gross SOB, gasping or wheezing  CV: no obvious cyanosis  MS: moves all visible extremities without noticeable abnormality  PSYCH/NEURO: pleasant and cooperative, no obvious depression or  anxiety, speech and thought processing grossly intact  ASSESSMENT AND PLAN:  Discussed the following assessment and plan:  COVID-19  -we discussed possible serious and likely etiologies, options for evaluation and workup, limitations of telemedicine visit vs in person visit, treatment, treatment risks and precautions. Pt prefers to treat via telemedicine empirically rather than in person at this moment. Discussed treatment options, potential complications, isolation and precautions. She opted for Tessalon rx for cough, nasal saline, analgesics if needed and other home care measures per patient instructions. Declined referral for treatment. Work/School slipped offered: provided in patient instructions  Scheduled follow up with PCP offered: agrees to schedule follow up if needed. Advised to seek prompt in person care if worsening, new symptoms arise, or if is not improving with treatment. Discussed options for inperson care if PCP office not available. Did let this patient know that I only do telemedicine on Tuesdays and Thursdays for [place]. Advised to schedule follow up visit with PCP or [facility] if any further questions or concerns to avoid delays in care.   I discussed the assessment and treatment plan with the patient. The patient was provided an opportunity to ask questions and all were answered. The patient agreed with the plan and demonstrated an understanding of the instructions.     Lucretia Kern, DO

## 2020-06-18 ENCOUNTER — Encounter: Payer: PRIVATE HEALTH INSURANCE | Admitting: Gastroenterology

## 2020-06-25 ENCOUNTER — Encounter: Payer: 59 | Admitting: Gastroenterology

## 2020-07-02 ENCOUNTER — Other Ambulatory Visit: Payer: Self-pay | Admitting: Obstetrics and Gynecology

## 2020-07-02 DIAGNOSIS — Z1231 Encounter for screening mammogram for malignant neoplasm of breast: Secondary | ICD-10-CM

## 2020-08-11 ENCOUNTER — Encounter: Payer: Self-pay | Admitting: Gastroenterology

## 2020-08-11 ENCOUNTER — Other Ambulatory Visit: Payer: Self-pay

## 2020-08-11 ENCOUNTER — Ambulatory Visit (AMBULATORY_SURGERY_CENTER): Payer: 59 | Admitting: Gastroenterology

## 2020-08-11 VITALS — BP 119/66 | HR 64 | Temp 97.5°F | Resp 10 | Ht 65.0 in | Wt 246.0 lb

## 2020-08-11 DIAGNOSIS — Z800 Family history of malignant neoplasm of digestive organs: Secondary | ICD-10-CM

## 2020-08-11 DIAGNOSIS — D123 Benign neoplasm of transverse colon: Secondary | ICD-10-CM

## 2020-08-11 DIAGNOSIS — Z860100 Personal history of colon polyps, unspecified: Secondary | ICD-10-CM

## 2020-08-11 DIAGNOSIS — D120 Benign neoplasm of cecum: Secondary | ICD-10-CM | POA: Diagnosis not present

## 2020-08-11 MED ORDER — SODIUM CHLORIDE 0.9 % IV SOLN: 500.0000 mL | Freq: Once | INTRAVENOUS | Status: DC

## 2020-08-11 NOTE — Progress Notes (Signed)
Called to room to assist during endoscopic procedure.  Patient ID and intended procedure confirmed with present staff. Received instructions for my participation in the procedure from the performing physician.  

## 2020-08-11 NOTE — Op Note (Signed)
[facility] Patient Name: Tricia [name] Procedure Date: 08/11/2020 8:35 AM MRN: [id number] Endoscopist: Mauri Pole , MD Age: 60 Referring MD:  Date of Birth: 17-Nov-1960 Gender: Female Account #: [id number] Procedure:                Colonoscopy Indications:              Screening in patient at increased risk: Family                            history of 1st-degree relative with colorectal                            cancer, Colon cancer screening in patient at                            increased risk: Family history of colorectal cancer                            in multiple 2nd degree relatives, High risk colon                            cancer surveillance: Personal history of colonic                            polyps Medicines:                Monitored Anesthesia Care Procedure:                Pre-Anesthesia Assessment:                           - Prior to the procedure, a History and Physical                            was performed, and patient medications and                            allergies were reviewed. The patient's tolerance of                            previous anesthesia was also reviewed. The risks                            and benefits of the procedure and the sedation                            options and risks were discussed with the patient.                            All questions were answered, and informed consent                            was obtained. Prior Anticoagulants: The patient has  taken no previous anticoagulant or antiplatelet                            agents. ASA Grade Assessment: II - A patient with                            mild systemic disease. After reviewing the risks                            and benefits, the patient was deemed in                            satisfactory condition to undergo the procedure.                           After obtaining informed consent, the colonoscope                             was passed under direct vision. Throughout the                            procedure, the patient's blood pressure, pulse, and                            oxygen saturations were monitored continuously. The                            Olympus PFC-[id number] (#[id number]) Colonoscope was                            introduced through the anus and advanced to the the                            cecum, identified by appendiceal orifice and                            ileocecal valve. The colonoscopy was performed                            without difficulty. The patient tolerated the                            procedure well. The quality of the bowel                            preparation was good. The ileocecal valve,                            appendiceal orifice, and rectum were photographed. Scope In: 8:42:09 AM Scope Out: 9:00:00 AM Scope Withdrawal Time: 0 hours 11 minutes 29 seconds  Total Procedure Duration: 0 hours 17 minutes 51 seconds  Findings:                 The perianal and digital rectal examinations were  normal.                           A less than 1 mm polyp was found in the cecum. The                            polyp was sessile. The polyp was removed with a                            cold biopsy forceps. Resection and retrieval were                            complete.                           Three sessile polyps were found in the transverse                            colon. The polyps were 4 to 6 mm in size. These                            polyps were removed with a cold snare. Resection                            and retrieval were complete.                           Scattered small and large-mouthed diverticula were                            found in the sigmoid colon, descending colon and                            transverse colon.                           Non-bleeding internal hemorrhoids were found during                             retroflexion. The hemorrhoids were medium-sized. Complications:            No immediate complications. Estimated Blood Loss:     Estimated blood loss was minimal. Impression:               - One less than 1 mm polyp in the cecum, removed                            with a cold biopsy forceps. Resected and retrieved.                           - Three 4 to 6 mm polyps in the transverse colon,                            removed with a cold snare. Resected and retrieved.                           -  Diverticulosis in the sigmoid colon, in the                            descending colon and in the transverse colon.                           - Non-bleeding internal hemorrhoids. Recommendation:           - Patient has a contact number available for                            emergencies. The signs and symptoms of potential                            delayed complications were discussed with the                            patient. Return to normal activities tomorrow.                            Written discharge instructions were provided to the                            patient.                           - Resume previous diet.                           - Continue present medications.                           - Await pathology results.                           - Repeat colonoscopy in 3 - 5 years for                            surveillance based on pathology results. Mauri Pole, MD 08/11/2020 9:05:42 AM This report has been signed electronically.

## 2020-08-11 NOTE — Progress Notes (Signed)
To PACU, VSS. Report to [email or web address] 

## 2020-08-11 NOTE — Patient Instructions (Signed)
Discharge instructions given. Handouts on polyps,diverticulosis and Hemorrhoids. Resume previous medications. YOU HAD AN ENDOSCOPIC PROCEDURE TODAY AT THE [facility]:   Refer to the procedure report that was given to you for any specific questions about what was found during the examination.  If the procedure report does not answer your questions, please call your gastroenterologist to clarify.  If you requested that your care partner not be given the details of your procedure findings, then the procedure report has been included in a sealed envelope for you to review at your convenience later.  YOU SHOULD EXPECT: Some feelings of bloating in the abdomen. Passage of more gas than usual.  Walking can help get rid of the air that was put into your GI tract during the procedure and reduce the bloating. If you had a lower endoscopy (such as a colonoscopy or flexible sigmoidoscopy) you may notice spotting of blood in your stool or on the toilet paper. If you underwent a bowel prep for your procedure, you may not have a normal bowel movement for a few days.  Please Note:  You might notice some irritation and congestion in your nose or some drainage.  This is from the oxygen used during your procedure.  There is no need for concern and it should clear up in a day or so.  SYMPTOMS TO REPORT IMMEDIATELY:  Following lower endoscopy (colonoscopy or flexible sigmoidoscopy):  Excessive amounts of blood in the stool  Significant tenderness or worsening of abdominal pains  Swelling of the abdomen that is new, acute  Fever of 100F or higher   For urgent or emergent issues, a gastroenterologist can be reached at any hour by calling [phone]. Do not use MyChart messaging for urgent concerns.    DIET:  We do recommend a small meal at first, but then you may proceed to your regular diet.  Drink plenty of fluids but you should avoid alcoholic beverages for 24 hours.  ACTIVITY:  You should  plan to take it easy for the rest of today and you should NOT DRIVE or use heavy machinery until tomorrow (because of the sedation medicines used during the test).    FOLLOW UP: Our staff will call the number listed on your records 48-72 hours following your procedure to check on you and address any questions or concerns that you may have regarding the information given to you following your procedure. If we do not reach you, we will leave a message.  We will attempt to reach you two times.  During this call, we will ask if you have developed any symptoms of COVID 19. If you develop any symptoms (ie: fever, flu-like symptoms, shortness of breath, cough etc.) before then, please call [phone].  If you test positive for Covid 19 in the 2 weeks post procedure, please call and report this information to us.    If any biopsies were taken you will be contacted by phone or by letter within the next 1-3 weeks.  Please call us at [phone] if you have not heard about the biopsies in 3 weeks.    SIGNATURES/CONFIDENTIALITY: You and/or your care partner have signed paperwork which will be entered into your electronic medical record.  These signatures attest to the fact that that the information above on your After Visit Summary has been reviewed and is understood.  Full responsibility of the confidentiality of this discharge information lies with you and/or your care-partner.   

## 2020-08-13 ENCOUNTER — Telehealth: Payer: Self-pay | Admitting: *Deleted

## 2020-08-13 NOTE — Telephone Encounter (Signed)
  Follow up Call-  Call back number 08/11/2020  Post procedure Call Back phone  # [phone]  Permission to leave phone message Yes  Some recent data might be hidden     Patient questions:  Do you have a fever, pain , or abdominal swelling? No. Pain Score  0 *  Have you tolerated food without any problems? Yes.    Have you been able to return to your normal activities? Yes.    Do you have any questions about your discharge instructions: Diet   No. Medications  No. Follow up visit  No.  Do you have questions or concerns about your Care? No.  Actions: * If pain score is 4 or above: 1. No action needed, pain <4.Have you developed a fever since your procedure? no  2.   Have you had an respiratory symptoms (SOB or cough) since your procedure? no  3.   Have you tested positive for COVID 19 since your procedure no  4.   Have you had any family members/close contacts diagnosed with the COVID 19 since your procedure?  no   If yes to any of these questions please route to Joylene John, RN and Joella Prince, RN

## 2020-08-27 ENCOUNTER — Encounter: Payer: Self-pay | Admitting: Gastroenterology

## 2020-10-07 ENCOUNTER — Other Ambulatory Visit: Payer: Self-pay | Admitting: Physician Assistant

## 2020-10-07 DIAGNOSIS — F5101 Primary insomnia: Secondary | ICD-10-CM

## 2020-11-02 ENCOUNTER — Ambulatory Visit
Admission: RE | Admit: 2020-11-02 | Discharge: 2020-11-02 | Disposition: A | Discharge status: Home / Self Care | Payer: 59 | Source: Ambulatory Visit | Attending: Obstetrics and Gynecology | Admitting: Obstetrics and Gynecology

## 2020-11-02 ENCOUNTER — Other Ambulatory Visit: Payer: Self-pay

## 2020-11-02 DIAGNOSIS — Z1231 Encounter for screening mammogram for malignant neoplasm of breast: Secondary | ICD-10-CM

## 2020-11-19 ENCOUNTER — Encounter: Payer: 59 | Admitting: Physician Assistant

## 2020-12-09 ENCOUNTER — Encounter: Payer: Self-pay | Admitting: Physician Assistant

## 2020-12-09 ENCOUNTER — Telehealth: Payer: 59 | Admitting: Physician Assistant

## 2020-12-09 ENCOUNTER — Telehealth: Payer: Self-pay

## 2020-12-09 DIAGNOSIS — J302 Other seasonal allergic rhinitis: Secondary | ICD-10-CM

## 2020-12-09 DIAGNOSIS — F5101 Primary insomnia: Secondary | ICD-10-CM

## 2020-12-09 MED ORDER — TRAZODONE HCL 50 MG PO TABS: ORAL_TABLET | ORAL | 0 refills | Status: DC

## 2020-12-09 MED ORDER — LEVOCETIRIZINE DIHYDROCHLORIDE 5 MG PO TABS: 5.0000 mg | ORAL_TABLET | Freq: Every evening | ORAL | 0 refills | Status: DC

## 2020-12-09 MED ORDER — FLUTICASONE PROPIONATE 50 MCG/ACT NA SUSP: 2.0000 | Freq: Every day | NASAL | 6 refills | Status: DC

## 2020-12-09 NOTE — Telephone Encounter (Signed)
  LAST APPOINTMENT DATE:    NEXT APPOINTMENT DATE:@9 /04/2021  MEDICATION:  Is the patient out of medication? 4 pills left  PHARMACY:

## 2020-12-09 NOTE — Telephone Encounter (Signed)
Left detailed message on personal voicemail Rx was sent to pharmacy. Please keep appt scheduled in Sept with Samantha. Any questions please call office.

## 2020-12-09 NOTE — Progress Notes (Signed)
E visit for Allergic Rhinitis We are sorry that you are not feeling well.  Here is how we plan to help!  Based on what you have shared with me it looks like you have Allergic Rhinitis.  Rhinitis is when a reaction occurs that causes nasal congestion, runny nose, sneezing, and itching.  Most types of rhinitis are caused by an inflammation and are associated with symptoms in the eyes ears or throat. There are several types of rhinitis.  The most common are acute rhinitis, which is usually caused by a viral illness, allergic or seasonal rhinitis, and nonallergic or year-round rhinitis.  Nasal allergies occur certain times of the year.  Allergic rhinitis is caused when allergens in the air trigger the release of histamine in the body.  Histamine causes itching, swelling, and fluid to build up in the fragile linings of the nasal passages, sinuses and eyelids.  An itchy nose and clear discharge are common.  I recommend the following over the counter treatments: Xyzal 5 mg take 1 tablet daily; stop claritin  I also would recommend a nasal spray: Flonase 2 sprays into each nostril once daily and Saline 1 spray into each nostril as needed  HOME CARE:  You can use an over-the-counter saline nasal spray as needed Avoid areas where there is heavy dust, mites, or molds Stay indoors on windy days during the pollen season Keep windows closed in home, at least in bedroom; use air conditioner. Use high-efficiency house air filter Keep windows closed in car, turn AC on re-circulate Avoid playing out with dog during pollen season  GET HELP RIGHT AWAY IF:  If your symptoms do not improve within 10 days You become short of breath You develop yellow or green discharge from your nose for over 3 days You have coughing fits  MAKE SURE YOU:  Understand these instructions Will watch your condition Will get help right away if you are not doing well or get worse  Thank you for choosing an e-visit. Your  e-visit answers were reviewed by a board certified advanced clinical practitioner to complete your personal care plan. Depending upon the condition, your plan could have included both over the counter or prescription medications. Please review your pharmacy choice. Be sure that the pharmacy you have chosen is open so that you can pick up your prescription now.  If there is a problem you may message your provider in [place] to have the prescription routed to another pharmacy. Your safety is important to [place]. If you have drug allergies check your prescription carefully.  For the next 24 hours, you can use MyChart to ask questions about today's visit, request a non-urgent call back, or ask for a work or school excuse from your e-visit provider. You will get an email in the next two days asking about your experience. I hope that your e-visit has been valuable and will speed your recovery.   I provided 6 minutes of non face-to-face time during this encounter for chart review and documentation.

## 2020-12-09 NOTE — Telephone Encounter (Signed)
  LAST APPOINTMENT DATE:  11/01/19  NEXT APPOINTMENT DATE:@9 /04/2021  MEDICATION:traZODone (DESYREL) 50 MG tablet  Is the patient out of medication? 4 pills left   PHARMACY:[id number] [address],  - [place]

## 2021-01-31 ENCOUNTER — Other Ambulatory Visit: Payer: Self-pay

## 2021-01-31 ENCOUNTER — Ambulatory Visit (INDEPENDENT_AMBULATORY_CARE_PROVIDER_SITE_OTHER): Payer: 59 | Admitting: Physician Assistant

## 2021-01-31 ENCOUNTER — Encounter: Payer: Self-pay | Admitting: Physician Assistant

## 2021-01-31 VITALS — BP 137/84 | HR 69 | Temp 98.5°F | Ht 65.0 in | Wt 265.4 lb

## 2021-01-31 DIAGNOSIS — E559 Vitamin D deficiency, unspecified: Secondary | ICD-10-CM | POA: Diagnosis not present

## 2021-01-31 DIAGNOSIS — E7800 Pure hypercholesterolemia, unspecified: Secondary | ICD-10-CM

## 2021-01-31 DIAGNOSIS — J302 Other seasonal allergic rhinitis: Secondary | ICD-10-CM

## 2021-01-31 DIAGNOSIS — Z0000 Encounter for general adult medical examination without abnormal findings: Secondary | ICD-10-CM

## 2021-01-31 DIAGNOSIS — Z23 Encounter for immunization: Secondary | ICD-10-CM | POA: Diagnosis not present

## 2021-01-31 LAB — CBC WITH DIFFERENTIAL/PLATELET
Basophils Absolute: 0.1 10*3/uL (ref 0.0–0.1)
Basophils Relative: 1.8 % (ref 0.0–3.0)
Eosinophils Absolute: 0.2 10*3/uL (ref 0.0–0.7)
Eosinophils Relative: 3.5 % (ref 0.0–5.0)
HCT: 42.4 % (ref 36.0–46.0)
Hemoglobin: 13.6 g/dL (ref 12.0–15.0)
Lymphocytes Relative: 19.1 % (ref 12.0–46.0)
Lymphs Abs: 1.2 10*3/uL (ref 0.7–4.0)
MCHC: 32 g/dL (ref 30.0–36.0)
MCV: 87.6 fl (ref 78.0–100.0)
Monocytes Absolute: 0.4 10*3/uL (ref 0.1–1.0)
Monocytes Relative: 6.4 % (ref 3.0–12.0)
Neutro Abs: 4.4 10*3/uL (ref 1.4–7.7)
Neutrophils Relative %: 69.2 % (ref 43.0–77.0)
Platelets: 260 10*3/uL (ref 150.0–400.0)
RBC: 4.84 Mil/uL (ref 3.87–5.11)
RDW: 13.5 % (ref 11.5–15.5)
WBC: 6.4 10*3/uL (ref 4.0–10.5)

## 2021-01-31 LAB — COMPREHENSIVE METABOLIC PANEL
ALT: 14 U/L (ref 0–35)
AST: 14 U/L (ref 0–37)
Albumin: 4 g/dL (ref 3.5–5.2)
Alkaline Phosphatase: 89 U/L (ref 39–117)
BUN: 14 mg/dL (ref 6–23)
CO2: 29 mEq/L (ref 19–32)
Calcium: 9.1 mg/dL (ref 8.4–10.5)
Chloride: 103 mEq/L (ref 96–112)
Creatinine, Ser: 0.73 mg/dL (ref 0.40–1.20)
GFR: 88.04 mL/min (ref >60.00)
Glucose, Bld: 81 mg/dL (ref 70–99)
Potassium: 4.6 mEq/L (ref 3.5–5.1)
Sodium: 140 mEq/L (ref 135–145)
Total Bilirubin: 0.3 mg/dL (ref 0.2–1.2)
Total Protein: 6.8 g/dL (ref 6.0–8.3)

## 2021-01-31 LAB — VITAMIN D 25 HYDROXY (VIT D DEFICIENCY, FRACTURES): VITD: 30.05 ng/mL (ref 30.00–100.00)

## 2021-01-31 LAB — LIPID PANEL
Cholesterol: 199 mg/dL (ref 0–200)
HDL: 57 mg/dL (ref >39.00)
LDL Cholesterol: 114 mg/dL — ABNORMAL HIGH (ref 0–99)
NonHDL: 142.21
Total CHOL/HDL Ratio: 3
Triglycerides: 139 mg/dL (ref 0.0–149.0)
VLDL: 27.8 mg/dL (ref 0.0–40.0)

## 2021-01-31 MED ORDER — TIRZEPATIDE 5 MG/0.5ML ~~LOC~~ SOAJ: 5.0000 mg | SUBCUTANEOUS | 1 refills | Status: DC

## 2021-01-31 NOTE — Patient Instructions (Signed)
It was great to see you! ? ?Please go to the lab for blood work.  ? ?Our office will call you with your results unless you have chosen to receive results via MyChart. ? ?If your blood work is normal we will follow-up each year for physicals and as scheduled for chronic medical problems. ? ?If anything is abnormal we will treat accordingly and get you in for a follow-up. ? ?Take care, ? ?Coree Brame ?  ? ? ?

## 2021-01-31 NOTE — Progress Notes (Signed)
Subjective:    Tricia [name] is a 60 y.o. female and is here for a comprehensive physical exam.   HPI  Health Maintenance Due  Topic Date Due   Pneumococcal Vaccine 78-38 Years old (1 - PCV) Never done   COVID-19 Vaccine (4 - Booster for Pfizer series) 07/21/2020   INFLUENZA VACCINE  12/20/2020    Acute Concerns: None  Chronic Issues: Obesity -- has tried phentermine in the past. Considered Wegovy in the past but didn't take it. Having bunion surgery soon and will plan to start walking more regularly then. Working on better eating habits as able but does struggle with unhealthy choices at work. Vit D def -- hx of this. Not on current Vit D supplement Allergies -- takes xyzal 5 mg daily, tolerates well. Still having some breakthrough congestion at times HLD - not on any meds  Health Maintenance: Immunizations -- flu shot today Colonoscopy -- UTD Mammogram -- UTD PAP -- sees gynecology Diet -- having difficulty eating healthy at work;  Sleep habits -- improved ; takes 50 mg trazodone Exercise -- not exercising currently Weight -- Weight: 265 lb 6.4 oz (120.4 kg)  Mood -- overall good Weight history: Wt Readings from Last 10 Encounters:  01/31/21 265 lb 6.4 oz (120.4 kg)  08/11/20 246 lb (111.6 kg)  06/04/20 246 lb (111.6 kg)  11/18/19 246 lb 4 oz (111.7 kg)  06/23/19 237 lb 4 oz (107.6 kg)  03/14/19 235 lb 6.4 oz (106.8 kg)  12/11/18 232 lb (105.2 kg)  11/14/18 228 lb 6.4 oz (103.6 kg)  07/30/18 229 lb (103.9 kg)  07/01/18 230 lb 12.8 oz (104.7 kg)   Body mass index is 44.16 kg/m. Patient's last menstrual period was 12/23/2010. Alcohol use:  reports current alcohol use. Tobacco use:  Tobacco Use: Medium Risk   Smoking Tobacco Use: Former   Smokeless Tobacco Use: Never     Depression screen PHQ 2/9 11/18/2019  Decreased Interest 0  Down, Depressed, Hopeless 0  PHQ - 2 Score 0  Altered sleeping -  Tired, decreased energy -  Change in appetite -   Feeling bad or failure about yourself  -  Trouble concentrating -  Moving slowly or fidgety/restless -  Suicidal thoughts -  PHQ-9 Score -  Difficult doing work/chores -     Other providers/specialists: Patient Care Team: Inda Coke, [place] as PCP - General (Physician Assistant) Jovita Gamma, MD (Neurosurgery) Inda Castle, MD (Inactive) (Gastroenterology) Newton Pigg, MD (Obstetrics and Gynecology) Lindwood Coke, MD as Attending Physician (Dermatology) Elesa Massed, NP (Inactive) as Nurse Practitioner (Obstetrics and Gynecology)    PMHx, SurgHx, SocialHx, Medications, and Allergies were reviewed in the Visit Navigator and updated as appropriate.   Past Medical History:  Diagnosis Date   Allergy    seasonal allergies   Cataract    not a surgical candidate at this time (06/04/2020)   Colon polyps 2006, 2011   TUBULAR ADENOMA AND HYPERPLASTIC POLYP   Esophageal stricture 2012   GERD (gastroesophageal reflux disease)    with certain foods   Glucosuria    with normal blood sugar   Hepatitis C aby neg viral copies    serology positive but neg viral copies    HLD (hyperlipidemia)    diet controlled   Jaundice due to hepatitis    hx when younger unknown cause resolved    Meningioma (HCC)    Migraine headache    some aura  period related  imitrex  helps   MVP (mitral valve prolapse)    echo 02 normal   Seizures ([place])    last noted seizure 1998-current on med to control   Ulcer 2012     Past Surgical History:  Procedure Laterality Date   APPENDECTOMY     BUNIONECTOMY     laft   CHOLECYSTECTOMY  2015   Dr Johney Maine   CIN D&C conization  2000   COLONOSCOPY  2016   KN-MAC-prep good (Suprep), HPP polypoid   COLONOSCOPY W/ BIOPSIES  2011   SEVERAL    CRANIOTOMY     CYSTECTOMY  1982   ESOPHAGOGASTRODUODENOSCOPY  2012   frontal meningioma resected  1998   left shoulder dislocation surgery  2005   POLYPECTOMY  2016   HPP polypoid   TUBAL LIGATION      WISDOM TOOTH EXTRACTION     WRIST SURGERY  04/21/2017   rt-cyst removal      Family History  Problem Relation Age of Onset   Colon cancer Father 77   Hyperlipidemia Father    Coronary artery disease Father 27       MI   Diabetes Father        elderly   Colon polyps Father    Heart attack Father 17   Colon cancer Paternal Aunt 77       and again at age 55   Colon polyps Paternal Aunt 65   Colon cancer Paternal Uncle 26   Colon polyps Paternal Uncle 63   Other Brother        transient global amnesia   Colon polyps Brother 68   Crohn's disease Daughter    Coronary artery disease Maternal Uncle 74   Colon cancer Mother 64   Colon polyps Mother 36   Lung cancer Maternal Grandmother    Colon cancer Paternal Grandmother 97   Colon polyps Paternal Grandmother 97   Deep vein thrombosis Paternal Grandfather    Mitral valve prolapse Brother    Breast cancer Neg Hx    Stomach cancer Neg Hx    Rectal cancer Neg Hx    Esophageal cancer Neg Hx     Social History   Tobacco Use   Smoking status: Former    Packs/day: 1.50    Types: Cigarettes    Quit date: 05/22/1990    Years since quitting: 30.7   Smokeless tobacco: Never  Vaping Use   Vaping Use: Never used  Substance Use Topics   Alcohol use: Yes    Comment: socially   Drug use: No    Review of Systems:   Review of Systems  Constitutional:  Negative for chills, fever, malaise/fatigue and weight loss.  HENT:  Negative for hearing loss, sinus pain and sore throat.   Respiratory:  Negative for cough and hemoptysis.   Cardiovascular:  Negative for chest pain, palpitations, leg swelling and PND.  Gastrointestinal:  Negative for abdominal pain, constipation, diarrhea, heartburn, nausea and vomiting.  Genitourinary:  Negative for dysuria, frequency and urgency.  Musculoskeletal:  Negative for back pain, myalgias and neck pain.  Skin:  Negative for itching and rash.  Neurological:  Negative for dizziness, tingling,  seizures and headaches.  Endo/Heme/Allergies:  Negative for polydipsia.  Psychiatric/Behavioral:  Negative for depression and substance abuse. The patient is not nervous/anxious.    Objective:   BP 137/84   Pulse 69   Temp 98.5 F (36.9 C) (Temporal)   Ht _0  (1.651 m)   Wt 265 lb 6.4  oz (120.4 kg)   LMP 12/23/2010   SpO2 97%   BMI 44.16 kg/m  Body mass index is 44.16 kg/m.   General Appearance:    Alert, cooperative, no distress, appears stated age  Head:    Normocephalic, without obvious abnormality, atraumatic  Eyes:    PERRL, conjunctiva/corneas clear, EOM's intact, fundi    benign, both eyes  Ears:    Normal TM's and external ear canals, both ears  Nose:   Nares normal, septum midline, mucosa normal, no drainage    or sinus tenderness  Throat:   Lips, mucosa, and tongue normal; teeth and gums normal  Neck:   Supple, symmetrical, trachea midline, no adenopathy;    thyroid:  no enlargement/tenderness/nodules; no carotid   bruit or JVD  Back:     Symmetric, no curvature, ROM normal, no CVA tenderness  Lungs:     Clear to auscultation bilaterally, respirations unlabored  Chest Wall:    No tenderness or deformity   Heart:    Regular rate and rhythm, S1 and S2 normal, no murmur, rub or gallop  Breast Exam:    Deferred  Abdomen:     Soft, non-tender, bowel sounds active all four quadrants,    no masses, no organomegaly  Genitalia:    Deferred  Extremities:   Extremities normal, atraumatic, no cyanosis or edema  Pulses:   2+ and symmetric all extremities  Skin:   Skin color, texture, turgor normal, no rashes or lesions  Lymph nodes:   Cervical, supraclavicular, and axillary nodes normal  Neurologic:   CNII-XII intact, normal strength, sensation and reflexes    throughout    Assessment/Plan:   1. Routine physical examination Today patient counseled on age appropriate routine health concerns for screening and prevention, each reviewed and up to date or declined.  Immunizations reviewed and up to date or declined. Labs ordered and reviewed. Risk factors for depression reviewed and negative. Hearing function and visual acuity are intact. ADLs screened and addressed as needed. Functional ability and level of safety reviewed and appropriate. Education, counseling and referrals performed based on assessed risks today. Patient provided with a copy of personalized plan for preventive services.   2. Morbid obesity (HCC) Trial mounjaro 2.5 mg sample given Fill of 5 mg sent to pharmacy Follow-up in 2 months  3. Vitamin D deficiency Update blood work and provide recommendations accordingly  4. Pure hypercholesterolemia Update blood work and provide recommendations accordingly  5. Seasonal allergic rhinitis, unspecified trigger Overall well controlled with xyzal  6. Need for immunization against influenza Completed today    Patient Counseling: _0    Nutrition: Stressed importance of moderation in sodium/caffeine intake, saturated fat and cholesterol, caloric balance, sufficient intake of fresh fruits, vegetables, fiber, calcium, iron, and 1 mg of folate supplement per day (for females capable of pregnancy).  _1    Stressed the importance of regular exercise.   _2    Substance Abuse: Discussed cessation/primary prevention of tobacco, alcohol, or other drug use; driving or other dangerous activities under the influence; availability of treatment for abuse.   _3    Injury prevention: Discussed safety belts, safety helmets, smoke detector, smoking near bedding or upholstery.   _4    Sexuality: Discussed sexually transmitted diseases, partner selection, use of condoms, avoidance of unintended pregnancy  and contraceptive alternatives.  _5    Dental health: Discussed importance of regular tooth brushing, flossing, and dental visits.  _6    Health maintenance and immunizations reviewed. Please refer to Health maintenance section.  Inda Coke,  PA-C [place]

## 2021-02-03 ENCOUNTER — Encounter: Payer: Self-pay | Admitting: Physician Assistant

## 2021-02-03 DIAGNOSIS — G43009 Migraine without aura, not intractable, without status migrainosus: Secondary | ICD-10-CM

## 2021-02-03 MED ORDER — SUMATRIPTAN SUCCINATE 100 MG PO TABS: 100.0000 mg | ORAL_TABLET | Freq: Once | ORAL | 2 refills | Status: AC

## 2021-02-07 LAB — RESULTS CONSOLE HPV: CHL HPV: NEGATIVE

## 2021-02-10 LAB — HM PAP SMEAR: HM Pap smear: NORMAL

## 2021-02-11 ENCOUNTER — Encounter: Payer: Self-pay | Admitting: Physician Assistant

## 2021-03-03 ENCOUNTER — Other Ambulatory Visit: Payer: Self-pay | Admitting: Physician Assistant

## 2021-03-16 ENCOUNTER — Encounter: Payer: Self-pay | Admitting: Physician Assistant

## 2021-03-16 DIAGNOSIS — F5101 Primary insomnia: Secondary | ICD-10-CM

## 2021-03-16 MED ORDER — TRAZODONE HCL 50 MG PO TABS: ORAL_TABLET | ORAL | 0 refills | Status: DC

## 2021-03-18 ENCOUNTER — Other Ambulatory Visit: Payer: Self-pay | Admitting: Physician Assistant

## 2021-03-18 DIAGNOSIS — F5101 Primary insomnia: Secondary | ICD-10-CM

## 2021-04-06 ENCOUNTER — Ambulatory Visit: Payer: 59 | Admitting: Physician Assistant

## 2021-04-21 ENCOUNTER — Encounter: Payer: Self-pay | Admitting: Physician Assistant

## 2021-04-22 ENCOUNTER — Encounter: Payer: Self-pay | Admitting: Physician Assistant

## 2021-04-22 ENCOUNTER — Telehealth (INDEPENDENT_AMBULATORY_CARE_PROVIDER_SITE_OTHER): Payer: 59 | Admitting: Physician Assistant

## 2021-04-22 DIAGNOSIS — R051 Acute cough: Secondary | ICD-10-CM

## 2021-04-22 MED ORDER — AZITHROMYCIN 250 MG PO TABS: ORAL_TABLET | ORAL | 0 refills | Status: AC

## 2021-04-22 NOTE — Progress Notes (Signed)
Virtual Visit via Video Note   I, Tricia [name], connected with  Tricia [name]  ([id number], 1960-08-30) on 04/22/21 at  4:00 PM EST by a video-enabled telemedicine application and verified that I am speaking with the correct person using two identifiers.  Location: Patient: Virtual Visit Location Patient: Home Provider: Virtual Visit Location Provider: Office/Clinic   I discussed the limitations of evaluation and management by telemedicine and the availability of in person appointments. The patient expressed understanding and agreed to proceed.    History of Present Illness: Tricia [name] is a 60 y.o. who identifies as a female who was assigned female at birth, and is being seen today for URI symptoms.  Since Wednesday, she has had nasal congestion, cough, sore throat, itchy/running eyes. Denies fever, chills, body aches, chest pain, SOB. Has been around similar sick people at work.  Did take COVID test - -but this was two weeks ago.  Taking nyquil, zquil, tyenol  HPI: HPI  Problems:  Patient Active Problem List   Diagnosis Date Noted   Insulin resistance 07/30/2018   Insomnia 07/30/2018   Morbid obesity ([place]) 04/29/2018   B12 deficiency 04/29/2018   Retraction of nipple, with normal mammogram and [place], followed by GYN 02/04/2018   Gingivitis, followed by Dentist 08/04/2016   Bilateral leg edema 08/04/2016   Hx of tubal ligation 08/04/2016   Hx of appendectomy 08/04/2016   Hx of cholecystectomy 08/04/2016   Ganglion cyst of volar aspect of right wrist 08/02/2016   Seizure disorder ([place]) 12/06/2013   Esophageal reflux 03/19/2012   Granuloma annulare, 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 Sherwood Gambler given phenobarbital for post surgery seizures, controlled    Migraine headache, with aura, related to  menses, Imitrex abortive    Vitamin D deficiency, taking daily OTC vitamin D 12/24/2007   History of colonic polyps, with polypectomy 2006, 2011 02/27/2007    Allergies:  Allergies  Allergen Reactions   Codeine Sulfate     REACTION: unspecified   Phenytoin Sodium Extended Rash   Medications:  Current Outpatient Medications:    azithromycin (ZITHROMAX) 250 MG tablet, Take 2 tablets on day 1, then 1 tablet daily on days 2 through 5, Disp: 6 tablet, Rfl: 0   MOUNJARO 5 MG/0.5ML Pen, INJECT 5MG  INTO THE SKIN ONCE WEEKLY, Disp: 2 mL, Rfl: 1   PHENobarbital (LUMINAL) 97.2 MG tablet, Take 97.2 mg by mouth at bedtime., Disp: , Rfl:    traZODone (DESYREL) 50 MG tablet, TAKE 0.5-1 TABLETS BY MOUTH AT BEDTIME AS NEEDED FOR SLEEP., Disp: 90 tablet, Rfl: 0   Vitamin D, Cholecalciferol, 25 MCG (1000 UT) TABS, Take 1 tablet by mouth daily., Disp: , Rfl:    chlorhexidine (PERIDEX) 0.12 % solution, chlorhexidine gluconate 0.12 % mouthwash  PLEASE SEE ATTACHED FOR DETAILED DIRECTIONS (Patient not taking: Reported on 04/22/2021), Disp: , Rfl:    fluticasone (FLONASE) 50 MCG/ACT nasal spray, Place 2 sprays into both nostrils daily. (Patient not taking: Reported on 04/22/2021), Disp: 16 g, Rfl: 6   levocetirizine (XYZAL) 5 MG tablet, Take 1 tablet (5 mg total) by mouth every evening. (Patient not taking: Reported on 04/22/2021), Disp: 30 tablet, Rfl: 0   SUMAtriptan (IMITREX) 100 MG tablet, Take 1 tablet (100 mg total) by mouth once for 1 dose. May repeat in 2 hours if headache persists or recurs.,  Disp: 10 tablet, Rfl: 2  Observations/Objective: Patient is well-developed, well-nourished in no acute distress.  Resting comfortably at home.  Head is normocephalic, atraumatic.  No labored breathing.  Speech is clear and coherent with logical content.  Patient is alert and oriented at baseline.    Assessment and Plan: 1. Acute cough No red flags on discussion, patient is not in any obvious distress during our  visit. Discussed progression of most viral illness, and recommended supportive care at this point in time. I did however provide pocket rx for oral azithromcyin should symptoms not improve as anticipated. Discussed over the counter supportive care options, with recommendations to push fluids and rest. Reviewed return precautions including new/worsening fever, SOB, new/worsening cough or other concerns.  Recommended need to self-quarantine and practice social distancing until symptoms resolve. Discussed current recommendations for COVID testing -- recommended that she test today given symptoms. I recommend that patient follow-up if symptoms worsen or persist despite treatment x 7-10 days, sooner if needed.    Follow Up Instructions: I discussed the assessment and treatment plan with the patient. The patient was provided an opportunity to ask questions and all were answered. The patient agreed with the plan and demonstrated an understanding of the instructions.  A copy of instructions were sent to the patient via MyChart unless otherwise noted below.    The patient was advised to call back or seek an in-person evaluation if the symptoms worsen or if the condition fails to improve as anticipated.  Tricia [name], [place]

## 2021-05-30 ENCOUNTER — Ambulatory Visit: Payer: 59 | Admitting: Physician Assistant

## 2021-06-21 ENCOUNTER — Encounter: Payer: Self-pay | Admitting: Physician Assistant

## 2021-06-21 ENCOUNTER — Other Ambulatory Visit: Payer: Self-pay

## 2021-06-21 ENCOUNTER — Other Ambulatory Visit: Payer: Self-pay | Admitting: Physician Assistant

## 2021-06-21 DIAGNOSIS — F5101 Primary insomnia: Secondary | ICD-10-CM

## 2021-06-21 MED ORDER — TRAZODONE HCL 50 MG PO TABS: ORAL_TABLET | ORAL | 0 refills | Status: DC

## 2021-06-21 NOTE — Telephone Encounter (Signed)
Tricia [name] okay to refill Trazodone?

## 2021-08-01 ENCOUNTER — Other Ambulatory Visit: Payer: Self-pay | Admitting: Obstetrics and Gynecology

## 2021-08-01 DIAGNOSIS — Z1231 Encounter for screening mammogram for malignant neoplasm of breast: Secondary | ICD-10-CM

## 2021-09-14 ENCOUNTER — Other Ambulatory Visit: Payer: Self-pay | Admitting: Physician Assistant

## 2021-09-14 DIAGNOSIS — F5101 Primary insomnia: Secondary | ICD-10-CM

## 2021-09-14 MED ORDER — TRAZODONE HCL 50 MG PO TABS
ORAL_TABLET | ORAL | 0 refills | Status: DC
Start: 2021-09-14 — End: 2021-12-20

## 2021-11-03 ENCOUNTER — Ambulatory Visit
Admission: RE | Admit: 2021-11-03 | Discharge: 2021-11-03 | Disposition: A | Discharge status: Home / Self Care | Payer: 59 | Source: Ambulatory Visit | Attending: Obstetrics and Gynecology | Admitting: Obstetrics and Gynecology

## 2021-11-03 DIAGNOSIS — Z1231 Encounter for screening mammogram for malignant neoplasm of breast: Secondary | ICD-10-CM

## 2021-12-20 ENCOUNTER — Other Ambulatory Visit: Payer: Self-pay | Admitting: Physician Assistant

## 2021-12-20 DIAGNOSIS — F5101 Primary insomnia: Secondary | ICD-10-CM

## 2021-12-20 MED ORDER — TRAZODONE HCL 50 MG PO TABS: ORAL_TABLET | ORAL | 0 refills | Status: DC

## 2022-02-01 ENCOUNTER — Encounter: Payer: 59 | Admitting: Physician Assistant

## 2022-02-02 ENCOUNTER — Encounter: Payer: Self-pay | Admitting: Physician Assistant

## 2022-02-02 ENCOUNTER — Ambulatory Visit (INDEPENDENT_AMBULATORY_CARE_PROVIDER_SITE_OTHER): Payer: 59 | Admitting: Physician Assistant

## 2022-02-02 VITALS — BP 110/76 | HR 78 | Temp 97.6°F | Ht 65.0 in | Wt 270.2 lb

## 2022-02-02 DIAGNOSIS — E538 Deficiency of other specified B group vitamins: Secondary | ICD-10-CM

## 2022-02-02 DIAGNOSIS — E7800 Pure hypercholesterolemia, unspecified: Secondary | ICD-10-CM | POA: Diagnosis not present

## 2022-02-02 DIAGNOSIS — Z0000 Encounter for general adult medical examination without abnormal findings: Secondary | ICD-10-CM | POA: Diagnosis not present

## 2022-02-02 DIAGNOSIS — E559 Vitamin D deficiency, unspecified: Secondary | ICD-10-CM

## 2022-02-02 DIAGNOSIS — D329 Benign neoplasm of meninges, unspecified: Secondary | ICD-10-CM

## 2022-02-02 DIAGNOSIS — Z23 Encounter for immunization: Secondary | ICD-10-CM | POA: Diagnosis not present

## 2022-02-02 LAB — LIPID PANEL
Cholesterol: 215 mg/dL — ABNORMAL HIGH (ref 0–200)
HDL: 61 mg/dL (ref >39.00)
LDL Cholesterol: 132 mg/dL — ABNORMAL HIGH (ref 0–99)
NonHDL: 153.8
Total CHOL/HDL Ratio: 4
Triglycerides: 109 mg/dL (ref 0.0–149.0)
VLDL: 21.8 mg/dL (ref 0.0–40.0)

## 2022-02-02 LAB — CBC WITH DIFFERENTIAL/PLATELET
Basophils Absolute: 0.1 10*3/uL (ref 0.0–0.1)
Basophils Relative: 1.7 % (ref 0.0–3.0)
Eosinophils Absolute: 0.4 10*3/uL (ref 0.0–0.7)
Eosinophils Relative: 5.6 % — ABNORMAL HIGH (ref 0.0–5.0)
HCT: 42.8 % (ref 36.0–46.0)
Hemoglobin: 13.8 g/dL (ref 12.0–15.0)
Lymphocytes Relative: 19.5 % (ref 12.0–46.0)
Lymphs Abs: 1.3 10*3/uL (ref 0.7–4.0)
MCHC: 32.2 g/dL (ref 30.0–36.0)
MCV: 87.8 fl (ref 78.0–100.0)
Monocytes Absolute: 0.4 10*3/uL (ref 0.1–1.0)
Monocytes Relative: 6.7 % (ref 3.0–12.0)
Neutro Abs: 4.3 10*3/uL (ref 1.4–7.7)
Neutrophils Relative %: 66.5 % (ref 43.0–77.0)
Platelets: 247 10*3/uL (ref 150.0–400.0)
RBC: 4.88 Mil/uL (ref 3.87–5.11)
RDW: 13.7 % (ref 11.5–15.5)
WBC: 6.5 10*3/uL (ref 4.0–10.5)

## 2022-02-02 LAB — COMPREHENSIVE METABOLIC PANEL
ALT: 23 U/L (ref 0–35)
AST: 19 U/L (ref 0–37)
Albumin: 4 g/dL (ref 3.5–5.2)
Alkaline Phosphatase: 90 U/L (ref 39–117)
BUN: 18 mg/dL (ref 6–23)
CO2: 29 mEq/L (ref 19–32)
Calcium: 9.6 mg/dL (ref 8.4–10.5)
Chloride: 103 mEq/L (ref 96–112)
Creatinine, Ser: 0.83 mg/dL (ref 0.40–1.20)
GFR: 74.94 mL/min (ref >60.00)
Glucose, Bld: 84 mg/dL (ref 70–99)
Potassium: 4.5 mEq/L (ref 3.5–5.1)
Sodium: 140 mEq/L (ref 135–145)
Total Bilirubin: 0.3 mg/dL (ref 0.2–1.2)
Total Protein: 7.2 g/dL (ref 6.0–8.3)

## 2022-02-02 LAB — VITAMIN B12: Vitamin B-12: >1500 pg/mL — ABNORMAL HIGH (ref 211–911)

## 2022-02-02 LAB — VITAMIN D 25 HYDROXY (VIT D DEFICIENCY, FRACTURES): VITD: 61.86 ng/mL (ref 30.00–100.00)

## 2022-02-02 MED ORDER — WEGOVY 0.25 MG/0.5ML ~~LOC~~ SOAJ: 0.2500 mg | SUBCUTANEOUS | 1 refills | Status: DC

## 2022-02-02 NOTE — Progress Notes (Signed)
Subjective:    Tricia [name] is a 61 y.o. female and is here for a comprehensive physical exam.   HPI  Health Maintenance Due  Topic Date Due   TETANUS/TDAP  02/07/2021   INFLUENZA VACCINE  12/20/2021   Acute Concerns: None  Chronic Issues: Vit D and B12 deficiency -- hx of deficiency. Would like to have this checked today.  Meningioma -- managed by neurosurgery. Well controlled on phenobarbital.  HLD -- currently not on medications.   Health Maintenance: Immunizations -- getting flu shot and Tdap today Colonoscopy -- UTD Mammogram -- UTD PAP -- scheduled  Diet -- working on healthy eating Sleep habits -- no major concerns Exercise -- working on regular exercise Weight -- Weight: 270 lb 4 oz (122.6 kg)  Mood -- no concerns Weight history: Wt Readings from Last 10 Encounters:  02/02/22 270 lb 4 oz (122.6 kg)  01/31/21 265 lb 6.4 oz (120.4 kg)  08/11/20 246 lb (111.6 kg)  06/04/20 246 lb (111.6 kg)  11/18/19 246 lb 4 oz (111.7 kg)  06/23/19 237 lb 4 oz (107.6 kg)  03/14/19 235 lb 6.4 oz (106.8 kg)  12/11/18 232 lb (105.2 kg)  11/14/18 228 lb 6.4 oz (103.6 kg)  07/30/18 229 lb (103.9 kg)   Body mass index is 44.97 kg/m. Patient's last menstrual period was 12/21/2010. Alcohol use:  reports current alcohol use. Tobacco use:  Tobacco Use: Medium Risk (02/02/2022)   Patient History    Smoking Tobacco Use: Former    Smokeless Tobacco Use: Never    Passive Exposure: Not on file        02/02/2022    8:16 AM  Depression screen PHQ 2/9  Decreased Interest 0  Down, Depressed, Hopeless 0  PHQ - 2 Score 0     Other providers/specialists: Patient Care Team: Inda Coke, [place] as PCP - General (Physician Assistant) Jovita Gamma, MD (Neurosurgery) Inda Castle, MD (Inactive) (Gastroenterology) Newton Pigg, MD (Obstetrics and Gynecology) Lindwood Coke, MD as Attending Physician (Dermatology) Elesa Massed, NP (Inactive) as Nurse  Practitioner (Obstetrics and Gynecology)    PMHx, SurgHx, SocialHx, Medications, and Allergies were reviewed in the Visit Navigator and updated as appropriate.   Past Medical History:  Diagnosis Date   Allergy    seasonal allergies   Cataract    not a surgical candidate at this time (06/04/2020)   Colon polyps 2006, 2011   TUBULAR ADENOMA AND HYPERPLASTIC POLYP   Esophageal stricture 2012   GERD (gastroesophageal reflux disease)    with certain foods   Glucosuria    with normal blood sugar   Hepatitis C aby neg viral copies    serology positive but neg viral copies    HLD (hyperlipidemia)    diet controlled   Jaundice due to hepatitis    hx when younger unknown cause resolved    Meningioma (HCC)    Migraine headache    some aura  period related  imitrex helps   MVP (mitral valve prolapse)    echo 02 normal   Seizures ([place])    last noted seizure 1998-current on med to control   Ulcer 2012     Past Surgical History:  Procedure Laterality Date   APPENDECTOMY     BUNIONECTOMY     laft   CHOLECYSTECTOMY  2015   Dr Johney Maine   CIN D&C conization  2000   COLONOSCOPY  2016   KN-MAC-prep good (Suprep), HPP polypoid   COLONOSCOPY W/ BIOPSIES  2011   SEVERAL    CRANIOTOMY     CYSTECTOMY  1982   ESOPHAGOGASTRODUODENOSCOPY  2012   frontal meningioma resected  1998   left shoulder dislocation surgery  2005   POLYPECTOMY  2016   HPP polypoid   TUBAL LIGATION     WISDOM TOOTH EXTRACTION     WRIST SURGERY  04/21/2017   rt-cyst removal      Family History  Problem Relation Age of Onset   Colon cancer Father 5   Hyperlipidemia Father    Coronary artery disease Father 71       MI   Diabetes Father        elderly   Colon polyps Father    Heart attack Father 35   Colon cancer Paternal Aunt 87       and again at age 42   Colon polyps Paternal Aunt 34   Colon cancer Paternal Uncle 93   Colon polyps Paternal Uncle 72   Other Brother        transient global amnesia    Colon polyps Brother 63   Crohn's disease Daughter    Coronary artery disease Maternal Uncle 74   Colon cancer Mother 101   Colon polyps Mother 67   Lung cancer Maternal Grandmother    Colon cancer Paternal Grandmother 97   Colon polyps Paternal Grandmother 97   Deep vein thrombosis Paternal Grandfather    Mitral valve prolapse Brother    Breast cancer Neg Hx    Stomach cancer Neg Hx    Rectal cancer Neg Hx    Esophageal cancer Neg Hx     Social History   Tobacco Use   Smoking status: Former    Packs/day: 1.50    Types: Cigarettes    Quit date: 05/22/1990    Years since quitting: 31.7   Smokeless tobacco: Never  Vaping Use   Vaping Use: Never used  Substance Use Topics   Alcohol use: Yes    Comment: socially   Drug use: No    Review of Systems:   Review of Systems  Constitutional:  Negative for chills, fever, malaise/fatigue and weight loss.  HENT:  Negative for hearing loss, sinus pain and sore throat.   Respiratory:  Negative for cough and hemoptysis.   Cardiovascular:  Negative for chest pain, palpitations, leg swelling and PND.  Gastrointestinal:  Negative for abdominal pain, constipation, diarrhea, heartburn, nausea and vomiting.  Genitourinary:  Negative for dysuria, frequency and urgency.  Musculoskeletal:  Negative for back pain, myalgias and neck pain.  Skin:  Negative for itching and rash.  Neurological:  Negative for dizziness, tingling, seizures and headaches.  Endo/Heme/Allergies:  Negative for polydipsia.  Psychiatric/Behavioral:  Negative for depression. The patient is not nervous/anxious.     Objective:   BP 110/76 (BP Location: Left Arm, Patient Position: Sitting, Cuff Size: Large)   Pulse 78   Temp 97.6 F (36.4 C) (Temporal)   Ht _0  (1.651 m)   Wt 270 lb 4 oz (122.6 kg)   LMP 12/21/2010   SpO2 96%   BMI 44.97 kg/m  Body mass index is 44.97 kg/m.   General Appearance:    Alert, cooperative, no distress, appears stated age  Head:     Normocephalic, without obvious abnormality, atraumatic  Eyes:    PERRL, conjunctiva/corneas clear, EOM's intact, fundi    benign, both eyes  Ears:    Normal TM's and external ear canals, both ears  Nose:   Nares  normal, septum midline, mucosa normal, no drainage    or sinus tenderness  Throat:   Lips, mucosa, and tongue normal; teeth and gums normal  Neck:   Supple, symmetrical, trachea midline, no adenopathy;    thyroid:  no enlargement/tenderness/nodules; no carotid   bruit or JVD  Back:     Symmetric, no curvature, ROM normal, no CVA tenderness  Lungs:     Clear to auscultation bilaterally, respirations unlabored  Chest Wall:    No tenderness or deformity   Heart:    Regular rate and rhythm, S1 and S2 normal, no murmur, rub or gallop  Breast Exam:    Deferred  Abdomen:     Soft, non-tender, bowel sounds active all four quadrants,    no masses, no organomegaly  Genitalia:    Deferred  Extremities:   Extremities normal, atraumatic, no cyanosis or edema  Pulses:   2+ and symmetric all extremities  Skin:   Skin color, texture, turgor normal, no rashes or lesions  Lymph nodes:   Cervical, supraclavicular, and axillary nodes normal  Neurologic:   CNII-XII intact, normal strength, sensation and reflexes    throughout    Assessment/Plan:   Routine physical examination Today patient counseled on age appropriate routine health concerns for screening and prevention, each reviewed and up to date or declined. Immunizations reviewed and up to date or declined. Labs ordered and reviewed. Risk factors for depression reviewed and negative. Hearing function and visual acuity are intact. ADLs screened and addressed as needed. Functional ability and level of safety reviewed and appropriate. Education, counseling and referrals performed based on assessed risks today. Patient provided with a copy of personalized plan for preventive services.   Morbid obesity ([place]) Exercising and eating healthy Will  trial Wegovy 0.25 mg weekly Follow-up in 3 months, sooner if concerns   Vitamin D deficiency Update Vit D and provide recommendations accordingly   B12 deficiency Update B12 and provide recommendations accordingly   Pure hypercholesterolemia Update lipid panel and make recommendations accordingly   Meningioma ([place]), followed by Dr. Sherwood Gambler given phenobarbital for post surgery seizures, controlled Well controlled per patient   Need for prophylactic vaccination with combined diphtheria-tetanus-pertussis (DTP) vaccine Updated today   Need for immunization against influenza Updated today   Patient Counseling: _0    Nutrition: Stressed importance of moderation in sodium/caffeine intake, saturated fat and cholesterol, caloric balance, sufficient intake of fresh fruits, vegetables, fiber, calcium, iron, and 1 mg of folate supplement per day (for females capable of pregnancy).  _1    Stressed the importance of regular exercise.   _2    Substance Abuse: Discussed cessation/primary prevention of tobacco, alcohol, or other drug use; driving or other dangerous activities under the influence; availability of treatment for abuse.   _3    Injury prevention: Discussed safety belts, safety helmets, smoke detector, smoking near bedding or upholstery.   _4    Sexuality: Discussed sexually transmitted diseases, partner selection, use of condoms, avoidance of unintended pregnancy  and contraceptive alternatives.  _5    Dental health: Discussed importance of regular tooth brushing, flossing, and dental visits.  _6    Health maintenance and immunizations reviewed. Please refer to Health maintenance section.   Inda Coke, PA-C [place]

## 2022-02-02 NOTE — Patient Instructions (Signed)
It was great to see you!  We will send in [facility] -- this may take a few weeks to get to you.  Please go to the lab for blood work.   Our office will call you with your results unless you have chosen to receive results via MyChart.  If your blood work is normal we will follow-up each year for physicals and as scheduled for chronic medical problems.  If anything is abnormal we will treat accordingly and get you in for a follow-up.  Take care,  Aldona Bar

## 2022-02-13 ENCOUNTER — Encounter: Payer: Self-pay | Admitting: Physician Assistant

## 2022-02-13 DIAGNOSIS — E7800 Pure hypercholesterolemia, unspecified: Secondary | ICD-10-CM

## 2022-02-13 NOTE — Telephone Encounter (Signed)
Spoke to pt told her I contacted [facility] and they have not been able to get Wegovy 0.25 mg low dose for over two months. Also they said your [facility] insurance card did not go through. Told pt I can send Wegovy to another pharmacy if she wants to see if she can get it, if your allowed to use other pharmacy's. Pt said she is not sure. Told pt to contact her insurance to see if Rx can be sent to another pharmacy since hers can not get it. Also check to see if they even cover [facility] and let me know. Pt verbalized understanding and will get back to me.

## 2022-02-14 ENCOUNTER — Other Ambulatory Visit (HOSPITAL_BASED_OUTPATIENT_CLINIC_OR_DEPARTMENT_OTHER): Payer: Self-pay

## 2022-02-14 MED ORDER — WEGOVY 0.25 MG/0.5ML ~~LOC~~ SOAJ
0.2500 mg | SUBCUTANEOUS | 1 refills | Status: DC
  Filled 2022-02-14: qty 2, 28d supply, fill #0

## 2022-02-17 ENCOUNTER — Telehealth: Payer: Self-pay | Admitting: *Deleted

## 2022-02-17 NOTE — Telephone Encounter (Signed)
Received fax from pharmacy PA needed for Wegovy 0.25 mg, PA done thru Covermymeds. Received response from [facility] thru [facility] has been denied medication not covered by plan.

## 2022-02-17 NOTE — Telephone Encounter (Signed)
Spoke to pt told her PA for Mancel Parsons has been denied by your insurance due to not covered by your plan. Pt verbalized understanding and asked if anything else is out there? Told her the only thing might be Phentermine but I do not know if Aldona Bar will prescribe it and all other medications are not covered. Pt verbalized understanding.

## 2022-02-19 ENCOUNTER — Other Ambulatory Visit (HOSPITAL_BASED_OUTPATIENT_CLINIC_OR_DEPARTMENT_OTHER): Payer: Self-pay

## 2022-02-20 ENCOUNTER — Other Ambulatory Visit (HOSPITAL_BASED_OUTPATIENT_CLINIC_OR_DEPARTMENT_OTHER): Payer: Self-pay

## 2022-02-20 NOTE — Addendum Note (Signed)
Addended by: Marian Sorrow on: 02/20/2022 04:14 PM   Modules accepted: Orders

## 2022-02-20 NOTE — Telephone Encounter (Addendum)
Tricia [name], see message and advise if there is something else pt can try? Tricia [name] has been denied.

## 2022-02-21 ENCOUNTER — Encounter: Payer: Self-pay | Admitting: Physician Assistant

## 2022-02-21 ENCOUNTER — Other Ambulatory Visit (HOSPITAL_BASED_OUTPATIENT_CLINIC_OR_DEPARTMENT_OTHER): Payer: Self-pay

## 2022-03-04 ENCOUNTER — Other Ambulatory Visit (HOSPITAL_BASED_OUTPATIENT_CLINIC_OR_DEPARTMENT_OTHER): Payer: Self-pay

## 2022-03-04 ENCOUNTER — Encounter (HOSPITAL_BASED_OUTPATIENT_CLINIC_OR_DEPARTMENT_OTHER): Payer: Self-pay | Admitting: Pharmacist

## 2022-03-20 ENCOUNTER — Other Ambulatory Visit: Payer: Self-pay | Admitting: Physician Assistant

## 2022-03-20 DIAGNOSIS — F5101 Primary insomnia: Secondary | ICD-10-CM

## 2022-03-20 MED ORDER — TRAZODONE HCL 50 MG PO TABS: ORAL_TABLET | ORAL | 0 refills | Status: DC

## 2022-05-16 ENCOUNTER — Encounter: Payer: Self-pay | Admitting: Physician Assistant

## 2022-06-23 ENCOUNTER — Other Ambulatory Visit: Payer: Self-pay | Admitting: Physician Assistant

## 2022-06-23 DIAGNOSIS — F5101 Primary insomnia: Secondary | ICD-10-CM

## 2022-06-26 ENCOUNTER — Other Ambulatory Visit: Payer: Self-pay | Admitting: Physician Assistant

## 2022-06-26 DIAGNOSIS — F5101 Primary insomnia: Secondary | ICD-10-CM

## 2022-06-26 MED ORDER — TRAZODONE HCL 50 MG PO TABS: ORAL_TABLET | ORAL | 0 refills | Status: DC

## 2022-07-14 DIAGNOSIS — D496 Neoplasm of unspecified behavior of brain: Secondary | ICD-10-CM | POA: Diagnosis not present

## 2022-07-20 ENCOUNTER — Encounter: Payer: Self-pay | Admitting: Physician Assistant

## 2022-07-20 ENCOUNTER — Encounter: Payer: Self-pay | Admitting: Family Medicine

## 2022-07-20 ENCOUNTER — Telehealth (INDEPENDENT_AMBULATORY_CARE_PROVIDER_SITE_OTHER): Payer: BC Managed Care – PPO | Admitting: Family Medicine

## 2022-07-20 VITALS — Ht 65.0 in | Wt 215.0 lb

## 2022-07-20 DIAGNOSIS — J0100 Acute maxillary sinusitis, unspecified: Secondary | ICD-10-CM | POA: Diagnosis not present

## 2022-07-20 MED ORDER — AMOXICILLIN 875 MG PO TABS: 875.0000 mg | ORAL_TABLET | Freq: Two times a day (BID) | ORAL | 0 refills | Status: AC

## 2022-07-20 NOTE — Progress Notes (Signed)
Virtual Visit via Video   I connected with patient on 07/20/22 at  8:40 AM EST by a video enabled telemedicine application and verified that I am speaking with the correct person using two identifiers.  Location patient: Home Location provider: [facility], Office Persons participating in the virtual visit: Patient, Provider, [place] Marcille Blanco C)  I discussed the limitations of evaluation and management by telemedicine and the availability of in person appointments. The patient expressed understanding and agreed to proceed.  Subjective:   HPI:   URI- sxs started Tuesday w/ 'scratchy throat'.  Reports this occurs 1-2x/year as weather changes.  Now w/ nasal congestion, sore throat, sneezing, ear congestion.  + maxillary sinus pressure.  No tooth pain.  + HA.  Subjective fever.  Pt has tried Mucinex D and Zyrtec w/o relief.  Currently hoping for medication as mom is actively dying.    ROS:   See pertinent positives and negatives per HPI.  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 Sherwood Gambler given phenobarbital for post surgery seizures, controlled    Migraine headache, with aura, related to menses, Imitrex abortive    Vitamin D deficiency, taking  daily OTC vitamin D 12/24/2007   History of colonic polyps, with polypectomy 2006, 2011 02/27/2007    Social History   Tobacco Use   Smoking status: Former    Packs/day: 1.50    Types: Cigarettes    Quit date: 05/22/1990    Years since quitting: 32.1   Smokeless tobacco: Never  Substance Use Topics   Alcohol use: Yes    Comment: socially    Current Outpatient Medications:    amoxicillin (AMOXIL) 875 MG tablet, Take 1 tablet (875 mg total) by mouth 2 (two) times daily for 10 days., Disp: 20 tablet, Rfl: 0   B Complex-Folic Acid (B COMPLEX MAXI PO), Take 1 tablet by mouth daily in the afternoon., Disp: , Rfl:    cyanocobalamin (VITAMIN B12) 1000 MCG tablet, Take 1,000 mcg by mouth daily., Disp: , Rfl:    Multiple Vitamins-Minerals (CENTRUM SILVER 50+WOMEN) TABS, Take 1 tablet by mouth daily., Disp: , Rfl:    PHENobarbital (LUMINAL) 97.2 MG tablet, Take 97.2 mg by mouth at bedtime., Disp: , Rfl:    SUMAtriptan (IMITREX) 100 MG tablet, Take 1 tablet (100 mg total) by mouth once for 1 dose. May repeat in 2 hours if headache persists or recurs., Disp: 10 tablet, Rfl: 2   traZODone (DESYREL) 50 MG tablet, TAKE 0.5-1 TABLETS BY MOUTH AT BEDTIME AS NEEDED FOR SLEEP., Disp: 90 tablet, Rfl: 0   Vitamin D, Cholecalciferol, 25 MCG (1000 UT) TABS, Take 1 tablet by mouth daily., Disp: , Rfl:    Semaglutide-Weight Management (WEGOVY) 0.25 MG/0.5ML SOAJ, Inject 0.25 mg into the skin once  a week. (Patient not taking: Reported on 07/20/2022), Disp: 2 mL, Rfl: 1  Allergies  Allergen Reactions   Codeine Sulfate     REACTION: unspecified   Phenytoin Sodium Extended Rash    Objective:   Ht '5\' 5"'$  (1.651 m)   Wt 215 lb (97.5 kg)   LMP 12/21/2010   BMI 35.78 kg/m  AAOx3, NAD NCAT, EOMI No obvious CN deficits Coloring WNL Pt is able to speak clearly, coherently without shortness of breath or increased work of breathing.  Thought process is linear.  Mood is appropriate.   Assessment and Plan:    Maxillary sinusitis- new.  Pt reports she gets similar sxs 1-2x/year.  Asking for antibiotics so she can go be w/ mother who is actively dying.  Discussed that her sxs are most likely viral- meaning the antibiotic won't be effective and it will need to just run its course- but that I'm happy to send in the antibiotics given her situation.  Reviewed supportive care and red flags that should prompt return.  Pt expressed understanding and is in agreement w/ plan.    Annye Asa, MD 07/20/2022

## 2022-08-31 ENCOUNTER — Other Ambulatory Visit: Payer: Self-pay | Admitting: Obstetrics and Gynecology

## 2022-08-31 DIAGNOSIS — Z1231 Encounter for screening mammogram for malignant neoplasm of breast: Secondary | ICD-10-CM

## 2022-09-22 ENCOUNTER — Other Ambulatory Visit: Payer: Self-pay | Admitting: Physician Assistant

## 2022-09-22 DIAGNOSIS — F5101 Primary insomnia: Secondary | ICD-10-CM

## 2022-09-22 MED ORDER — TRAZODONE HCL 50 MG PO TABS: ORAL_TABLET | ORAL | 1 refills | Status: DC

## 2022-11-06 ENCOUNTER — Ambulatory Visit
Admission: RE | Admit: 2022-11-06 | Discharge: 2022-11-06 | Disposition: A | Discharge status: Home / Self Care | Payer: BC Managed Care – PPO | Source: Ambulatory Visit | Attending: Obstetrics and Gynecology | Admitting: Obstetrics and Gynecology

## 2022-11-06 DIAGNOSIS — Z1231 Encounter for screening mammogram for malignant neoplasm of breast: Secondary | ICD-10-CM | POA: Diagnosis not present

## 2022-11-08 ENCOUNTER — Other Ambulatory Visit: Payer: Self-pay | Admitting: Obstetrics and Gynecology

## 2022-11-08 DIAGNOSIS — R922 Inconclusive mammogram: Secondary | ICD-10-CM

## 2022-11-09 ENCOUNTER — Other Ambulatory Visit: Payer: Self-pay | Admitting: Physician Assistant

## 2022-11-09 DIAGNOSIS — R922 Inconclusive mammogram: Secondary | ICD-10-CM

## 2022-11-15 ENCOUNTER — Other Ambulatory Visit: Payer: Self-pay | Admitting: Physician Assistant

## 2022-11-15 ENCOUNTER — Ambulatory Visit
Admission: RE | Admit: 2022-11-15 | Discharge: 2022-11-15 | Disposition: A | Discharge status: Home / Self Care | Payer: BC Managed Care – PPO | Source: Ambulatory Visit | Attending: Obstetrics and Gynecology | Admitting: Obstetrics and Gynecology

## 2022-11-15 DIAGNOSIS — R921 Mammographic calcification found on diagnostic imaging of breast: Secondary | ICD-10-CM | POA: Diagnosis not present

## 2022-11-15 DIAGNOSIS — R922 Inconclusive mammogram: Secondary | ICD-10-CM

## 2022-11-24 ENCOUNTER — Ambulatory Visit
Admission: RE | Admit: 2022-11-24 | Discharge: 2022-11-24 | Disposition: A | Discharge status: Home / Self Care | Payer: BC Managed Care – PPO | Source: Ambulatory Visit | Attending: Physician Assistant | Admitting: Physician Assistant

## 2022-11-24 DIAGNOSIS — N6011 Diffuse cystic mastopathy of right breast: Secondary | ICD-10-CM | POA: Diagnosis not present

## 2022-11-24 DIAGNOSIS — R921 Mammographic calcification found on diagnostic imaging of breast: Secondary | ICD-10-CM | POA: Diagnosis not present

## 2022-11-24 DIAGNOSIS — R922 Inconclusive mammogram: Secondary | ICD-10-CM

## 2022-11-24 HISTORY — PX: BREAST BIOPSY: SHX20

## 2022-11-28 ENCOUNTER — Other Ambulatory Visit: Payer: Self-pay | Admitting: Physician Assistant

## 2022-11-28 DIAGNOSIS — N6459 Other signs and symptoms in breast: Secondary | ICD-10-CM

## 2022-11-30 DIAGNOSIS — L821 Other seborrheic keratosis: Secondary | ICD-10-CM | POA: Diagnosis not present

## 2022-11-30 DIAGNOSIS — L578 Other skin changes due to chronic exposure to nonionizing radiation: Secondary | ICD-10-CM | POA: Diagnosis not present

## 2022-11-30 DIAGNOSIS — D1801 Hemangioma of skin and subcutaneous tissue: Secondary | ICD-10-CM | POA: Diagnosis not present

## 2022-11-30 DIAGNOSIS — L814 Other melanin hyperpigmentation: Secondary | ICD-10-CM | POA: Diagnosis not present

## 2022-12-01 ENCOUNTER — Telehealth: Payer: Self-pay | Admitting: Physician Assistant

## 2022-12-01 ENCOUNTER — Other Ambulatory Visit: Payer: Self-pay | Admitting: Physician Assistant

## 2022-12-01 ENCOUNTER — Ambulatory Visit
Admission: RE | Admit: 2022-12-01 | Discharge: 2022-12-01 | Disposition: A | Discharge status: Home / Self Care | Payer: BC Managed Care – PPO | Source: Ambulatory Visit | Attending: Physician Assistant | Admitting: Physician Assistant

## 2022-12-01 DIAGNOSIS — N6459 Other signs and symptoms in breast: Secondary | ICD-10-CM

## 2022-12-01 DIAGNOSIS — N6453 Retraction of nipple: Secondary | ICD-10-CM | POA: Diagnosis not present

## 2022-12-01 NOTE — Telephone Encounter (Signed)
Samantha, please see message. 

## 2022-12-01 NOTE — Telephone Encounter (Signed)
Caller is Bronson Ing from [employer]. States pt is there for Korea of breast and they aren't able to do this until provider signs off on order. Requests this be done ASAP.

## 2023-01-02 DIAGNOSIS — D3131 Benign neoplasm of right choroid: Secondary | ICD-10-CM | POA: Diagnosis not present

## 2023-01-02 DIAGNOSIS — H52223 Regular astigmatism, bilateral: Secondary | ICD-10-CM | POA: Diagnosis not present

## 2023-01-02 DIAGNOSIS — H5203 Hypermetropia, bilateral: Secondary | ICD-10-CM | POA: Diagnosis not present

## 2023-01-02 DIAGNOSIS — H2512 Age-related nuclear cataract, left eye: Secondary | ICD-10-CM | POA: Diagnosis not present

## 2023-01-02 DIAGNOSIS — H25811 Combined forms of age-related cataract, right eye: Secondary | ICD-10-CM | POA: Diagnosis not present

## 2023-01-02 DIAGNOSIS — H04123 Dry eye syndrome of bilateral lacrimal glands: Secondary | ICD-10-CM | POA: Diagnosis not present

## 2023-01-02 DIAGNOSIS — H524 Presbyopia: Secondary | ICD-10-CM | POA: Diagnosis not present

## 2023-01-04 ENCOUNTER — Encounter (INDEPENDENT_AMBULATORY_CARE_PROVIDER_SITE_OTHER): Payer: Self-pay

## 2023-01-31 NOTE — Progress Notes (Signed)
Subjective:    Tricia [name] is a 62 y.o. female and is here for a comprehensive physical exam.  HPI  There are no preventive care reminders to display for this patient.  Acute Concerns: None  Chronic Issues Migraine Headaches Treated with sumatriptan 100 mg as needed. She has only used this twice in 2024.  Insomnia Treated with trazodone 25 - 50 mg at bedtime She will sometimes take an OTC (available over the counter without a prescription) sleep aid with this as well and it does not cause any adverse effects This is helping symptoms without negative side effects.  Obesity We had prescribed Mounjaro in the past when manufacturer's coupons came out for medication She has two boxes at home in her refrigerator that she has yet to take - she is unsure of dose and expiration She is ready to start this medication or another GLP-1 She has no family history of thyroid cancer and has overall good tolerance to medications in the past  Seizure history Remains on phenobarbital 97.2 mg nightly Tolerating well and has remained seizure-free Has been told she can stop medication however she is reluctant to do so  Labs are pending. Takes Vitamin D, Vitamin B12 supplements, multivitamin. She has seen a dermatologist this year and denies new or concerning skin problems. Seen by Willodean Rosenthal for gyn care. Denies GI symptoms.  Health Maintenance: Immunizations -- UTD on tetanus, shingles vaccines. Flu vaccine administered today. Colonoscopy -- Last completed 08/11/20. Showed one less than 1 mm polyp in cecum, three 4-6 mm polyps in transverse colon, diverticulosis in sigmoid, descending, transverse colon, non-bleeding internal hemorrhoids. Pathology showed one tubular adenoma. Recommended repeat in 2027 (7 years) Mammogram -- Last completed 11/08/22. Showed calcifications in the right breast. Follow-up right mammogram showed suspicious group of calcifications in central retroareolar  right breast spanning 1.5 cm. Tissue biopsy was benign. PAP -- Normal pap smear on 02/10/21. Bone Density -- Not completed. Discussed adding this on to upcoming mammogram in 2025 when she is 63. Diet -- Appetite has been inconsistent. She is working on portion control. Exercise -- She has been walking.  Sleep habits -- Stable Mood -- Stable  UTD with dentist? - Yes UTD with eye doctor? - Yes  Weight history: Wt Readings from Last 10 Encounters:  02/07/23 275 lb 6.1 oz (124.9 kg)  07/20/22 215 lb (97.5 kg)  02/02/22 270 lb 4 oz (122.6 kg)  01/31/21 265 lb 6.4 oz (120.4 kg)  08/11/20 246 lb (111.6 kg)  06/04/20 246 lb (111.6 kg)  11/18/19 246 lb 4 oz (111.7 kg)  06/23/19 237 lb 4 oz (107.6 kg)  03/14/19 235 lb 6.4 oz (106.8 kg)  12/11/18 232 lb (105.2 kg)   Body mass index is 45.83 kg/m. Patient's last menstrual period was 12/21/2010.  Alcohol use:  reports current alcohol use.  Tobacco use:  Tobacco Use: Medium Risk (02/07/2023)   Patient History    Smoking Tobacco Use: Former    Smokeless Tobacco Use: Never    Passive Exposure: Not on file   Eligible for lung cancer screening? no     02/07/2023    8:06 AM  Depression screen PHQ 2/9  Decreased Interest 1  Down, Depressed, Hopeless 1  PHQ - 2 Score 2  Altered sleeping 1  Tired, decreased energy 2  Change in appetite 2  Feeling bad or failure about yourself  0  Trouble concentrating 0  Moving slowly or fidgety/restless 0  Suicidal thoughts 0  PHQ-9 Score 7  Difficult doing work/chores Not difficult at all     Other providers/specialists: Patient Care Team: Jarold Motto, [place] as PCP - General (Physician Assistant) Shirlean Kelly, MD (Neurosurgery) Louis Meckel, MD (Inactive) (Gastroenterology) Tracey Harries, MD (Obstetrics and Gynecology) Leta Speller, MD as Attending Physician (Dermatology) Rodell Perna, NP (Inactive) as Nurse Practitioner (Obstetrics and Gynecology)    PMHx, SurgHx,  SocialHx, Medications, and Allergies were reviewed in the Visit Navigator and updated as appropriate.   Past Medical History:  Diagnosis Date   Allergy    seasonal allergies   Cataract    not a surgical candidate at this time (06/04/2020)   Colon polyps 2006, 2011   TUBULAR ADENOMA AND HYPERPLASTIC POLYP   Esophageal stricture 2012   GERD (gastroesophageal reflux disease)    with certain foods   Glucosuria    with normal blood sugar   Hepatitis C aby neg viral copies    serology positive but neg viral copies    HLD (hyperlipidemia)    diet controlled   Jaundice due to hepatitis    hx when younger unknown cause resolved    Meningioma (HCC)    Migraine headache    some aura  period related  imitrex helps   MVP (mitral valve prolapse)    echo 02 normal   Seizures (HCC)    last noted seizure 1998-current on med to control   Ulcer 2012     Past Surgical History:  Procedure Laterality Date   APPENDECTOMY     BREAST BIOPSY Right 11/24/2022   MM RT BREAST BX W LOC DEV 1ST LESION IMAGE BX SPEC STEREO GUIDE 11/24/2022 [facility]   BUNIONECTOMY     laft   CHOLECYSTECTOMY  2015   Dr Michaell Cowing   CIN D&C conization  2000   COLONOSCOPY  2016   KN-MAC-prep good (Suprep), HPP polypoid   COLONOSCOPY W/ BIOPSIES  2011   SEVERAL    CRANIOTOMY     CYSTECTOMY  1982   ESOPHAGOGASTRODUODENOSCOPY  2012   frontal meningioma resected  1998   left shoulder dislocation surgery  2005   POLYPECTOMY  2016   HPP polypoid   TUBAL LIGATION     WISDOM TOOTH EXTRACTION     WRIST SURGERY  04/21/2017   rt-cyst removal      Family History  Problem Relation Age of Onset   Colon cancer Mother 38   Colon polyps Mother 44   Lung cancer Mother    Leukemia Mother    Cancer Mother        gynecological cancer   Colon cancer Father 54   Hyperlipidemia Father    Coronary artery disease Father 38       MI   Diabetes Father        elderly   Colon polyps Father    Heart attack Father 58    Other Brother        transient global amnesia   Colon polyps Brother 62   Mitral valve prolapse Brother    Lung cancer Maternal Grandmother    Colon cancer Paternal Grandmother 64   Colon polyps Paternal Grandmother 46   Deep vein thrombosis Paternal Grandfather    Crohn's disease Daughter    Coronary artery disease Maternal Uncle 32   Colon cancer Paternal Aunt 75       and again at age 68   Colon polyps Paternal Aunt 69   Colon cancer Paternal Uncle 35  Colon polyps Paternal Uncle 10   Breast cancer Neg Hx    Stomach cancer Neg Hx    Rectal cancer Neg Hx    Esophageal cancer Neg Hx     Social History   Tobacco Use   Smoking status: Former    Current packs/day: 0.00    Types: Cigarettes    Quit date: 05/22/1973    Years since quitting: 49.7   Smokeless tobacco: Never  Vaping Use   Vaping status: Never Used  Substance Use Topics   Alcohol use: Yes    Comment: socially   Drug use: No    Review of Systems:   Review of Systems  Constitutional:  Negative for chills, fever, malaise/fatigue and weight loss.  HENT:  Negative for hearing loss, sinus pain and sore throat.   Respiratory:  Negative for cough, hemoptysis and shortness of breath.   Cardiovascular:  Negative for chest pain, palpitations and PND.  Gastrointestinal:  Negative for abdominal pain, constipation, diarrhea, heartburn, nausea and vomiting.  Genitourinary:  Negative for dysuria, frequency and urgency.  Musculoskeletal:  Negative for back pain, myalgias and neck pain.  Skin:  Negative for itching and rash.  Neurological:  Negative for dizziness, seizures and headaches.  Endo/Heme/Allergies:  Negative for polydipsia.  Psychiatric/Behavioral:  Negative for depression. The patient is not nervous/anxious.     Objective:   BP (!) 150/90 (BP Location: Left Arm, Patient Position: Sitting, Cuff Size: Large)   Pulse 77   Temp 98 F (36.7 C) (Temporal)   Ht 5\' 5"  (1.651 m)   Wt 275 lb 6.1 oz (124.9 kg)    LMP 12/21/2010   SpO2 96%   BMI 45.83 kg/m  Body mass index is 45.83 kg/m.   General Appearance:    Alert, cooperative, no distress, appears stated age  Head:    Normocephalic, without obvious abnormality, atraumatic  Eyes:    PERRL, conjunctiva/corneas clear, EOM's intact, fundi    benign, both eyes  Ears:    Normal TM's and external ear canals, both ears  Nose:   Nares normal, septum midline, mucosa normal, no drainage    or sinus tenderness  Throat:   Lips, mucosa, and tongue normal; teeth and gums normal  Neck:   Supple, symmetrical, trachea midline, no adenopathy;    thyroid:  no enlargement/tenderness/nodules; no carotid   bruit or JVD  Back:     Symmetric, no curvature, ROM normal, no CVA tenderness  Lungs:     Clear to auscultation bilaterally, respirations unlabored  Chest Wall:    No tenderness or deformity   Heart:    Regular rate and rhythm, S1 and S2 normal, no murmur, rub or gallop  Breast Exam:    Deferred  Abdomen:     Soft, non-tender, bowel sounds active all four quadrants,    no masses, no organomegaly  Genitalia:    Deferred  Extremities:   Extremities normal, atraumatic, no cyanosis or edema  Pulses:   2+ and symmetric all extremities  Skin:   Skin color, texture, turgor normal, no rashes or lesions  Lymph nodes:   Cervical, supraclavicular, and axillary nodes normal  Neurologic:   CNII-XII intact, normal strength, sensation and reflexes    throughout    Assessment/Plan:   Routine physical examination Today patient counseled on age appropriate routine health concerns for screening and prevention, each reviewed and up to date or declined. Immunizations reviewed and up to date or declined. Labs ordered and reviewed. Risk factors for depression  reviewed and negative. Hearing function and visual acuity are intact. ADLs screened and addressed as needed. Functional ability and level of safety reviewed and appropriate. Education, counseling and referrals performed  based on assessed risks today. Patient provided with a copy of personalized plan for preventive services.  Need for immunization against influenza Completed today  Seizure disorder (HCC) Well controlled with current phenobarbital regimen  Morbid obesity (HCC) Ongoing Recommend that she reach out to me to let me know what dosage of medication she has at home and what the expiration date is Will go ahead and send in Zepbound 5 mg weekly for her to see if we have insurance approval for this medication Follow-up based on insurance coverage, ability to start/tolerate medication Continue efforts at healthy diet and exercise  Pure hypercholesterolemia Update lipid panel and make recommendations accordingly  Vitamin D deficiency Update vitamin D  B12 deficiency Update B12  Estrogen deficiency Discussed plan to obtain DEXA when 63 -- she would [facility] of [facility] -- I will go ahead and order this now for her  Primary insomnia Well controlled with 25-50 mg trazodone as needed   I,Alexander Ruley,acting as a scribe for [employer], PA.,have documented all relevant documentation on the behalf of Jarold Motto, PA,as directed by  Jarold Motto, PA while in the presence of Jarold Motto, [place].  I, Jarold Motto, [place], have reviewed all documentation for this visit. The documentation on 02/07/23 for the exam, diagnosis, procedures, and orders are all accurate and complete.  Jarold Motto, PA-C [facility]

## 2023-02-07 ENCOUNTER — Ambulatory Visit: Payer: BC Managed Care – PPO | Admitting: Physician Assistant

## 2023-02-07 ENCOUNTER — Encounter: Payer: Self-pay | Admitting: Physician Assistant

## 2023-02-07 VITALS — BP 150/90 | HR 77 | Temp 98.0°F | Ht 65.0 in | Wt 275.4 lb

## 2023-02-07 DIAGNOSIS — E2839 Other primary ovarian failure: Secondary | ICD-10-CM

## 2023-02-07 DIAGNOSIS — Z0000 Encounter for general adult medical examination without abnormal findings: Secondary | ICD-10-CM

## 2023-02-07 DIAGNOSIS — E7800 Pure hypercholesterolemia, unspecified: Secondary | ICD-10-CM | POA: Diagnosis not present

## 2023-02-07 DIAGNOSIS — E559 Vitamin D deficiency, unspecified: Secondary | ICD-10-CM | POA: Diagnosis not present

## 2023-02-07 DIAGNOSIS — Z23 Encounter for immunization: Secondary | ICD-10-CM

## 2023-02-07 DIAGNOSIS — G40909 Epilepsy, unspecified, not intractable, without status epilepticus: Secondary | ICD-10-CM | POA: Diagnosis not present

## 2023-02-07 DIAGNOSIS — E538 Deficiency of other specified B group vitamins: Secondary | ICD-10-CM

## 2023-02-07 DIAGNOSIS — F5101 Primary insomnia: Secondary | ICD-10-CM

## 2023-02-07 LAB — CBC WITH DIFFERENTIAL/PLATELET
Basophils Absolute: 0.1 10*3/uL (ref 0.0–0.1)
Basophils Relative: 1.9 % (ref 0.0–3.0)
Eosinophils Absolute: 0.6 10*3/uL (ref 0.0–0.7)
Eosinophils Relative: 8.7 % — ABNORMAL HIGH (ref 0.0–5.0)
HCT: 43.5 % (ref 36.0–46.0)
Hemoglobin: 13.9 g/dL (ref 12.0–15.0)
Lymphocytes Relative: 20.2 % (ref 12.0–46.0)
Lymphs Abs: 1.3 10*3/uL (ref 0.7–4.0)
MCHC: 31.9 g/dL (ref 30.0–36.0)
MCV: 89.2 fl (ref 78.0–100.0)
Monocytes Absolute: 0.5 10*3/uL (ref 0.1–1.0)
Monocytes Relative: 7.4 % (ref 3.0–12.0)
Neutro Abs: 3.9 10*3/uL (ref 1.4–7.7)
Neutrophils Relative %: 61.8 % (ref 43.0–77.0)
Platelets: 237 10*3/uL (ref 150.0–400.0)
RBC: 4.87 Mil/uL (ref 3.87–5.11)
RDW: 13.6 % (ref 11.5–15.5)
WBC: 6.3 10*3/uL (ref 4.0–10.5)

## 2023-02-07 LAB — COMPREHENSIVE METABOLIC PANEL WITH GFR
ALT: 22 U/L (ref 0–35)
AST: 18 U/L (ref 0–37)
Albumin: 4.1 g/dL (ref 3.5–5.2)
Alkaline Phosphatase: 78 U/L (ref 39–117)
BUN: 16 mg/dL (ref 6–23)
CO2: 27 meq/L (ref 19–32)
Calcium: 8.6 mg/dL (ref 8.4–10.5)
Chloride: 106 meq/L (ref 96–112)
Creatinine, Ser: 0.88 mg/dL (ref 0.40–1.20)
GFR: 69.37 mL/min (ref >60.00)
Glucose, Bld: 89 mg/dL (ref 70–99)
Potassium: 4.1 meq/L (ref 3.5–5.1)
Sodium: 142 meq/L (ref 135–145)
Total Bilirubin: 0.2 mg/dL (ref 0.2–1.2)
Total Protein: 6.8 g/dL (ref 6.0–8.3)

## 2023-02-07 LAB — LIPID PANEL
Cholesterol: 213 mg/dL — ABNORMAL HIGH (ref 0–200)
HDL: 61.7 mg/dL (ref >39.00)
LDL Cholesterol: 123 mg/dL — ABNORMAL HIGH (ref 0–99)
NonHDL: 150.85
Total CHOL/HDL Ratio: 3
Triglycerides: 137 mg/dL (ref 0.0–149.0)
VLDL: 27.4 mg/dL (ref 0.0–40.0)

## 2023-02-07 LAB — VITAMIN B12: Vitamin B-12: 1144 pg/mL — ABNORMAL HIGH (ref 211–911)

## 2023-02-07 LAB — VITAMIN D 25 HYDROXY (VIT D DEFICIENCY, FRACTURES): VITD: 45.15 ng/mL (ref 30.00–100.00)

## 2023-02-07 MED ORDER — TIRZEPATIDE-WEIGHT MANAGEMENT 5 MG/0.5ML ~~LOC~~ SOLN: 5.0000 mg | SUBCUTANEOUS | 1 refills | Status: DC

## 2023-02-07 NOTE — Patient Instructions (Addendum)
It was great to see you!  -See Genetic counselor handout - message me if you would like this ordered -I've ordered bone density scan for [place] [facility] to be done at breast center when you do your mammogram -I will send in Zepbound 5 mg to see if your insurance pays for this -- please message me what dose of medication you have in your fridge and expiration date  Please go to the lab for blood work.   Our office will call you with your results unless you have chosen to receive results via MyChart.  If your blood work is normal we will follow-up each year for physicals and as scheduled for chronic medical problems.  If anything is abnormal we will treat accordingly and get you in for a follow-up.  Take care,  Lelon Mast

## 2023-02-08 ENCOUNTER — Encounter: Payer: Self-pay | Admitting: Physician Assistant

## 2023-02-08 ENCOUNTER — Other Ambulatory Visit: Payer: Self-pay | Admitting: Physician Assistant

## 2023-02-08 MED ORDER — ZEPBOUND 2.5 MG/0.5ML ~~LOC~~ SOAJ: 2.5000 mg | SUBCUTANEOUS | 0 refills | Status: DC

## 2023-02-21 ENCOUNTER — Encounter: Payer: Self-pay | Admitting: Physician Assistant

## 2023-02-21 NOTE — Telephone Encounter (Signed)
Please do PA for Zepbound 2.5 mg.

## 2023-02-22 ENCOUNTER — Telehealth: Payer: Self-pay

## 2023-02-22 ENCOUNTER — Other Ambulatory Visit (HOSPITAL_COMMUNITY): Payer: Self-pay

## 2023-02-22 NOTE — Telephone Encounter (Signed)
Pharmacy Patient Advocate Encounter   Received notification from Patient Advice Request messages that prior authorization for Zepbound 2.5MG /0.5ML pen-injectors is required/requested.   Insurance verification completed.   The patient is insured through [facility] [employer] [facility]    Per test claim: PA required; PA started via CoverMyMeds. KEY [id number] . Waiting for clinical questions to populate.

## 2023-02-22 NOTE — Telephone Encounter (Signed)
Clinical questions answered. PA submitted

## 2023-02-23 NOTE — Telephone Encounter (Signed)
Pharmacy Patient Advocate Encounter  Received notification from [facility] that Prior Authorization for  Zepbound 2.5MG /0.5ML pen-injectors  has been DENIED.  Full denial letter will be uploaded to the media tab. See denial reason below.   PA #/Case ID/Reference #: [id number]   Denial reason: Plan exclusion

## 2023-02-23 NOTE — Telephone Encounter (Signed)
Called patient and advised PA for Zepbound denied due to insurance not covering weight loss medications etc. Pt asked how much is cost OOP for medication. Advised she would need to contact her pharmacy and they could give that info. Pt verbalized understanding.

## 2023-02-26 ENCOUNTER — Encounter: Payer: Self-pay | Admitting: Physician Assistant

## 2023-02-27 ENCOUNTER — Other Ambulatory Visit: Payer: Self-pay | Admitting: Physician Assistant

## 2023-02-27 DIAGNOSIS — N904 Leukoplakia of vulva: Secondary | ICD-10-CM | POA: Diagnosis not present

## 2023-02-27 DIAGNOSIS — Z01419 Encounter for gynecological examination (general) (routine) without abnormal findings: Secondary | ICD-10-CM | POA: Diagnosis not present

## 2023-02-27 DIAGNOSIS — Z8041 Family history of malignant neoplasm of ovary: Secondary | ICD-10-CM | POA: Diagnosis not present

## 2023-02-27 MED ORDER — TIRZEPATIDE-WEIGHT MANAGEMENT 2.5 MG/0.5ML ~~LOC~~ SOLN: 2.5000 mg | SUBCUTANEOUS | 0 refills | Status: DC

## 2023-02-28 ENCOUNTER — Other Ambulatory Visit: Payer: Self-pay | Admitting: *Deleted

## 2023-02-28 MED ORDER — TIRZEPATIDE-WEIGHT MANAGEMENT 2.5 MG/0.5ML ~~LOC~~ SOLN: 2.5000 mg | SUBCUTANEOUS | 0 refills | Status: DC

## 2023-03-15 ENCOUNTER — Other Ambulatory Visit: Payer: Self-pay | Admitting: Physician Assistant

## 2023-03-15 DIAGNOSIS — F5101 Primary insomnia: Secondary | ICD-10-CM

## 2023-03-19 ENCOUNTER — Other Ambulatory Visit: Payer: Self-pay | Admitting: Physician Assistant

## 2023-03-19 MED ORDER — TIRZEPATIDE-WEIGHT MANAGEMENT 5 MG/0.5ML ~~LOC~~ SOLN: 5.0000 mg | SUBCUTANEOUS | 5 refills | Status: DC

## 2023-06-07 ENCOUNTER — Telehealth: Payer: BLUE CROSS/BLUE SHIELD | Admitting: Physician Assistant

## 2023-06-07 ENCOUNTER — Telehealth: Payer: Self-pay | Admitting: Physician Assistant

## 2023-06-07 DIAGNOSIS — R6889 Other general symptoms and signs: Secondary | ICD-10-CM

## 2023-06-07 DIAGNOSIS — J101 Influenza due to other identified influenza virus with other respiratory manifestations: Secondary | ICD-10-CM

## 2023-06-07 MED ORDER — ALBUTEROL SULFATE HFA 108 (90 BASE) MCG/ACT IN AERS
1.0000 | INHALATION_SPRAY | Freq: Four times a day (QID) | RESPIRATORY_TRACT | 0 refills | Status: DC | PRN
Start: 2023-06-07 — End: 2023-11-15

## 2023-06-07 MED ORDER — BENZONATATE 100 MG PO CAPS
100.0000 mg | ORAL_CAPSULE | Freq: Three times a day (TID) | ORAL | 0 refills | Status: DC | PRN
Start: 2023-06-07 — End: 2023-11-15

## 2023-06-07 MED ORDER — PROMETHAZINE-DM 6.25-15 MG/5ML PO SYRP
5.0000 mL | ORAL_SOLUTION | Freq: Four times a day (QID) | ORAL | 0 refills | Status: DC | PRN
Start: 2023-06-07 — End: 2023-11-15

## 2023-06-07 NOTE — Patient Instructions (Signed)
Kristeen Mans, thank you for joining [name], PA-C for today's virtual visit.  While this provider is not your primary care provider (PCP), if your PCP is located in our provider database this encounter information will be shared with them immediately following your visit.   A [facility] MyChart account gives you access to today's visit and all your visits, tests, and labs performed at [facility] " click here if you don't have a [facility] MyChart account or go to [email or web address]  Consent: (Patient) Tricia [name] provided verbal consent for this virtual visit at the beginning of the encounter.  Current Medications:  Current Outpatient Medications:    albuterol (VENTOLIN HFA) 108 (90 Base) MCG/ACT inhaler, Inhale 1-2 puffs into the lungs every 6 (six) hours as needed., Disp: 8 g, Rfl: 0   benzonatate (TESSALON) 100 MG capsule, Take 1-2 capsules (100-200 mg total) by mouth 3 (three) times daily as needed., Disp: 30 capsule, Rfl: 0   promethazine-dextromethorphan (PROMETHAZINE-DM) 6.25-15 MG/5ML syrup, Take 5 mLs by mouth 4 (four) times daily as needed., Disp: 118 mL, Rfl: 0   B Complex-Folic Acid (B COMPLEX MAXI PO), Take 1 tablet by mouth daily in the afternoon., Disp: , Rfl:    cyanocobalamin (VITAMIN B12) 1000 MCG tablet, Take 1,000 mcg by mouth daily., Disp: , Rfl:    Multiple Vitamins-Minerals (CENTRUM SILVER 50+WOMEN) TABS, Take 1 tablet by mouth daily., Disp: , Rfl:    PHENobarbital (LUMINAL) 97.2 MG tablet, Take 97.2 mg by mouth at bedtime., Disp: , Rfl:    SUMAtriptan (IMITREX) 100 MG tablet, Take 1 tablet (100 mg total) by mouth once for 1 dose. May repeat in 2 hours if headache persists or recurs., Disp: 10 tablet, Rfl: 2   tirzepatide 5 MG/0.5ML injection vial, Inject 5 mg into the skin once a week., Disp: 2 mL, Rfl: 5   traZODone (DESYREL) 50 MG tablet, TAKE 1/2 TO 1 TABLET BY MOUTH AT BEDTIME AS NEEDED FOR SLEEP, Disp: 90 tablet, Rfl: 1    Vitamin D, Cholecalciferol, 25 MCG (1000 UT) TABS, Take 1 tablet by mouth daily., Disp: , Rfl:    Medications ordered in this encounter:  Meds ordered this encounter  Medications   promethazine-dextromethorphan (PROMETHAZINE-DM) 6.25-15 MG/5ML syrup    Sig: Take 5 mLs by mouth 4 (four) times daily as needed.    Dispense:  118 mL    Refill:  0    Supervising Provider:   Merrilee Jansky [[id number]]   benzonatate (TESSALON) 100 MG capsule    Sig: Take 1-2 capsules (100-200 mg total) by mouth 3 (three) times daily as needed.    Dispense:  30 capsule    Refill:  0    Supervising Provider:   Merrilee Jansky [[id number]]   albuterol (VENTOLIN HFA) 108 (90 Base) MCG/ACT inhaler    Sig: Inhale 1-2 puffs into the lungs every 6 (six) hours as needed.    Dispense:  8 g    Refill:  0    Supervising Provider:   Merrilee Jansky [[id number]]     *If you need refills on other medications prior to your next appointment, please contact your pharmacy*  Follow-Up: Call back or seek an in-person evaluation if the symptoms worsen or if the condition fails to improve as anticipated.  [facility] [phone]  Other Instructions Influenza, Adult Influenza is also called the flu. It's an infection that affects your respiratory tract. This includes your nose, throat, windpipe,  and lungs. The flu is contagious. This means it spreads easily from person to person. It causes symptoms that are like a cold. It can also cause a high fever and body aches. What are the causes? The flu is caused by the influenza virus. You can get it by: Breathing in droplets that are in the air after an infected person coughs or sneezes. Touching something that has the virus on it and then touching your mouth, nose, or eyes. What increases the risk? You may be more likely to get the flu if: You don't wash your hands often. You're near a lot of people during cold and flu season. You touch your mouth, eyes, or nose  without washing your hands first. You don't get a flu shot each year. You may also be more at risk for the flu and serious problems, such as a lung infection called pneumonia, if: You're older than 65. You're pregnant. Your immune system is weak. Your immune system is your body's defense system. You have a long-term, or chronic, condition, such as: Heart, kidney, or lung disease. Diabetes. A liver disorder. Asthma. You're very overweight. You have anemia. This is when you don't have enough red blood cells in your body. What are the signs or symptoms? Flu symptoms often start all of a sudden. They may last 4-14 days and include: Fever and chills. Headaches, body aches, or muscle aches. Sore throat. Cough. Runny or stuffy nose. Discomfort in your chest. Not wanting to eat as much as normal. Feeling weak or tired. Feeling dizzy. Nausea or vomiting. How is this diagnosed? The flu may be diagnosed based on your symptoms and medical history. You may also have a physical exam. A swab may be taken from your nose or throat and tested for the virus. How is this treated? If the flu is found early, you can be treated with antiviral medicine. This may be given to you by mouth or through an IV. It can help you feel less sick and get better faster. Taking care of yourself at home can also help your symptoms get better. Your health care provider may tell you to: Take over-the-counter medicines. Drink lots of fluids. The flu often goes away on its own. If you have very bad symptoms or problems caused by the flu, you may need to be treated in a hospital. Follow these instructions at home: Activity Rest as needed. Get lots of sleep. Stay home from work or school as told by your provider. Leave home only to go see your provider. Do not leave home for other reasons until you don't have a fever for 24 hours without taking medicine. Eating and drinking Take an oral rehydration solution (ORS). This  is a drink that is sold at pharmacies and stores. Drink enough fluid to keep your pee pale yellow. Try to drink small amounts of clear fluids. These include water, ice chips, fruit juice mixed with water, and low-calorie sports drinks. Try to eat bland foods that are easy to digest. These include bananas, applesauce, rice, lean meats, toast, and crackers. Avoid drinks that have a lot of sugar or caffeine in them. These include energy drinks, regular sports drinks, and soda. Do not drink alcohol. Do not eat spicy or fatty foods. General instructions     Take your medicines only as told by your provider. Use a cool mist humidifier to add moisture to the air in your home. This can make it easier for you to breathe. You should also  clean the humidifier every day. To do so: Empty the water. Pour clean water in. Cover your mouth and nose when you cough or sneeze. Wash your hands with soap and water often and for at least 20 seconds. It's extra important to do so after you cough or sneeze. If you can't use soap and water, use hand sanitizer. How is this prevented?  Get a flu shot every year. Ask your provider when you should get your flu shot. Stay away from people who are sick during fall and winter. Fall and winter are cold and flu season. Contact a health care provider if: You get new symptoms. You have chest pain. You have watery poop, also called diarrhea. You have a fever. Your cough gets worse. You start to have more mucus. You feel like you may vomit, or you vomit. Get help right away if: You become short of breath or have trouble breathing. Your skin or nails turn blue. You have very bad pain or stiffness in your neck. You get a sudden headache or pain in your face or ear. You vomit each time you eat or drink. These symptoms may be an emergency. Call 911 right away. Do not wait to see if the symptoms will go away. Do not drive yourself to the hospital. This information is not  intended to replace advice given to you by your health care provider. Make sure you discuss any questions you have with your health care provider. Document Revised: 06/15/2022 Document Reviewed: 06/15/2022 Elsevier Patient Education  2024 Elsevier Inc.    If you have been instructed to have an in-person evaluation today at a local Urgent Care facility, please use the link below. It will take you to a list of all of our available [facility], including address, phone number and hours of operation. Please do not delay care.  [facility]  If you or a family member do not have a primary care provider, use the link below to schedule a visit and establish care. When you choose a [facility] primary care physician or advanced practice provider, you gain a long-term partner in health. Find a Primary Care Provider  Learn more about [facility]'s in-office and virtual care options: [facility] - Get Care Now

## 2023-06-07 NOTE — Progress Notes (Signed)
Virtual Visit Consent   Tricia [name], you are scheduled for a virtual visit with a [facility] provider today. Just as with appointments in the office, your consent must be obtained to participate. Your consent will be active for this visit and any virtual visit you may have with one of our providers in the next 365 days. If you have a MyChart account, a copy of this consent can be sent to you electronically.  As this is a virtual visit, video technology does not allow for your provider to perform a traditional examination. This may limit your provider's ability to fully assess your condition. If your provider identifies any concerns that need to be evaluated in person or the need to arrange testing (such as labs, EKG, etc.), we will make arrangements to do so. Although advances in technology are sophisticated, we cannot ensure that it will always work on either your end or our end. If the connection with a video visit is poor, the visit may have to be switched to a telephone visit. With either a video or telephone visit, we are not always able to ensure that we have a secure connection.  By engaging in this virtual visit, you consent to the provision of healthcare and authorize for your insurance to be billed (if applicable) for the services provided during this visit. Depending on your insurance coverage, you may receive a charge related to this service.  I need to obtain your verbal consent now. Are you willing to proceed with your visit today? Tricia [name] has provided verbal consent on 06/07/2023 for a virtual visit (video or telephone). Tricia Loveless, PA-C  Date: 06/07/2023 12:44 PM  Virtual Visit via Video Note   I, Tricia [name], connected with  Tricia [name]  ([id number], 1960/12/20) on 06/07/23 at 12:30 PM EST by a video-enabled telemedicine application and verified that I am speaking with the correct person using two identifiers.  Location: Patient: Virtual Visit  Location Patient: Home Provider: Virtual Visit Location Provider: Home Office   I discussed the limitations of evaluation and management by telemedicine and the availability of in person appointments. The patient expressed understanding and agreed to proceed.    History of Present Illness: Tricia [name] is a 63 y.o. who identifies as a female who was assigned female at birth, and is being seen today for flu-like symptoms.  HPI: URI  This is a new problem. The current episode started in the past 7 days (Returned to town Tuesday and symptoms started on Tuesday). The problem has been gradually worsening. The maximum temperature recorded prior to her arrival was 102 - 102.9 F. The fever has been present for Less than 1 day. Associated symptoms include congestion, coughing, ear pain, headaches, rhinorrhea and a sore throat. Pertinent negatives include no diarrhea, nausea, plugged ear sensation, sinus pain, vomiting or wheezing. The treatment provided no relief.  Has had Flu Vaccine.  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 Newell Coral given phenobarbital for post surgery seizures, controlled    Migraine headache, with aura, related to menses, Imitrex  abortive    Vitamin D deficiency, taking daily OTC vitamin D 12/24/2007   History of colonic polyps, with polypectomy 2006, 2011 02/27/2007    Allergies:  Allergies  Allergen Reactions   Codeine Sulfate     REACTION: unspecified   Phenytoin Sodium Extended Rash   Medications:  Current Outpatient Medications:    albuterol (VENTOLIN HFA) 108 (90 Base) MCG/ACT inhaler, Inhale 1-2 puffs into the lungs every 6 (six) hours as needed., Disp: 8 g, Rfl: 0   benzonatate (TESSALON) 100 MG capsule, Take 1-2 capsules (100-200 mg total) by mouth 3 (three) times daily as needed., Disp: 30 capsule, Rfl: 0   promethazine-dextromethorphan (PROMETHAZINE-DM) 6.25-15 MG/5ML syrup, Take 5 mLs by mouth 4 (four) times daily as needed., Disp: 118 mL, Rfl: 0   B Complex-Folic Acid (B COMPLEX MAXI PO), Take 1 tablet by mouth daily in the afternoon., Disp: , Rfl:    cyanocobalamin (VITAMIN B12) 1000 MCG tablet, Take 1,000 mcg by mouth daily., Disp: , Rfl:    Multiple Vitamins-Minerals (CENTRUM SILVER 50+WOMEN) TABS, Take 1 tablet by mouth daily., Disp: , Rfl:    PHENobarbital (LUMINAL) 97.2 MG tablet, Take 97.2 mg by mouth at bedtime., Disp: , Rfl:    SUMAtriptan (IMITREX) 100 MG tablet, Take 1 tablet (100 mg total) by mouth once for 1 dose. May repeat in 2 hours if headache persists or recurs., Disp: 10 tablet, Rfl: 2   tirzepatide 5 MG/0.5ML injection vial, Inject 5 mg into the skin once a week., Disp: 2 mL, Rfl: 5   traZODone (DESYREL) 50 MG tablet, TAKE 1/2 TO 1 TABLET BY MOUTH AT BEDTIME AS NEEDED FOR SLEEP, Disp: 90 tablet, Rfl: 1   Vitamin D, Cholecalciferol, 25 MCG (1000 UT) TABS, Take 1 tablet by mouth daily., Disp: , Rfl:   Observations/Objective: Patient is well-developed, well-nourished in no acute distress.  Resting comfortably  at home.  Does appear ill Head is normocephalic, atraumatic.  No labored breathing.  Speech is clear and coherent with logical content.  Patient is alert and oriented at  baseline.   Assessment and Plan: 1. Influenza A (Primary) - promethazine-dextromethorphan (PROMETHAZINE-DM) 6.25-15 MG/5ML syrup; Take 5 mLs by mouth 4 (four) times daily as needed.  Dispense: 118 mL; Refill: 0 - benzonatate (TESSALON) 100 MG capsule; Take 1-2 capsules (100-200 mg total) by mouth 3 (three) times daily as needed.  Dispense: 30 capsule; Refill: 0 - albuterol (VENTOLIN HFA) 108 (90 Base) MCG/ACT inhaler; Inhale 1-2 puffs into the lungs every 6 (six) hours as needed.  Dispense: 8 g; Refill: 0  - Suspect influenza due to symptoms and high prevalence in the community - Tamiflu NOT prescribed due to being out of the 48 hour window - Tessalon perles and Promethazine DM for cough - Add Albuterol inhaler for shortness of breath and/or wheezing - Continue OTC medication of choice for symptomatic management - Push fluids - Rest - Seek in person evaluation if symptoms worsen or fail to improve   Follow Up Instructions: I discussed the assessment and treatment plan with the patient. The patient was provided an opportunity to ask questions and all were answered. The patient agreed  with the plan and demonstrated an understanding of the instructions.  A copy of instructions were sent to the patient via MyChart unless otherwise noted below.    The patient was advised to call back or seek an in-person evaluation if the symptoms worsen or if the condition fails to improve as anticipated.    Tricia Loveless, PA-C

## 2023-06-07 NOTE — Progress Notes (Signed)
I have spent 5 minutes in review of e-visit questionnaire, review and updating patient chart, medical decision making and response to patient.   Mia Milan Cody Jacklynn Dehaas, PA-C    

## 2023-06-07 NOTE — Progress Notes (Signed)
E visit for Flu like symptoms   We are sorry that you are not feeling well.  Here is how we plan to help! Based on what you have shared with me it looks like you may have flu-like symptoms that should be watched but do not seem to indicate anti-viral treatment.  Influenza or "the flu" is   an infection caused by a respiratory virus. The flu virus is highly contagious and persons who did not receive their yearly flu vaccination may "catch" the flu from close contact.  We have anti-viral medications to treat the viruses that cause this infection. They are not a "cure" and only shorten the course of the infection. These prescriptions are most effective when they are given within the first 2 days of "flu" symptoms. Antiviral medication are indicated if you have a high risk of complications from the flu. You should  also consider an antiviral medication if you are in close contact with someone who is at risk. These medications can help patients avoid complications from the flu  but have side effects that you should know. Possible side effects from Tamiflu or oseltamivir include nausea, vomiting, diarrhea, dizziness, headaches, eye redness, sleep problems or other respiratory symptoms. You should not take Tamiflu if you have an allergy to oseltamivir or any to the ingredients in Tamiflu.  Based upon your symptoms and potential risk factors I recommend that you follow the flu symptoms recommendation that I have listed below. You are outside of the time window for antiviral medication.   Please keep well-hydrated and try to get plenty of rest. If you have a humidifier, place it in the bedroom and run it at night. Start a saline nasal rinse for nasal congestion. You can consider use of a nasal steroid spray like Flonase or Nasacort OTC. You can alternate between Tylenol and Ibuprofen if needed for fever, body aches, headache and/or throat pain. Salt water-gargles and chloraseptic spray can be very beneficial  for sore throat. Mucinex-DM for congestion or cough. Please take all prescribed medications as directed.  Remain out of work until [employer] for 24 hours without a fever-reducing medication, and you are feeling better.  You should mask until symptoms are resolved.  If anything worsens despite treatment, you need to be evaluated in-person. Please do not delay care.   ANYONE WHO HAS FLU SYMPTOMS SHOULD: Stay home. The flu is highly contagious and going out or to work exposes others! Be sure to drink plenty of fluids. Water is fine as well as fruit juices, sodas and electrolyte beverages. You may want to stay away from caffeine or alcohol. If you are nauseated, try taking small sips of liquids. How do you know if you are getting enough fluid? Your urine should be a pale yellow or almost colorless. Get rest. Taking a steamy shower or using a humidifier may help nasal congestion and ease sore throat pain. Using a saline nasal spray works much the same way. Cough drops, hard candies and sore throat lozenges may ease your cough. Line up a caregiver. Have someone check on you regularly.   GET HELP RIGHT AWAY IF: You cannot keep down liquids or your medications. You become short of breath Your fell like you are going to pass out or loose consciousness. Your symptoms persist after you have completed your treatment plan MAKE SURE YOU  Understand these instructions. Will watch your condition. Will get help right away if you are not doing well or get worse.  Your e-visit answers  were reviewed by a board certified advanced clinical practitioner to complete your personal care plan.  Depending on the condition, your plan could have included both over the counter or prescription medications.  If there is a problem please reply  once you have received a response from your provider.  Your safety is important to [place].  If you have drug allergies check your prescription carefully.    You can use MyChart  to ask questions about today's visit, request a non-urgent call back, or ask for a work or school excuse for 24 hours related to this e-Visit. If it has been greater than 24 hours you will need to follow up with your provider, or enter a new e-Visit to address those concerns.  You will get an e-mail in the next two days asking about your experience.  I hope that your e-visit has been valuable and will speed your recovery. Thank you for using e-visits.

## 2023-08-02 ENCOUNTER — Other Ambulatory Visit: Payer: Self-pay | Admitting: Physician Assistant

## 2023-08-24 ENCOUNTER — Other Ambulatory Visit: Payer: Self-pay | Admitting: Physician Assistant

## 2023-09-05 ENCOUNTER — Other Ambulatory Visit: Payer: Self-pay | Admitting: Physician Assistant

## 2023-09-05 DIAGNOSIS — F5101 Primary insomnia: Secondary | ICD-10-CM

## 2023-09-17 ENCOUNTER — Other Ambulatory Visit: Payer: Self-pay | Admitting: Physician Assistant

## 2023-09-17 ENCOUNTER — Encounter: Payer: Self-pay | Admitting: *Deleted

## 2023-09-17 ENCOUNTER — Telehealth: Payer: Self-pay | Admitting: *Deleted

## 2023-09-17 MED ORDER — TIRZEPATIDE-WEIGHT MANAGEMENT 7.5 MG/0.5ML ~~LOC~~ SOLN: 7.5000 mg | SUBCUTANEOUS | 0 refills | Status: DC

## 2023-09-17 NOTE — Telephone Encounter (Signed)
 Spoke to pt asked her if she wanted to increase her dose of Zepbound  to 7.5 mg weekly? Pt said yes, that would be fine. Told her okay will send to [employer]. Pt verbalized understanding.

## 2023-09-17 NOTE — Telephone Encounter (Signed)
 Tricia [name], pt wants to know how she can wean off of Trazodone ?

## 2023-09-18 NOTE — Telephone Encounter (Signed)
 Noted.

## 2023-10-04 ENCOUNTER — Other Ambulatory Visit: Payer: Self-pay | Admitting: Physician Assistant

## 2023-10-17 ENCOUNTER — Other Ambulatory Visit: Payer: Self-pay | Admitting: Physician Assistant

## 2023-10-17 DIAGNOSIS — Z1231 Encounter for screening mammogram for malignant neoplasm of breast: Secondary | ICD-10-CM

## 2023-10-26 ENCOUNTER — Other Ambulatory Visit: Payer: Self-pay | Admitting: Physician Assistant

## 2023-10-30 ENCOUNTER — Encounter: Payer: Self-pay | Admitting: Physician Assistant

## 2023-10-31 NOTE — Telephone Encounter (Signed)
 Spoke to pt, told her I tried to cancel Rx,but the person said that you need to go to the website. Asked pt what dose are you on cause we just sent in the 7.5 mg. Pt said she has 7.5 mg and received text that she is due for refill. Told her to cancel Rx on text message and continue to cancel until you need a refill. Let me know when you are down to your last 2 shots and also if you want to go up to next dose. Pt verbalized understanding.

## 2023-11-05 ENCOUNTER — Encounter: Payer: Self-pay | Admitting: Gastroenterology

## 2023-11-10 ENCOUNTER — Emergency Department (HOSPITAL_BASED_OUTPATIENT_CLINIC_OR_DEPARTMENT_OTHER)

## 2023-11-10 ENCOUNTER — Emergency Department (HOSPITAL_BASED_OUTPATIENT_CLINIC_OR_DEPARTMENT_OTHER)
Admission: EM | Admit: 2023-11-10 | Discharge: 2023-11-10 | Disposition: A | Discharge status: Home / Self Care | Attending: Emergency Medicine | Admitting: Emergency Medicine

## 2023-11-10 ENCOUNTER — Other Ambulatory Visit: Payer: Self-pay

## 2023-11-10 ENCOUNTER — Encounter (HOSPITAL_BASED_OUTPATIENT_CLINIC_OR_DEPARTMENT_OTHER): Payer: Self-pay | Admitting: *Deleted

## 2023-11-10 ENCOUNTER — Emergency Department (HOSPITAL_BASED_OUTPATIENT_CLINIC_OR_DEPARTMENT_OTHER): Admitting: Radiology

## 2023-11-10 ENCOUNTER — Other Ambulatory Visit (HOSPITAL_BASED_OUTPATIENT_CLINIC_OR_DEPARTMENT_OTHER): Payer: Self-pay

## 2023-11-10 DIAGNOSIS — R7989 Other specified abnormal findings of blood chemistry: Secondary | ICD-10-CM | POA: Insufficient documentation

## 2023-11-10 DIAGNOSIS — R0789 Other chest pain: Secondary | ICD-10-CM | POA: Diagnosis present

## 2023-11-10 DIAGNOSIS — R1084 Generalized abdominal pain: Secondary | ICD-10-CM | POA: Insufficient documentation

## 2023-11-10 DIAGNOSIS — K2980 Duodenitis without bleeding: Secondary | ICD-10-CM

## 2023-11-10 DIAGNOSIS — Z87891 Personal history of nicotine dependence: Secondary | ICD-10-CM | POA: Insufficient documentation

## 2023-11-10 LAB — URINALYSIS, ROUTINE W REFLEX MICROSCOPIC
Glucose, UA: NEGATIVE mg/dL
Ketones, ur: NEGATIVE mg/dL
Nitrite: NEGATIVE
Protein, ur: 100 mg/dL — AB
Specific Gravity, Urine: >1.03 — ABNORMAL HIGH (ref 1.005–1.030)
pH: 5.5 (ref 5.0–8.0)

## 2023-11-10 LAB — CBC
HCT: 45.6 % (ref 36.0–46.0)
Hemoglobin: 14.6 g/dL (ref 12.0–15.0)
MCH: 28.6 pg (ref 26.0–34.0)
MCHC: 32 g/dL (ref 30.0–36.0)
MCV: 89.2 fL (ref 80.0–100.0)
Platelets: 367 10*3/uL (ref 150–400)
RBC: 5.11 MIL/uL (ref 3.87–5.11)
RDW: 12.8 % (ref 11.5–15.5)
WBC: 9.2 10*3/uL (ref 4.0–10.5)
nRBC: 0 % (ref 0.0–0.2)

## 2023-11-10 LAB — BASIC METABOLIC PANEL WITH GFR
Anion gap: 15 (ref 5–15)
BUN: 9 mg/dL (ref 8–23)
CO2: 22 mmol/L (ref 22–32)
Calcium: 10 mg/dL (ref 8.9–10.3)
Chloride: 102 mmol/L (ref 98–111)
Creatinine, Ser: 0.89 mg/dL (ref 0.44–1.00)
GFR, Estimated: >60 mL/min (ref >60)
Glucose, Bld: 107 mg/dL — ABNORMAL HIGH (ref 70–99)
Potassium: 4.4 mmol/L (ref 3.5–5.1)
Sodium: 139 mmol/L (ref 135–145)

## 2023-11-10 LAB — URINALYSIS, MICROSCOPIC (REFLEX)

## 2023-11-10 LAB — HEPATIC FUNCTION PANEL
ALT: 48 U/L — ABNORMAL HIGH (ref 0–44)
AST: 24 U/L (ref 15–41)
Albumin: 4.1 g/dL (ref 3.5–5.0)
Alkaline Phosphatase: 99 U/L (ref 38–126)
Bilirubin, Direct: 0.1 mg/dL (ref 0.0–0.2)
Indirect Bilirubin: 0.2 mg/dL — ABNORMAL LOW (ref 0.3–0.9)
Total Bilirubin: 0.3 mg/dL (ref 0.0–1.2)
Total Protein: 7.2 g/dL (ref 6.5–8.1)

## 2023-11-10 LAB — LIPASE, BLOOD: Lipase: 76 U/L — ABNORMAL HIGH (ref 11–51)

## 2023-11-10 LAB — TROPONIN T, HIGH SENSITIVITY
Troponin T High Sensitivity: <15 ng/L (ref <19)
Troponin T High Sensitivity: <15 ng/L (ref <19)

## 2023-11-10 MED ORDER — FENTANYL CITRATE PF 50 MCG/ML IJ SOSY
12.5000 ug | PREFILLED_SYRINGE | Freq: Once | INTRAMUSCULAR | Status: AC
  Administered 2023-11-10: 12.5 ug via INTRAVENOUS
  Filled 2023-11-10: qty 1

## 2023-11-10 MED ORDER — PANTOPRAZOLE SODIUM 40 MG PO TBEC
40.0000 mg | DELAYED_RELEASE_TABLET | Freq: Every day | ORAL | 1 refills | Status: DC
  Filled 2023-11-10: qty 14, 14d supply, fill #0

## 2023-11-10 MED ORDER — IOHEXOL 300 MG/ML  SOLN
100.0000 mL | Freq: Once | INTRAMUSCULAR | Status: AC | PRN
  Administered 2023-11-10: 100 mL via INTRAVENOUS

## 2023-11-10 MED ORDER — HYDROMORPHONE HCL 1 MG/ML IJ SOLN: 1.0000 mg | Freq: Once | INTRAMUSCULAR | Status: DC

## 2023-11-10 MED ORDER — ONDANSETRON HCL 4 MG/2ML IJ SOLN
4.0000 mg | Freq: Once | INTRAMUSCULAR | Status: AC
  Administered 2023-11-10: 4 mg via INTRAVENOUS
  Filled 2023-11-10: qty 2

## 2023-11-10 NOTE — ED Provider Notes (Addendum)
 [place] EMERGENCY DEPARTMENT AT [facility] Provider Note   CSN: [id number] Arrival date & time: 11/10/23  1059     Patient presents with: Chest Pain   Tricia [name] is a 63 y.o. female.   Patient with a complaint of lower chest pain all the way down through the abdomen since Wednesday.  Patient is also feeling like she has been constipated for about 2 weeks.  Associated with nausea and chills but no fever no vomiting or diarrhea.  Patient had her gallbladder removed in the past.  At her esophagus stretched.  And had a brain tumor removed many years ago.  Patient's past medical history) significant for mitral valve prolapse jaundice due to the hepatitis.  Meningioma migraine headaches history of hepatitis C gastroesophageal reflux disease hyperlipidemia.  In past history of seizures but has not had one since 1998 is on medication for that.  Patient denies being on any blood thinners.  Patient is on the Zepbound .  Patient is on trazodone .  Also past surgical history significant for appendectomy.  Patient is a former smoker quit in 1975       Prior to Admission medications   Medication Sig Start Date End Date Taking? Authorizing Provider  albuterol  (VENTOLIN  HFA) 108 (90 Base) MCG/ACT inhaler Inhale 1-2 puffs into the lungs every 6 (six) hours as needed. 06/07/23   Vivienne Delon CHRISTELLA, PA-C  B Complex-Folic Acid (B COMPLEX MAXI PO) Take 1 tablet by mouth daily in the afternoon.    [provider]  benzonatate  (TESSALON ) 100 MG capsule Take 1-2 capsules (100-200 mg total) by mouth 3 (three) times daily as needed. 06/07/23   Vivienne Delon CHRISTELLA, PA-C  cyanocobalamin  (VITAMIN B12) 1000 MCG tablet Take 1,000 mcg by mouth daily.    [provider]  Multiple Vitamins-Minerals (CENTRUM SILVER 50+WOMEN) TABS Take 1 tablet by mouth daily.    [provider]  PHENobarbital  (LUMINAL) 97.2 MG tablet Take 97.2 mg by mouth at bedtime.    Nudelman, Robert, MD   promethazine -dextromethorphan (PROMETHAZINE -DM) 6.25-15 MG/5ML syrup Take 5 mLs by mouth 4 (four) times daily as needed. 06/07/23   Vivienne Delon CHRISTELLA, PA-C  SUMAtriptan  (IMITREX ) 100 MG tablet Take 1 tablet (100 mg total) by mouth once for 1 dose. May repeat in 2 hours if headache persists or recurs. 02/03/21 07/20/22  Job Lukes, PA  tirzepatide  (ZEPBOUND ) 7.5 MG/0.5ML injection vial INJECT 0.5 ML (7.5 MG) UNDER THE SKIN ONCE WEEKLY (0.5ML= 50 UNITS) 10/26/23   Job Lukes, PA  traZODone  (DESYREL ) 50 MG tablet TAKE 1/2 TO 1 TABLET BY MOUTH AT BEDTIME AS NEEDED FOR SLEEP 09/05/23   Job Lukes, PA  Vitamin D , Cholecalciferol , 25 MCG (1000 UT) TABS Take 1 tablet by mouth daily.    [provider]  ZEPBOUND  5 MG/0.5ML injection vial INJECT 0.5 ML (5 MG) UNDER THE SKIN ONCE WEEKLY (0.5ML= 50 UNITS) 08/24/23   Job Lukes, PA    Allergies: Codeine sulfate and Phenytoin sodium extended    Review of Systems  Constitutional:  Positive for chills. Negative for fever.  HENT:  Negative for ear pain and sore throat.   Eyes:  Negative for pain and visual disturbance.  Respiratory:  Negative for cough and shortness of breath.   Cardiovascular:  Positive for chest pain. Negative for palpitations.  Gastrointestinal:  Positive for abdominal pain and nausea. Negative for vomiting.  Genitourinary:  Negative for dysuria and hematuria.  Musculoskeletal:  Negative for arthralgias and back pain.  Skin:  Negative for color change and rash.  Neurological:  Negative for seizures and syncope.  All other systems reviewed and are negative.   Updated Vital Signs BP 130/66   Pulse 70   Temp 98 F (36.7 C)   Resp 12   LMP 12/21/2010   SpO2 95%   Physical Exam Vitals and nursing note reviewed.  Constitutional:      General: She is not in acute distress.    Appearance: Normal appearance. She is well-developed.  HENT:     Head: Normocephalic and atraumatic.   Eyes:     Extraocular  Movements: Extraocular movements intact.     Conjunctiva/sclera: Conjunctivae normal.     Pupils: Pupils are equal, round, and reactive to light.    Cardiovascular:     Rate and Rhythm: Normal rate and regular rhythm.     Heart sounds: No murmur heard. Pulmonary:     Effort: Pulmonary effort is normal. No respiratory distress.     Breath sounds: Normal breath sounds.  Abdominal:     General: There is distension.     Palpations: Abdomen is soft.     Tenderness: There is abdominal tenderness.   Musculoskeletal:        General: No swelling.     Cervical back: Normal range of motion and neck supple.   Skin:    General: Skin is warm and dry.     Capillary Refill: Capillary refill takes less than 2 seconds.   Neurological:     General: No focal deficit present.     Mental Status: She is alert and oriented to person, place, and time.   Psychiatric:        Mood and Affect: Mood normal.     (all labs ordered are listed, but only abnormal results are displayed) Labs Reviewed  BASIC METABOLIC PANEL WITH GFR - Abnormal; Notable for the following components:      Result Value   Glucose, Bld 107 (*)    All other components within normal limits  LIPASE, BLOOD - Abnormal; Notable for the following components:   Lipase 76 (*)    All other components within normal limits  URINALYSIS, ROUTINE W REFLEX MICROSCOPIC - Abnormal; Notable for the following components:   Specific Gravity, Urine >1.030 (*)    Hgb urine dipstick TRACE (*)    Bilirubin Urine SMALL (*)    Protein, ur 100 (*)    Leukocytes,Ua SMALL (*)    All other components within normal limits  URINALYSIS, MICROSCOPIC (REFLEX) - Abnormal; Notable for the following components:   Bacteria, UA RARE (*)    All other components within normal limits  CBC  HEPATIC FUNCTION PANEL  TROPONIN T, HIGH SENSITIVITY  TROPONIN T, HIGH SENSITIVITY    EKG: EKG Interpretation Date/Time:  Saturday November 10 2023 11:11:10 EDT Ventricular  Rate:  111 PR Interval:  132 QRS Duration:  80 QT Interval:  322 QTC Calculation: 437 R Axis:   43  Text Interpretation: Sinus tachycardia Nonspecific ST abnormality Abnormal ECG When compared with ECG of 05-Dec-2013 08:30, PREVIOUS ECG IS PRESENT No significant change since last tracing Confirmed by Orian Amberg [phone]) on 11/10/2023 11:26:44 AM  Radiology: [facility] Chest 2 View Result Date: 11/10/2023 CLINICAL DATA:  Chest pain EXAM: CHEST - 2 VIEW COMPARISON:  None Available. FINDINGS: No consolidation, pneumothorax or effusion. No edema. Normal cardiopericardial silhouette. Slight curvature of the spine. Degenerative changes. Overlapping artifacts. IMPRESSION: No acute cardiopulmonary disease. Electronically Signed   By: Ranell  Charlanne M.D.   On: 11/10/2023 11:25     Procedures   Medications Ordered in the ED  iohexol (OMNIPAQUE) 300 MG/ML solution 100 mL (has no administration in time range)                                    Medical Decision Making Amount and/or Complexity of Data Reviewed Labs: ordered. Radiology: ordered.  Risk Prescription drug management.   Patient is abdomen a bit firm and distended.  No guarding.  Workup here urinalysis very concentrated.  But not consistent with urinary tract infection.  Lipase slightly elevated 76.  Basic metabolic panel is normal including renal function.  Liver function tests were not done.  CBC white count 9.2 hemoglobin 14.6.  Initial troponin reassuring at less than 15 but will need delta troponin.  Chest x-ray without any acute process.  Patient will need hepatic function panel will need CT scan abdomen pelvis will need delta troponin.  Troponins both less than 15.  Hepatic function panel is normal.  CT abdomen and pelvis suggestive of mild proximal duodenitis no evidence of pancreatitis postcholecystectomy no duct dilatation.  And chest x-ray had no acute pulmonary findings.  Based on this we will probably treat with Protonix .   And get her to follow-up with GI medicine.  Patient also has concerns about no bowel movements.  There was no evidence of any significant constipation.  Will treat patient with Protonix .  She is followed by [facility] gastro[facility]ology will give them a call on Monday.  Lipase was slightly up with no evidence of any pancreatic inflammation on the CT scan.  Final diagnoses:  Atypical chest pain  Generalized abdominal pain    ED Discharge Orders     None          Geraldene Hamilton, MD 11/10/23 1339    Geraldene Hamilton, MD 11/10/23 1544

## 2023-11-10 NOTE — Discharge Instructions (Addendum)
 Take the Protonix  as directed.  Give [facility] a call on Monday for follow-up of the inflammation in the first part of the small intestines.  Also would recommend following with your primary care doctor regarding the chest pain which the workup today was without any acute findings.  Return for any new or worse symptoms.

## 2023-11-10 NOTE — ED Triage Notes (Signed)
 Patient to ED reporting chest tightness with abd pain that patient reports feeling like a bomb is going off in my stomach Nausea and chills. No fevers.   2 weeks ago patient had been constipated constipated x 2 weeks and with medications at home she has had some bowel movements.

## 2023-11-12 ENCOUNTER — Ambulatory Visit

## 2023-11-13 ENCOUNTER — Telehealth: Payer: Self-pay | Admitting: Gastroenterology

## 2023-11-13 NOTE — Telephone Encounter (Signed)
 Inbound call from patient, states she is having extreme abdominal pain, patient states she went to the ED and they stated she had to be seen as soon as possible. Patient's appointment was moved up to 7/15 at 10:40 AM with Delon Failing, PA. Patient states she would liek to speak to a nurse to further advise on what to do for symptoms.

## 2023-11-14 NOTE — Telephone Encounter (Signed)
 Moved to a sooner opening.

## 2023-11-15 ENCOUNTER — Ambulatory Visit: Admitting: Physician Assistant

## 2023-11-15 ENCOUNTER — Encounter: Payer: Self-pay | Admitting: Physician Assistant

## 2023-11-15 VITALS — BP 112/78 | HR 86 | Ht 65.0 in | Wt 261.2 lb

## 2023-11-15 DIAGNOSIS — R0789 Other chest pain: Secondary | ICD-10-CM | POA: Diagnosis not present

## 2023-11-15 DIAGNOSIS — K5900 Constipation, unspecified: Secondary | ICD-10-CM | POA: Diagnosis not present

## 2023-11-15 DIAGNOSIS — R1013 Epigastric pain: Secondary | ICD-10-CM | POA: Diagnosis not present

## 2023-11-15 MED ORDER — PANTOPRAZOLE SODIUM 40 MG PO TBEC: 40.0000 mg | DELAYED_RELEASE_TABLET | Freq: Every day | ORAL | 3 refills | Status: DC

## 2023-11-15 NOTE — Patient Instructions (Signed)
 We have sent the following medications to your pharmacy for you to pick up at your convenience: Pantoprazole  40 mg daily 30-60 minutes before breakfast.   _______________________________________________________  If your blood pressure at your visit was 140/90 or greater, please contact your primary care physician to follow up on this.  _______________________________________________________  If you are age 65 or older, your body mass index should be between 23-30. Your Body mass index is 43.47 kg/m. If this is out of the aforementioned range listed, please consider follow up with your Primary Care Provider.  If you are age 96 or younger, your body mass index should be between 19-25. Your Body mass index is 43.47 kg/m. If this is out of the aformentioned range listed, please consider follow up with your Primary Care Provider.   ________________________________________________________  The [place] [facility] providers would like to encourage you to use MYCHART to communicate with providers for non-urgent requests or questions.  Due to long hold times on the telephone, sending your provider a message by [facility] may be a faster and more efficient way to get a response.  Please allow 48 business hours for a response.  Please remember that this is for non-urgent requests.  _______________________________________________________

## 2023-11-15 NOTE — Progress Notes (Signed)
 Chief Complaint: Abdominal Pain, chest pain  HPI:    Tricia [name] is a 63 year old female with a past medical history as listed below including previous cholecystectomy on Zepbound  for weight loss, known to Dr. Shila, who was referred to me by Job Lukes, PA for a complaint of atypical chest pain.      08/11/2020 colonoscopy for screening and a family history of colon cancer in a first-degree relative with 1 less than 1 mm polyp in the cecum, three 4-6 mm polyps in the transverse colon and diverticulosis in the sigmoid, descending and transverse colon as well as nonbleeding internal hemorrhoids.  Repeat recommended in 5 years.    11/10/2023 patient seen in the ER for lower chest pain all the way down through the abdomen.  Also described constipation for 2 weeks associated nausea and chills but no fever, vomiting or diarrhea.  Labs with a BMP normal, lipase minimally elevated at 76, normal CBC and hepatic function panel.  Chest x-ray with no abnormality.  Patient given Protonix .    11/10/2023 CT abdomen pelvis with contrast showed findings suggestive of mild proximal duodenitis, no evidence of pancreatitis, postcholecystectomy.    Today, patient presents to clinic and tells me that after being put on Pantoprazole  40 mg daily over the weekend her symptoms are completely better.  She also did an MiraLAX  bowel purge under just direction from her daughter who is an Charity fundraiser and feels completely better.  No further symptoms.  Tells me prior to going to ER to been 2 to 3 weeks that she had not had a bowel movement but she found herself using Imodium when she was going out in order to not be unexpectedly hit by a bowel movement and also is on Tirzepetide.  Then found that she was using a lot of Mylanta and Tums for epigastric burning/chest pain.  Again now everything is better.    Does ask about a colonoscopy and if she is up-to-date. Asked about how to lose weight.      Denies fever, chills or unexpected  weight loss.  Past Medical History:  Diagnosis Date   Allergy    seasonal allergies   Cataract    not a surgical candidate at this time (06/04/2020)   Colon polyps 2006, 2011   TUBULAR ADENOMA AND HYPERPLASTIC POLYP   Esophageal stricture 2012   GERD (gastroesophageal reflux disease)    with certain foods   Glucosuria    with normal blood sugar   Hepatitis C aby neg viral copies    serology positive but neg viral copies    HLD (hyperlipidemia)    diet controlled   Jaundice due to hepatitis    hx when younger unknown cause resolved    Meningioma (HCC)    Migraine headache    some aura  period related  imitrex  helps   MVP (mitral valve prolapse)    echo 02 normal   Seizures (HCC)    last noted seizure 1998-current on med to control   Ulcer 2012    Past Surgical History:  Procedure Laterality Date   APPENDECTOMY     BREAST BIOPSY Right 11/24/2022   MM RT BREAST BX W LOC DEV 1ST LESION IMAGE BX SPEC STEREO GUIDE 11/24/2022 [facility]   BUNIONECTOMY     laft   CHOLECYSTECTOMY  2015   Dr Sheldon   CIN D&C conization  2000   COLONOSCOPY  2016   KN-MAC-prep good (Suprep), HPP polypoid   COLONOSCOPY W/ BIOPSIES  2011   SEVERAL    CRANIOTOMY     CYSTECTOMY  1982   ESOPHAGOGASTRODUODENOSCOPY  2012   frontal meningioma resected  1998   left shoulder dislocation surgery  2005   POLYPECTOMY  2016   HPP polypoid   TUBAL LIGATION     WISDOM TOOTH EXTRACTION     WRIST SURGERY  04/21/2017   rt-cyst removal     Current Outpatient Medications  Medication Sig Dispense Refill   B Complex-Folic Acid (B COMPLEX MAXI PO) Take 1 tablet by mouth daily in the afternoon.     cyanocobalamin  (VITAMIN B12) 1000 MCG tablet Take 1,000 mcg by mouth daily.     Multiple Vitamins-Minerals (CENTRUM SILVER 50+WOMEN) TABS Take 1 tablet by mouth daily.     pantoprazole  (PROTONIX ) 40 MG tablet Take 1 tablet (40 mg total) by mouth daily. 14 tablet 1   PHENobarbital  (LUMINAL) 97.2 MG tablet Take  97.2 mg by mouth at bedtime.     SUMAtriptan  (IMITREX ) 100 MG tablet Take 1 tablet (100 mg total) by mouth once for 1 dose. May repeat in 2 hours if headache persists or recurs. (Patient taking differently: Take 100 mg by mouth as needed. May repeat in 2 hours if headache persists or recurs.) 10 tablet 2   tirzepatide  (ZEPBOUND ) 7.5 MG/0.5ML injection vial INJECT 0.5 ML (7.5 MG) UNDER THE SKIN ONCE WEEKLY (0.5ML= 50 UNITS) 2 mL 1   traZODone  (DESYREL ) 50 MG tablet TAKE 1/2 TO 1 TABLET BY MOUTH AT BEDTIME AS NEEDED FOR SLEEP 90 tablet 1   Vitamin D , Cholecalciferol , 25 MCG (1000 UT) TABS Take 1 tablet by mouth daily.     ZEPBOUND  5 MG/0.5ML injection vial INJECT 0.5 ML (5 MG) UNDER THE SKIN ONCE WEEKLY (0.5ML= 50 UNITS) 2 mL 0   No current facility-administered medications for this visit.    Allergies as of 11/15/2023 - Review Complete 11/15/2023  Allergen Reaction Noted   Codeine sulfate     Phenytoin sodium extended Rash 02/08/2011    Family History  Problem Relation Age of Onset   Colon cancer Mother 61   Colon polyps Mother 25   Lung cancer Mother    Leukemia Mother    Cancer Mother        gynecological cancer   Colon cancer Father 34   Hyperlipidemia Father    Coronary artery disease Father 39       MI   Diabetes Father        elderly   Colon polyps Father    Heart attack Father 25   Other Brother        transient global amnesia   Colon polyps Brother 41   Mitral valve prolapse Brother    Lung cancer Maternal Grandmother    Colon cancer Paternal Grandmother 58   Colon polyps Paternal Grandmother 23   Deep vein thrombosis Paternal Grandfather    Crohn's disease Daughter    Coronary artery disease Maternal Uncle 30   Colon cancer Paternal Aunt 65       and again at age 87   Colon polyps Paternal Aunt 67   Colon cancer Paternal Uncle 69   Colon polyps Paternal Uncle 62   Breast cancer Neg Hx    Stomach cancer Neg Hx    Rectal cancer Neg Hx    Esophageal cancer Neg  Hx     Social History   Socioeconomic History   Marital status: Legally Separated    Spouse name: Not on  file   Number of children: 3   Years of education: Not on file   Highest education level: Not on file  Occupational History   Occupation: accounting    Employer: [employer]  Tobacco Use   Smoking status: Former    Current packs/day: 0.00    Types: Cigarettes    Quit date: 05/22/1973    Years since quitting: 50.5   Smokeless tobacco: Never  Vaping Use   Vaping status: Never Used  Substance and Sexual Activity   Alcohol use: Yes    Comment: socially   Drug use: No   Sexual activity: Not on file  Other Topics Concern   Not on file  Social History Narrative   HH of 1 kid out to college    1 dog   Sleep 5-7 hours   Works Designer, fashion/clothing co now Lawyer    Not currnetly working to go back soon  A year    Social Drivers of Corporate investment banker Strain: Not on Ship broker Insecurity: Not on file  Transportation Needs: Not on file  Physical Activity: Not on file  Stress: Not on file  Social Connections: Not on file  Intimate Partner Violence: Not on file    Review of Systems:    Constitutional: No weight loss, fever or chills Skin: No rash  Cardiovascular: See HPI Respiratory: No SOB Gastrointestinal: See HPI and otherwise negative Genitourinary: No dysuria  Neurological: No headache, dizziness or syncope Musculoskeletal: No new muscle or joint pain Hematologic: No bleeding Psychiatric: No history of depression or anxiety   Physical Exam:  Vital signs: BP 112/78   Pulse 86   Ht 5' 5 (1.651 m)   Wt 261 lb 4 oz (118.5 kg)   LMP 12/21/2010   SpO2 96%   BMI 43.47 kg/m   Constitutional:   Pleasant obese Caucasian female appears to be in NAD, Well developed, Well nourished, alert and cooperative Head:  Normocephalic and atraumatic. Eyes:   PEERL, EOMI. No icterus. Conjunctiva pink. Ears:  Normal auditory acuity. Neck:   Supple Throat: Oral cavity and pharynx without inflammation, swelling or lesion.  Respiratory: Respirations even and unlabored. Lungs clear to auscultation bilaterally.   No wheezes, crackles, or rhonchi.  Cardiovascular: Normal S1, S2. No MRG. Regular rate and rhythm. No peripheral edema, cyanosis or pallor.  Gastrointestinal:  Soft, nondistended, mild epigastric ttp. No rebound or guarding. Normal bowel sounds. No appreciable masses or hepatomegaly. Rectal:  Not performed.  Msk:  Symmetrical without gross deformities. Without edema, no deformity or joint abnormality.  Neurologic:  Alert and  oriented x4;  grossly normal neurologically.  Skin:   Dry and intact without significant lesions or rashes. Psychiatric: Demonstrates good judgement and reason without abnormal affect or behaviors.  RELEVANT LABS AND IMAGING: CBC    Component Value Date/Time   WBC 9.2 11/10/2023 1111   RBC 5.11 11/10/2023 1111   HGB 14.6 11/10/2023 1111   HCT 45.6 11/10/2023 1111   PLT 367 11/10/2023 1111   MCV 89.2 11/10/2023 1111   MCH 28.6 11/10/2023 1111   MCHC 32.0 11/10/2023 1111   RDW 12.8 11/10/2023 1111   LYMPHSABS 1.3 02/07/2023 0844   MONOABS 0.5 02/07/2023 0844   EOSABS 0.6 02/07/2023 0844   BASOSABS 0.1 02/07/2023 0844    CMP     Component Value Date/Time   NA 139 11/10/2023 1111   K 4.4 11/10/2023 1111   CL 102 11/10/2023 1111   CO2  22 11/10/2023 1111   GLUCOSE 107 (H) 11/10/2023 1111   BUN 9 11/10/2023 1111   CREATININE 0.89 11/10/2023 1111   CALCIUM 10.0 11/10/2023 1111   PROT 7.2 11/10/2023 1335   ALBUMIN 4.1 11/10/2023 1335   AST 24 11/10/2023 1335   ALT 48 (H) 11/10/2023 1335   ALKPHOS 99 11/10/2023 1335   BILITOT 0.3 11/10/2023 1335   GFRNONAA >60 11/10/2023 1111   GFRAA >90 12/07/2013 0431    Assessment: 1.  Epigastric pain/atypical chest pain: Patient seen in the ED for atypical chest pain, resolved after starting Pantoprazole  40 mg over the past few days; likely  gastritis exacerbated by extreme constipation as well as Tirzepatide  2.  Constipation: Patient went through a period of time where she was extremely constipated, now back to normal bowel movements after MiraLAX  bowel purge  Plan: 1.  Continue Pantoprazole  40 mg daily, 30 minutes before breakfast for the next 2 to 3 months.  She can then try to titrate off going to every other day for a week and then stopping.  If she finds that she does better on this medicine then she can continue it.  We discussed side effect profile and recommendations. 2.  Patient is now back to her normal bowel movements.  Discussed using MiraLAX  in the future if she finds herself getting constipated 3.  Patient asked about screening colonoscopy, she is currently up-to-date and not due until 2027 4.  Patient to follow in clinic as needed.  Delon Failing, PA-C [facility] 11/15/2023, 8:23 AM  Cc: Job Lukes, [place]

## 2023-11-22 ENCOUNTER — Ambulatory Visit
Admission: RE | Admit: 2023-11-22 | Discharge: 2023-11-22 | Disposition: A | Discharge status: Home / Self Care | Source: Ambulatory Visit | Attending: Physician Assistant | Admitting: Physician Assistant

## 2023-11-22 DIAGNOSIS — Z1231 Encounter for screening mammogram for malignant neoplasm of breast: Secondary | ICD-10-CM

## 2023-12-04 ENCOUNTER — Ambulatory Visit: Admitting: Physician Assistant

## 2023-12-21 ENCOUNTER — Ambulatory Visit: Admitting: Gastroenterology

## 2024-01-04 ENCOUNTER — Ambulatory Visit (INDEPENDENT_AMBULATORY_CARE_PROVIDER_SITE_OTHER): Admitting: Plastic Surgery

## 2024-01-04 VITALS — BP 127/73 | HR 90 | Ht 65.5 in | Wt 256.0 lb

## 2024-01-04 DIAGNOSIS — R222 Localized swelling, mass and lump, trunk: Secondary | ICD-10-CM | POA: Insufficient documentation

## 2024-01-04 DIAGNOSIS — R2232 Localized swelling, mass and lump, left upper limb: Secondary | ICD-10-CM | POA: Diagnosis not present

## 2024-01-04 NOTE — Progress Notes (Signed)
 Patient ID: Tricia [name], female    DOB: [birth year], 63 y.o.   MRN: [id number]   Chief Complaint  Patient presents with   Advice Only   Skin Problem    The patient is a 63 year old female here for evaluation of her back.  The patient has had multiple lipomas throughout her body over the past couple of years.  She has a large 1 on her right posterior upper back area near the scapula.  It is getting larger and is around 8 x 8 cm by measurement today.  She also has a mass of the left upper arm on the medial aspect that is fairly deep and tender.  I cannot say for sure that it is a lipoma.  Because it is tender and getting larger I think an ultrasound would be a good idea.  Her past medical history is positive as noted below and she has had surgery in the past which included appendectomy and a cholecystectomy.    Review of Systems  Constitutional: Negative.   HENT: Negative.    Eyes: Negative.   Respiratory: Negative.    Cardiovascular: Negative.   Gastrointestinal: Negative.   Endocrine: Negative.   Genitourinary: Negative.   Musculoskeletal: Negative.     Past Medical History:  Diagnosis Date   Allergy    seasonal allergies   Cataract    not a surgical candidate at this time (06/04/2020)   Colon polyps 2006, 2011   TUBULAR ADENOMA AND HYPERPLASTIC POLYP   Esophageal stricture 2012   GERD (gastroesophageal reflux disease)    with certain foods   Glucosuria    with normal blood sugar   Hepatitis C aby neg viral copies    serology positive but neg viral copies    HLD (hyperlipidemia)    diet controlled   Jaundice due to hepatitis    hx when younger unknown cause resolved    Meningioma (HCC)    Migraine headache    some aura  period related  imitrex  helps   MVP (mitral valve prolapse)    echo 02 normal   Seizures (HCC)    last noted seizure 1998-current on med to control   Ulcer 2012    Past Surgical History:  Procedure Laterality Date   APPENDECTOMY      BREAST BIOPSY Right 11/24/2022   MM RT BREAST BX W LOC DEV 1ST LESION IMAGE BX SPEC STEREO GUIDE 11/24/2022 [facility]   BUNIONECTOMY     laft   CHOLECYSTECTOMY  2015   Dr Sheldon   CIN D&C conization  2000   COLONOSCOPY  2016   KN-MAC-prep good (Suprep), HPP polypoid   COLONOSCOPY W/ BIOPSIES  2011   SEVERAL    CRANIOTOMY     CYSTECTOMY  1982   ESOPHAGOGASTRODUODENOSCOPY  2012   frontal meningioma resected  1998   left shoulder dislocation surgery  2005   POLYPECTOMY  2016   HPP polypoid   TUBAL LIGATION     WISDOM TOOTH EXTRACTION     WRIST SURGERY  04/21/2017   rt-cyst removal       Current Outpatient Medications:    B Complex-Folic Acid (B COMPLEX MAXI PO), Take 1 tablet by mouth daily in the afternoon., Disp: , Rfl:    cyanocobalamin  (VITAMIN B12) 1000 MCG tablet, Take 1,000 mcg by mouth daily., Disp: , Rfl:    Multiple Vitamins-Minerals (CENTRUM SILVER 50+WOMEN) TABS, Take 1 tablet by mouth daily., Disp: , Rfl:  pantoprazole  (PROTONIX ) 40 MG tablet, Take 1 tablet (40 mg total) by mouth daily., Disp: 30 tablet, Rfl: 3   PHENobarbital  (LUMINAL) 97.2 MG tablet, Take 97.2 mg by mouth at bedtime., Disp: , Rfl:    tirzepatide  (ZEPBOUND ) 7.5 MG/0.5ML injection vial, INJECT 0.5 ML (7.5 MG) UNDER THE SKIN ONCE WEEKLY (0.5ML= 50 UNITS), Disp: 2 mL, Rfl: 1   traZODone  (DESYREL ) 50 MG tablet, TAKE 1/2 TO 1 TABLET BY MOUTH AT BEDTIME AS NEEDED FOR SLEEP, Disp: 90 tablet, Rfl: 1   Vitamin D , Cholecalciferol , 25 MCG (1000 UT) TABS, Take 1 tablet by mouth daily., Disp: , Rfl:    ZEPBOUND  5 MG/0.5ML injection vial, INJECT 0.5 ML (5 MG) UNDER THE SKIN ONCE WEEKLY (0.5ML= 50 UNITS), Disp: 2 mL, Rfl: 0   SUMAtriptan  (IMITREX ) 100 MG tablet, Take 1 tablet (100 mg total) by mouth once for 1 dose. May repeat in 2 hours if headache persists or recurs. (Patient taking differently: Take 100 mg by mouth as needed. May repeat in 2 hours if headache persists or recurs.), Disp: 10 tablet, Rfl: 2    Objective:   Vitals:   01/04/24 0830  BP: 127/73  Pulse: 90  SpO2: 95%    Physical Exam Vitals reviewed.  Constitutional:      Appearance: Normal appearance.  HENT:     Head: Atraumatic.  Cardiovascular:     Rate and Rhythm: Normal rate.     Pulses: Normal pulses.  Pulmonary:     Effort: Pulmonary effort is normal.  Abdominal:     Palpations: Abdomen is soft.  Musculoskeletal:       Arms:  Skin:    General: Skin is warm.     Capillary Refill: Capillary refill takes less than 2 seconds.  Neurological:     Mental Status: She is alert and oriented to person, place, and time.  Psychiatric:        Mood and Affect: Mood normal.        Behavior: Behavior normal.        Thought Content: Thought content normal.        Judgment: Judgment normal.     Assessment & Plan:  Arm mass, left  Mass of subcutaneous tissue of back  Plan for excision of back mass and ultrasound of left arm mass.  Pictures were obtained of the patient and placed in the chart with the patient's or guardian's permission.   Estefana RAMAN Michi Herrmann, DO

## 2024-01-07 ENCOUNTER — Telehealth: Payer: Self-pay

## 2024-01-07 NOTE — Telephone Encounter (Signed)
 Surgical clearance sent to [facility] [phone]). Confirmation of receipt.

## 2024-01-08 ENCOUNTER — Encounter: Payer: Self-pay | Admitting: Physician Assistant

## 2024-01-08 ENCOUNTER — Ambulatory Visit (INDEPENDENT_AMBULATORY_CARE_PROVIDER_SITE_OTHER): Admitting: Physician Assistant

## 2024-01-08 VITALS — BP 120/86 | HR 70 | Temp 98.8°F | Ht 65.5 in | Wt 252.4 lb

## 2024-01-08 DIAGNOSIS — F5101 Primary insomnia: Secondary | ICD-10-CM

## 2024-01-08 DIAGNOSIS — Z01818 Encounter for other preprocedural examination: Secondary | ICD-10-CM

## 2024-01-08 DIAGNOSIS — R229 Localized swelling, mass and lump, unspecified: Secondary | ICD-10-CM

## 2024-01-08 DIAGNOSIS — D171 Benign lipomatous neoplasm of skin and subcutaneous tissue of trunk: Secondary | ICD-10-CM

## 2024-01-08 NOTE — Progress Notes (Signed)
 Tricia [name] is a 63 y.o. female here for a follow up of a pre-existing problem.  History of Present Illness:   Chief Complaint  Patient presents with   Pre-op Exam    Pt is here for surgical clearance, having a back mass excised.    Discussed the use of AI scribe software for clinical note transcription with the patient, who gave verbal consent to proceed.  History of Present Illness Tricia [name] is a 63 year old female who presents for follow-up on weight management and evaluation of a lipoma.  She has lost over twenty pounds since starting Zepbound , resulting in improved joint pain and overall well-being. She is working on increasing her protein intake but finds it challenging to discover new foods, especially living alone.  She has a known lipoma that has not grown since last year but is noticeable when bumped. An ultrasound is scheduled to evaluate a painful, mobile knot on the back of her arm, which has been present for years and seems to be worsening.  She developed an ulcer, managed effectively with pantoprazole , and avoids greasy foods and other triggers. She experienced chest pain leading to an ER visit, but symptoms are now controlled with no chest pain or shortness of breath during physical activity.  She is currently taking Zepbound  7.5 mg and 50 mg trazodone , considering reducing the trazodone  dose. She has a history of brain tumors, foot surgery, and shoulder surgery without anesthesia issues. She plans foot surgery due to significant pain and difficulty walking, hoping to have at least one foot operated on this year. She retired in February, reducing her stress levels.    Past Medical History:  Diagnosis Date   Allergy    seasonal allergies   Cataract    not a surgical candidate at this time (06/04/2020)   Colon polyps 2006, 2011   TUBULAR ADENOMA AND HYPERPLASTIC POLYP   Esophageal stricture 2012   GERD (gastroesophageal reflux disease)    with certain  foods   Glucosuria    with normal blood sugar   Hepatitis C aby neg viral copies    serology positive but neg viral copies    HLD (hyperlipidemia)    diet controlled   Jaundice due to hepatitis    hx when younger unknown cause resolved    Meningioma (HCC)    Migraine headache    some aura  period related  imitrex  helps   MVP (mitral valve prolapse)    echo 02 normal   Seizures (HCC)    last noted seizure 1998-current on med to control   Ulcer 2012     Social History   Tobacco Use   Smoking status: Former    Current packs/day: 0.00    Types: Cigarettes    Quit date: 05/22/1973    Years since quitting: 50.6   Smokeless tobacco: Never  Vaping Use   Vaping status: Never Used  Substance Use Topics   Alcohol use: Yes    Comment: socially   Drug use: No    Past Surgical History:  Procedure Laterality Date   APPENDECTOMY     BREAST BIOPSY Right 11/24/2022   MM RT BREAST BX W LOC DEV 1ST LESION IMAGE BX SPEC STEREO GUIDE 11/24/2022 [facility]   BUNIONECTOMY     laft   CHOLECYSTECTOMY  2015   Dr Sheldon   CIN D&C conization  2000   COLONOSCOPY  2016   KN-MAC-prep good (Suprep), HPP polypoid   COLONOSCOPY W/  BIOPSIES  2011   SEVERAL    CRANIOTOMY     CYSTECTOMY  1982   ESOPHAGOGASTRODUODENOSCOPY  2012   frontal meningioma resected  1998   left shoulder dislocation surgery  2005   POLYPECTOMY  2016   HPP polypoid   TUBAL LIGATION     WISDOM TOOTH EXTRACTION     WRIST SURGERY  04/21/2017   rt-cyst removal     Family History  Problem Relation Age of Onset   Colon cancer Mother 51   Colon polyps Mother 72   Lung cancer Mother    Leukemia Mother    Cancer Mother        gynecological cancer   Colon cancer Father 67   Hyperlipidemia Father    Coronary artery disease Father 55       MI   Diabetes Father        elderly   Colon polyps Father    Heart attack Father 69   Crohn's disease Daughter    Coronary artery disease Maternal Uncle 45   Colon cancer  Paternal Aunt 10       and again at age 28   Colon polyps Paternal Aunt 80   Colon cancer Paternal Uncle 34   Colon polyps Paternal Uncle 57   Lung cancer Maternal Grandmother    Colon cancer Paternal Grandmother 11   Colon polyps Paternal Grandmother 44   Deep vein thrombosis Paternal Grandfather    Other Brother        transient global amnesia   Colon polyps Brother 93   Mitral valve prolapse Brother    Breast cancer Neg Hx    Stomach cancer Neg Hx    Rectal cancer Neg Hx    Esophageal cancer Neg Hx    BRCA 1/2 Neg Hx     Allergies  Allergen Reactions   Codeine Sulfate     REACTION: unspecified   Phenytoin Sodium Extended Rash    Current Medications:   Current Outpatient Medications:    B Complex-Folic Acid (B COMPLEX MAXI PO), Take 1 tablet by mouth daily in the afternoon., Disp: , Rfl:    cyanocobalamin  (VITAMIN B12) 1000 MCG tablet, Take 1,000 mcg by mouth daily., Disp: , Rfl:    Multiple Vitamins-Minerals (CENTRUM SILVER 50+WOMEN) TABS, Take 1 tablet by mouth daily., Disp: , Rfl:    pantoprazole  (PROTONIX ) 40 MG tablet, Take 1 tablet (40 mg total) by mouth daily., Disp: 30 tablet, Rfl: 3   PHENobarbital  (LUMINAL) 97.2 MG tablet, Take 97.2 mg by mouth at bedtime., Disp: , Rfl:    SUMAtriptan  (IMITREX ) 100 MG tablet, Take 1 tablet (100 mg total) by mouth once for 1 dose. May repeat in 2 hours if headache persists or recurs. (Patient taking differently: Take 100 mg by mouth as needed. May repeat in 2 hours if headache persists or recurs.), Disp: 10 tablet, Rfl: 2   tirzepatide  (ZEPBOUND ) 7.5 MG/0.5ML injection vial, INJECT 0.5 ML (7.5 MG) UNDER THE SKIN ONCE WEEKLY (0.5ML= 50 UNITS), Disp: 2 mL, Rfl: 1   traZODone  (DESYREL ) 50 MG tablet, TAKE 1/2 TO 1 TABLET BY MOUTH AT BEDTIME AS NEEDED FOR SLEEP, Disp: 90 tablet, Rfl: 1   Vitamin D , Cholecalciferol , 25 MCG (1000 UT) TABS, Take 1 tablet by mouth daily., Disp: , Rfl:    Review of Systems:   Negative unless otherwise  specified per HPI.  Vitals:   Vitals:   01/08/24 1102  BP: 120/86  Pulse: 70  Temp: 98.8 F (  37.1 C)  TempSrc: Temporal  SpO2: 98%  Weight: 252 lb 6.1 oz (114.5 kg)  Height: 5' 5.5 (1.664 m)     Body mass index is 41.36 kg/m.  Physical Exam:   Physical Exam Vitals and nursing note reviewed.  Constitutional:      General: She is not in acute distress.    Appearance: She is well-developed. She is not ill-appearing or toxic-appearing.  Cardiovascular:     Rate and Rhythm: Normal rate and regular rhythm.     Pulses: Normal pulses.     Heart sounds: Normal heart sounds, S1 normal and S2 normal.  Pulmonary:     Effort: Pulmonary effort is normal.     Breath sounds: Normal breath sounds.  Skin:    General: Skin is warm and dry.     Comments: Large mass to right trapezius region without tenderness to palpation or skin changes  Golf-ball sized mass to posterior right upper arm with tenderness to palpation   Neurological:     Mental Status: She is alert.     GCS: GCS eye subscore is 4. GCS verbal subscore is 5. GCS motor subscore is 6.  Psychiatric:        Speech: Speech normal.        Behavior: Behavior normal. Behavior is cooperative.     Assessment and Plan:   Assessment and Plan Assessment & Plan Preoperative clearance // Lipoma on torso Overall low surgical candidate risk Discussed pausing Zepbound  before surgery due to delayed gastric emptying risk. Tolerating well. Pause Zepbound  two weeks prior to surgery. Most recent blood work in ER and EKG reviewed without significant abnormalities  Obesity managed with GLP-1 receptor agonist therapy Significant weight loss with Zepbound , difficulty maintaining protein intake.  Continue GLP-1 as able after surgery  Mass to right upper arm and other site Unchanged lipoma on right upper arm, new painful knot suspected lipoma. Ultrasound planned. - Perform ultrasound on the knot on the back of the arm.  Insomnia Taking  trazodone , wishes to discontinue. Discussed dose reduction to assess need. - Reduce trazodone  to half a tablet nightly. - Discontinue trazodone  if no difference is noted with reduced dose.     Lucie Buttner, PA-C

## 2024-01-10 ENCOUNTER — Ambulatory Visit (HOSPITAL_COMMUNITY)
Admission: RE | Admit: 2024-01-10 | Discharge: 2024-01-10 | Disposition: A | Discharge status: Home / Self Care | Source: Ambulatory Visit | Attending: Plastic Surgery | Admitting: Plastic Surgery

## 2024-01-10 DIAGNOSIS — R222 Localized swelling, mass and lump, trunk: Secondary | ICD-10-CM | POA: Diagnosis present

## 2024-01-10 DIAGNOSIS — R2232 Localized swelling, mass and lump, left upper limb: Secondary | ICD-10-CM | POA: Insufficient documentation

## 2024-01-25 ENCOUNTER — Other Ambulatory Visit

## 2024-02-07 ENCOUNTER — Ambulatory Visit (HOSPITAL_BASED_OUTPATIENT_CLINIC_OR_DEPARTMENT_OTHER)
Admission: RE | Admit: 2024-02-07 | Discharge: 2024-02-07 | Disposition: A | Discharge status: Home / Self Care | Source: Ambulatory Visit | Attending: Physician Assistant | Admitting: Physician Assistant

## 2024-02-07 ENCOUNTER — Ambulatory Visit: Payer: Self-pay | Admitting: Physician Assistant

## 2024-02-07 DIAGNOSIS — E2839 Other primary ovarian failure: Secondary | ICD-10-CM | POA: Diagnosis present

## 2024-02-12 ENCOUNTER — Ambulatory Visit (INDEPENDENT_AMBULATORY_CARE_PROVIDER_SITE_OTHER): Payer: BC Managed Care – PPO | Admitting: Physician Assistant

## 2024-02-12 ENCOUNTER — Encounter: Payer: Self-pay | Admitting: Physician Assistant

## 2024-02-12 VITALS — BP 130/80 | HR 75 | Temp 97.7°F | Ht 65.0 in | Wt 256.0 lb

## 2024-02-12 DIAGNOSIS — E559 Vitamin D deficiency, unspecified: Secondary | ICD-10-CM

## 2024-02-12 DIAGNOSIS — M816 Localized osteoporosis [Lequesne]: Secondary | ICD-10-CM

## 2024-02-12 DIAGNOSIS — R229 Localized swelling, mass and lump, unspecified: Secondary | ICD-10-CM | POA: Diagnosis not present

## 2024-02-12 DIAGNOSIS — E7800 Pure hypercholesterolemia, unspecified: Secondary | ICD-10-CM

## 2024-02-12 DIAGNOSIS — G40909 Epilepsy, unspecified, not intractable, without status epilepticus: Secondary | ICD-10-CM | POA: Diagnosis not present

## 2024-02-12 DIAGNOSIS — Z0000 Encounter for general adult medical examination without abnormal findings: Secondary | ICD-10-CM | POA: Diagnosis not present

## 2024-02-12 DIAGNOSIS — E538 Deficiency of other specified B group vitamins: Secondary | ICD-10-CM | POA: Diagnosis not present

## 2024-02-12 DIAGNOSIS — Z23 Encounter for immunization: Secondary | ICD-10-CM

## 2024-02-12 LAB — COMPREHENSIVE METABOLIC PANEL WITH GFR
ALT: 21 U/L (ref 0–35)
AST: 20 U/L (ref 0–37)
Albumin: 4.4 g/dL (ref 3.5–5.2)
Alkaline Phosphatase: 89 U/L (ref 39–117)
BUN: 15 mg/dL (ref 6–23)
CO2: 29 meq/L (ref 19–32)
Calcium: 9.7 mg/dL (ref 8.4–10.5)
Chloride: 103 meq/L (ref 96–112)
Creatinine, Ser: 0.89 mg/dL (ref 0.40–1.20)
GFR: 67.95 mL/min (ref >60.00)
Glucose, Bld: 85 mg/dL (ref 70–99)
Potassium: 4.8 meq/L (ref 3.5–5.1)
Sodium: 139 meq/L (ref 135–145)
Total Bilirubin: 0.2 mg/dL (ref 0.2–1.2)
Total Protein: 7.7 g/dL (ref 6.0–8.3)

## 2024-02-12 LAB — CBC WITH DIFFERENTIAL/PLATELET
Basophils Absolute: 0.1 K/uL (ref 0.0–0.1)
Basophils Relative: 1.3 % (ref 0.0–3.0)
Eosinophils Absolute: 0.2 K/uL (ref 0.0–0.7)
Eosinophils Relative: 3.4 % (ref 0.0–5.0)
HCT: 44.1 % (ref 36.0–46.0)
Hemoglobin: 14.3 g/dL (ref 12.0–15.0)
Lymphocytes Relative: 16.4 % (ref 12.0–46.0)
Lymphs Abs: 1.2 K/uL (ref 0.7–4.0)
MCHC: 32.4 g/dL (ref 30.0–36.0)
MCV: 86.4 fl (ref 78.0–100.0)
Monocytes Absolute: 0.4 K/uL (ref 0.1–1.0)
Monocytes Relative: 5.6 % (ref 3.0–12.0)
Neutro Abs: 5.3 K/uL (ref 1.4–7.7)
Neutrophils Relative %: 73.3 % (ref 43.0–77.0)
Platelets: 288 K/uL (ref 150.0–400.0)
RBC: 5.1 Mil/uL (ref 3.87–5.11)
RDW: 14.6 % (ref 11.5–15.5)
WBC: 7.2 K/uL (ref 4.0–10.5)

## 2024-02-12 LAB — LIPID PANEL
Cholesterol: 236 mg/dL — ABNORMAL HIGH (ref 0–200)
HDL: 61.5 mg/dL (ref >39.00)
LDL Cholesterol: 145 mg/dL — ABNORMAL HIGH (ref 0–99)
NonHDL: 174
Total CHOL/HDL Ratio: 4
Triglycerides: 145 mg/dL (ref 0.0–149.0)
VLDL: 29 mg/dL (ref 0.0–40.0)

## 2024-02-12 LAB — VITAMIN B12: Vitamin B-12: 989 pg/mL — ABNORMAL HIGH (ref 211–911)

## 2024-02-12 LAB — VITAMIN D 25 HYDROXY (VIT D DEFICIENCY, FRACTURES): VITD: 40.02 ng/mL (ref 30.00–100.00)

## 2024-02-12 MED ORDER — AZELASTINE HCL 0.1 % NA SOLN: 1.0000 | Freq: Two times a day (BID) | NASAL | 1 refills | Status: DC

## 2024-02-12 NOTE — Progress Notes (Addendum)
 Subjective:    Tricia [name] is a 63 y.o. female and is here for a comprehensive physical exam.  HPI  Health Maintenance Due  Topic Date Due   Medicare Annual Wellness (AWV)  Never done    Discussed the use of AI scribe software for clinical note transcription with the patient, who gave verbal consent to proceed.  History of Present Illness Tricia [name] is a 63 year old female with osteoporosis who presents for a follow-up visit.  She is scheduled to receive flu and pneumonia vaccinations today. She has osteoporosis with a T-score of -2.6 in the lumbar spine and has started taking 1200 mg of calcium daily, along with vitamin D  and Centrum Silver. She is not on osteoporosis medication. She takes phenobarbital  nightly for seizures and is considering discontinuing trazodone  due to mixed sleep quality. Zepbound  has significantly reduced her appetite, raising concerns about protein intake as she lives alone and is planning to stop. She occasionally uses sumatriptan  for headaches. She experiences pain and swelling in her right foot due to bone issues and hammer toe, with a history of foot surgery.   Health Maintenance: Immunizations -- getting flu and pneumonia shot today Colonoscopy -- UpToDate  Mammogram -- UpToDate  PAP -- reports she is seeing gynecology later this year Bone Density -- UpToDate; due in two years Diet -- working on healthier food choices as able Exercise -- going to [employer] and planning to do Personal Training soon  Sleep habits -- no major concerns, would like to wean off of trazodone  Mood -- overall stable  UTD with dentist? - yes UTD with eye doctor? - yes  Weight history: Wt Readings from Last 10 Encounters:  02/12/24 256 lb (116.1 kg)  01/08/24 252 lb 6.1 oz (114.5 kg)  01/04/24 256 lb (116.1 kg)  11/15/23 261 lb 4 oz (118.5 kg)  02/07/23 275 lb 6.1 oz (124.9 kg)  07/20/22 215 lb (97.5 kg)  02/02/22 270 lb 4 oz (122.6 kg)  01/31/21 265 lb 6.4  oz (120.4 kg)  08/11/20 246 lb (111.6 kg)  06/04/20 246 lb (111.6 kg)   Body mass index is 42.6 kg/m. Patient's last menstrual period was 12/21/2010.  Alcohol use:  reports current alcohol use.  Tobacco use:  Tobacco Use: Medium Risk (02/12/2024)   Patient History    Smoking Tobacco Use: Former    Smokeless Tobacco Use: Never    Passive Exposure: Not on file   Eligible for lung cancer screening? no     02/12/2024    9:17 AM  Depression screen PHQ 2/9  Decreased Interest 0  Down, Depressed, Hopeless 0  PHQ - 2 Score 0     Other providers/specialists: Patient Care Team: Job Lukes, [place] as PCP - General (Physician Assistant) Alix Charleston, MD (Neurosurgery) Debrah Charleston BIRCH, MD (Inactive) (Gastroenterology) Austin Ned, MD (Obstetrics and Gynecology) Dwane Lerner, MD as Attending Physician (Dermatology) Arloa Nathanel CHRISTELLA, NP (Inactive) as Nurse Practitioner (Obstetrics and Gynecology)    PMHx, SurgHx, SocialHx, Medications, and Allergies were reviewed in the Visit Navigator and updated as appropriate.   Past Medical History:  Diagnosis Date   Allergy    seasonal allergies   Cataract    not a surgical candidate at this time (06/04/2020)   Colon polyps 2006, 2011   TUBULAR ADENOMA AND HYPERPLASTIC POLYP   Esophageal stricture 2012   GERD (gastroesophageal reflux disease)    with certain foods   Glucosuria    with normal blood sugar  Hepatitis C aby neg viral copies    serology positive but neg viral copies    HLD (hyperlipidemia)    diet controlled   Jaundice due to hepatitis    hx when younger unknown cause resolved    Meningioma (HCC)    Migraine headache    some aura  period related  imitrex  helps   MVP (mitral valve prolapse)    echo 02 normal   Seizures (HCC)    last noted seizure 1998-current on med to control   Ulcer 2012     Past Surgical History:  Procedure Laterality Date   APPENDECTOMY     BREAST BIOPSY Right 11/24/2022   MM RT  BREAST BX W LOC DEV 1ST LESION IMAGE BX SPEC STEREO GUIDE 11/24/2022 [facility]   BUNIONECTOMY     laft   CHOLECYSTECTOMY  2015   Dr Sheldon   CIN D&C conization  2000   COLONOSCOPY  2016   KN-MAC-prep good (Suprep), HPP polypoid   COLONOSCOPY W/ BIOPSIES  2011   SEVERAL    CRANIOTOMY     CYSTECTOMY  1982   ESOPHAGOGASTRODUODENOSCOPY  2012   frontal meningioma resected  1998   left shoulder dislocation surgery  2005   POLYPECTOMY  2016   HPP polypoid   TUBAL LIGATION     WISDOM TOOTH EXTRACTION     WRIST SURGERY  04/21/2017   rt-cyst removal      Family History  Problem Relation Age of Onset   Colon cancer Mother 47   Colon polyps Mother 26   Lung cancer Mother    Leukemia Mother    Cancer Mother        gynecological cancer   Colon cancer Father 26   Hyperlipidemia Father    Coronary artery disease Father 76       MI   Diabetes Father        elderly   Colon polyps Father    Heart attack Father 40   Crohn's disease Daughter    Coronary artery disease Maternal Uncle 7   Colon cancer Paternal Aunt 67       and again at age 39   Colon polyps Paternal Aunt 74   Colon cancer Paternal Uncle 35   Colon polyps Paternal Uncle 42   Lung cancer Maternal Grandmother    Colon cancer Paternal Grandmother 49   Colon polyps Paternal Grandmother 70   Deep vein thrombosis Paternal Grandfather    Other Brother        transient global amnesia   Colon polyps Brother 74   Mitral valve prolapse Brother    Breast cancer Neg Hx    Stomach cancer Neg Hx    Rectal cancer Neg Hx    Esophageal cancer Neg Hx    BRCA 1/2 Neg Hx     Social History   Tobacco Use   Smoking status: Former    Current packs/day: 0.00    Types: Cigarettes    Quit date: 05/22/1973    Years since quitting: 50.7   Smokeless tobacco: Never  Vaping Use   Vaping status: Never Used  Substance Use Topics   Alcohol use: Yes    Comment: socially   Drug use: No    Review of Systems:   Review of  Systems  Constitutional:  Negative for chills, fever, malaise/fatigue and weight loss.  HENT:  Negative for hearing loss, sinus pain and sore throat.   Respiratory:  Negative for cough and hemoptysis.  Cardiovascular:  Negative for chest pain, palpitations, leg swelling and PND.  Gastrointestinal:  Negative for abdominal pain, constipation, diarrhea, heartburn, nausea and vomiting.  Genitourinary:  Negative for dysuria, frequency and urgency.  Musculoskeletal:  Negative for back pain, myalgias and neck pain.  Skin:  Negative for itching and rash.  Neurological:  Negative for dizziness, tingling, seizures and headaches.  Endo/Heme/Allergies:  Negative for polydipsia.  Psychiatric/Behavioral:  Negative for depression. The patient is not nervous/anxious.     Objective:   BP 130/80 (BP Location: Left Arm, Patient Position: Sitting, Cuff Size: Large)   Pulse 75   Temp 97.7 F (36.5 C) (Temporal)   Ht 5' 5 (1.651 m)   Wt 256 lb (116.1 kg)   LMP 12/21/2010   SpO2 95%   BMI 42.60 kg/m  Body mass index is 42.6 kg/m.   General Appearance:    Alert, cooperative, no distress, appears stated age  Head:    Normocephalic, without obvious abnormality, atraumatic  Eyes:    PERRL, conjunctiva/corneas clear, EOM's intact, fundi    benign, both eyes  Ears:    Normal TM's and external ear canals, both ears  Nose:   Nares normal, septum midline, mucosa normal, no drainage    or sinus tenderness  Throat:   Lips, mucosa, and tongue normal; teeth and gums normal  Neck:   Supple, symmetrical, trachea midline, no adenopathy;    thyroid :  no enlargement/tenderness/nodules; no carotid   bruit or JVD  Back:     Symmetric, no curvature, ROM normal, no CVA tenderness  Lungs:     Clear to auscultation bilaterally, respirations unlabored  Chest Wall:    No tenderness or deformity   Heart:    Regular rate and rhythm, S1 and S2 normal, no murmur, rub or gallop  Breast Exam:    Deferred  Abdomen:     Soft,  non-tender, bowel sounds active all four quadrants,    no masses, no organomegaly  Genitalia:    Deferred   Extremities:   Extremities normal, atraumatic, no cyanosis or edema  Pulses:   2+ and symmetric all extremities  Skin:   Skin color, texture, turgor normal, no rashes or lesions  Lymph nodes:   Cervical, supraclavicular, and axillary nodes normal  Neurologic:   CNII-XII intact, normal strength, sensation and reflexes    throughout    Assessment/Plan:   Assessment and Plan Assessment & Plan General Health Maintenance Due for flu and pneumonia vaccinations. Pap smear needed due to family history. - Administer flu and pneumonia vaccinations. - Ensure Pap smear is completed in October as scheduled.  Osteoporosis of lumbar spine Osteoporosis in lumbar spine with T-score of -2.6. Concerns about bisphosphonates due to recent ulcer and potential heartburn. Considering strength training and calcium supplementation. - Continue calcium 1200 mg daily and vitamin D  supplementation. - Encourage strength training exercises. - Provide information on bisphosphonates for review. She was asked to reach out to us  to let us  know if she would like to start this. - Reassess bone density in 2 years.  Morbid obesity On Tirzepatide  with significant appetite reduction. Concerned about protein intake and new exercise regimen. - Continue Tirzepatide  7.5 mg/0.5 mL subcutaneous once weekly. - Encourage adequate protein intake. - Monitor nutritional intake to ensure sufficient calories for exercise.  Insomnia Wishes to discontinue Trazodone  50 mg nightly. Also uses phenobarbital  and Z gummies for sleep. - Taper Trazodone  to 25 mg nightly for 2 weeks, then discontinue.  Seizure disorder On phenobarbital  for  seizure control. - Continue phenobarbital  97.2 mg at bedtime.  Vitamin D  and B12 deficienices Would like blood work updated today      Lucie Buttner, PA-C [facility]  [facility]

## 2024-02-12 NOTE — Patient Instructions (Signed)
 It was great to see you!  Take 1/2 tablet daily of trazodone  x 2 weeks then stop.  See attached information on Alendronate/Fosamax --> let me know if you are interested in starting this  Good luck with your surgeries.  Take care,  Lucie Buttner PA-C

## 2024-02-13 ENCOUNTER — Ambulatory Visit: Payer: Self-pay | Admitting: Physician Assistant

## 2024-02-14 ENCOUNTER — Encounter: Payer: Self-pay | Admitting: Physician Assistant

## 2024-02-15 ENCOUNTER — Other Ambulatory Visit: Payer: Self-pay | Admitting: Physician Assistant

## 2024-02-15 DIAGNOSIS — E7800 Pure hypercholesterolemia, unspecified: Secondary | ICD-10-CM

## 2024-02-16 ENCOUNTER — Other Ambulatory Visit: Payer: Self-pay | Admitting: Physician Assistant

## 2024-02-18 ENCOUNTER — Ambulatory Visit (INDEPENDENT_AMBULATORY_CARE_PROVIDER_SITE_OTHER): Admitting: Surgical

## 2024-02-18 ENCOUNTER — Encounter: Payer: Self-pay | Admitting: Surgical

## 2024-02-18 VITALS — BP 146/96 | HR 81 | Ht 65.0 in | Wt 253.0 lb

## 2024-02-18 DIAGNOSIS — D329 Benign neoplasm of meninges, unspecified: Secondary | ICD-10-CM

## 2024-02-18 DIAGNOSIS — I341 Nonrheumatic mitral (valve) prolapse: Secondary | ICD-10-CM

## 2024-02-18 DIAGNOSIS — R2232 Localized swelling, mass and lump, left upper limb: Secondary | ICD-10-CM

## 2024-02-18 DIAGNOSIS — E88819 Insulin resistance, unspecified: Secondary | ICD-10-CM

## 2024-02-18 DIAGNOSIS — G40909 Epilepsy, unspecified, not intractable, without status epilepticus: Secondary | ICD-10-CM

## 2024-02-18 DIAGNOSIS — R222 Localized swelling, mass and lump, trunk: Secondary | ICD-10-CM

## 2024-02-18 MED ORDER — CEPHALEXIN 500 MG PO CAPS: 500.0000 mg | ORAL_CAPSULE | Freq: Four times a day (QID) | ORAL | 0 refills | Status: AC

## 2024-02-18 MED ORDER — OXYCODONE HCL 5 MG PO TABS: 5.0000 mg | ORAL_TABLET | Freq: Four times a day (QID) | ORAL | 0 refills | Status: AC | PRN

## 2024-02-18 MED ORDER — ONDANSETRON HCL 4 MG PO TABS: 4.0000 mg | ORAL_TABLET | Freq: Three times a day (TID) | ORAL | 0 refills | Status: DC | PRN

## 2024-02-18 NOTE — Progress Notes (Addendum)
 Patient ID: Tricia [name], female    DOB: November 14, 1960, 63 y.o.   MRN: [id number]  Chief Complaint  Patient presents with   Pre-op Exam      ICD-10-CM   1. Mass of subcutaneous tissue of back  R22.2     2. Arm mass, left  R22.32     3. MVP (mitral valve prolapse)  I34.1     4. Seizure disorder (HCC)  G40.909     5. Meningioma (HCC), followed by Dr. Nudelman given phenobarbital  for post surgery seizures, controlled  D32.9     6. Insulin  resistance  E88.819        History of Present Illness: Tricia [name] is a 63 y.o.  female  with a history of back mass and left arm mass.  She presents for preoperative evaluation for upcoming procedure, excision of back mass and left arm mass, scheduled for 03/06/2024 with Dr. Lowery.  The patient has not had problems with anesthesia. No history of DVT/PE.  No family history of DVT/PE.  No family or personal history of bleeding or clotting disorders.  Patient is not currently taking any blood thinners.  No history of CVA/MI.   Summary of Previous Visit: The patient has had multiple lipomas throughout her body over the past couple of years.  She has a large 1 on her right posterior upper back area near the scapula.  It is getting larger and is around 8 x 8 cm by measurement today.  She also has a mass of the left upper arm on the medial aspect that is fairly deep and tender.  I cannot say for sure that it is a lipoma.  Because it is tender and getting larger I think an ultrasound would be a good idea.  Her past medical history is positive as noted below and she has had surgery in the past which included appendectomy and a cholecystectomy.  Job: retired  PMH Significant for: Brain tumors, foot surgery, shoulder surgery.  Hyperlipidemia, migraines, mitral valve prolapse.  Seizures.  GERD.  Of note patient has a history of atypical chest pain, has seen gastroenterology.  Has been worked up in the ER for this with CT scan suggesting mild  proximal duodenitis.  She was placed on pantoprazole  40 mg after these episodes and reported symptoms are completely better per EMR review.  Pain likely gastritis.  Patient was seen by PCP 01/08/2024 for preoperative exam.  Overall low surgical candidate risk.  Patient had a meningioma in 1998 which she had surgically removed.  She had a seizure within the 1 week after her surgery in 1998.  She has been on phenobarbital  since then and has not had any additional seizures since then.  Patient reports she has been feeling well lately, no recent changes to her health.  She denies any active cardiac or pulmonary symptoms.  She does not have any pulmonary diseases.  She does have a history of mitral valve prolapse.  She had echo, CT cardiac score and stress test in 2018 which were normal.  She is scheduled for an additional cardiac calcium CT scan prior to surgery on 02/21/2024.  This is for routine imaging, she is not having any cardiac symptoms.  Patient reports she read to the consent form today to today's appointment and understands the risks associated surgery.  Past Medical History: Allergies: Allergies  Allergen Reactions   Codeine Sulfate     REACTION: unspecified   Phenytoin Sodium Extended Rash  Current Medications:  Current Outpatient Medications:    azelastine  (ASTELIN ) 0.1 % nasal spray, Place 1 spray into both nostrils 2 (two) times daily. Use in each nostril as directed, Disp: 30 mL, Rfl: 1   B Complex-Folic Acid (B COMPLEX MAXI PO), Take 1 tablet by mouth daily in the afternoon., Disp: , Rfl:    Calcium Carbonate-Vit D-Min (CALCIUM 1200 PO), Take 1 capsule by mouth daily in the afternoon., Disp: , Rfl:    cephALEXin (KEFLEX) 500 MG capsule, Take 1 capsule (500 mg total) by mouth 4 (four) times daily for 3 days., Disp: 12 capsule, Rfl: 0   cyanocobalamin  (VITAMIN B12) 1000 MCG tablet, Take 1,000 mcg by mouth daily., Disp: , Rfl:    Multiple Vitamins-Minerals (CENTRUM SILVER  50+WOMEN) TABS, Take 1 tablet by mouth daily., Disp: , Rfl:    Omega-3 Fatty Acids (FISH OIL) 1000 MG CAPS, Take 1 capsule by mouth daily in the afternoon., Disp: , Rfl:    ondansetron  (ZOFRAN ) 4 MG tablet, Take 1 tablet (4 mg total) by mouth every 8 (eight) hours as needed for nausea or vomiting., Disp: 20 tablet, Rfl: 0   oxyCODONE (OXY IR/ROXICODONE) 5 MG immediate release tablet, Take 1 tablet (5 mg total) by mouth every 6 (six) hours as needed for up to 3 days for severe pain (pain score 7-10)., Disp: 12 tablet, Rfl: 0   pantoprazole  (PROTONIX ) 40 MG tablet, Take 1 tablet (40 mg total) by mouth daily., Disp: 30 tablet, Rfl: 3   PHENobarbital  (LUMINAL) 97.2 MG tablet, Take 97.2 mg by mouth at bedtime., Disp: , Rfl:    SUMAtriptan  (IMITREX ) 100 MG tablet, Take 1 tablet (100 mg total) by mouth once for 1 dose. May repeat in 2 hours if headache persists or recurs. (Patient taking differently: Take 100 mg by mouth as needed. May repeat in 2 hours if headache persists or recurs.), Disp: 10 tablet, Rfl: 2   tirzepatide  (ZEPBOUND ) 7.5 MG/0.5ML injection vial, INJECT 0.5 ML (7.5 MG) UNDER THE SKIN ONCE WEEKLY (0.5ML= 50 UNITS), Disp: 2 mL, Rfl: 1   traZODone  (DESYREL ) 50 MG tablet, TAKE 1/2 TO 1 TABLET BY MOUTH AT BEDTIME AS NEEDED FOR SLEEP, Disp: 90 tablet, Rfl: 1   Vitamin D , Cholecalciferol , 25 MCG (1000 UT) TABS, Take 1 tablet by mouth daily., Disp: , Rfl:   Past Medical Problems: Past Medical History:  Diagnosis Date   Allergy    seasonal allergies   Cataract    not a surgical candidate at this time (06/04/2020)   Colon polyps 2006, 2011   TUBULAR ADENOMA AND HYPERPLASTIC POLYP   Esophageal stricture 2012   GERD (gastroesophageal reflux disease)    with certain foods   Glucosuria    with normal blood sugar   Hepatitis C aby neg viral copies    serology positive but neg viral copies    HLD (hyperlipidemia)    diet controlled   Jaundice due to hepatitis    hx when younger unknown cause  resolved    Meningioma (HCC)    Migraine headache    some aura  period related  imitrex  helps   MVP (mitral valve prolapse)    echo 02 normal   Seizures (HCC)    last noted seizure 1998-current on med to control   Ulcer 2012    Past Surgical History: Past Surgical History:  Procedure Laterality Date   APPENDECTOMY     BREAST BIOPSY Right 11/24/2022   MM RT BREAST BX W LOC DEV  1ST LESION IMAGE BX SPEC STEREO GUIDE 11/24/2022 [facility]   BUNIONECTOMY     laft   CHOLECYSTECTOMY  2015   Dr Sheldon   CIN D&C conization  2000   COLONOSCOPY  2016   KN-MAC-prep good (Suprep), HPP polypoid   COLONOSCOPY W/ BIOPSIES  2011   SEVERAL    CRANIOTOMY     CYSTECTOMY  1982   ESOPHAGOGASTRODUODENOSCOPY  2012   frontal meningioma resected  1998   left shoulder dislocation surgery  2005   POLYPECTOMY  2016   HPP polypoid   TUBAL LIGATION     WISDOM TOOTH EXTRACTION     WRIST SURGERY  04/21/2017   rt-cyst removal     Social History: Social History   Socioeconomic History   Marital status: Legally Separated    Spouse name: Not on file   Number of children: 3   Years of education: Not on file   Highest education level: Not on file  Occupational History   Occupation: Secretary/administrator: [employer]  Tobacco Use   Smoking status: Former    Current packs/day: 0.00    Types: Cigarettes    Quit date: 05/22/1973    Years since quitting: 50.7   Smokeless tobacco: Never  Vaping Use   Vaping status: Never Used  Substance and Sexual Activity   Alcohol use: Yes    Comment: socially   Drug use: No   Sexual activity: Not on file  Other Topics Concern   Not on file  Social History Narrative   3 kids and 4 grandkids   1 dog   Sleep 5-7 hours   Works Designer, fashion/clothing co now Lawyer    Not currnetly working to go back soon  A year    Social Drivers of Corporate investment banker Strain: Not on file  Food Insecurity: Not on file  Transportation Needs: Not  on file  Physical Activity: Not on file  Stress: Not on file  Social Connections: Not on file  Intimate Partner Violence: Not on file    Family History: Family History  Problem Relation Age of Onset   Colon cancer Mother 55   Colon polyps Mother 71   Lung cancer Mother    Leukemia Mother    Cancer Mother        gynecological cancer   Colon cancer Father 6   Hyperlipidemia Father    Coronary artery disease Father 40       MI   Diabetes Father        elderly   Colon polyps Father    Heart attack Father 15   Crohn's disease Daughter    Coronary artery disease Maternal Uncle 30   Colon cancer Paternal Aunt 6       and again at age 22   Colon polyps Paternal Aunt 71   Colon cancer Paternal Uncle 43   Colon polyps Paternal Uncle 19   Lung cancer Maternal Grandmother    Colon cancer Paternal Grandmother 31   Colon polyps Paternal Grandmother 40   Deep vein thrombosis Paternal Grandfather    Other Brother        transient global amnesia   Colon polyps Brother 61   Mitral valve prolapse Brother    Breast cancer Neg Hx    Stomach cancer Neg Hx    Rectal cancer Neg Hx    Esophageal cancer Neg Hx    BRCA 1/2 Neg Hx  Review of Systems: Review of Systems  Constitutional: Negative.   Respiratory: Negative.    Cardiovascular: Negative.   Neurological: Negative.     Physical Exam: Vital Signs BP (!) 146/96   Pulse 81   Ht 5' 5 (1.651 m)   Wt 253 lb (114.8 kg)   LMP 12/21/2010   SpO2 96%   BMI 42.10 kg/m   Physical Exam Constitutional:      General: Not in acute distress.    Appearance: Normal appearance. Not ill-appearing.  HENT:     Head: Normocephalic and atraumatic.  Eyes:     Pupils: Pupils are equal, round Neck:     Musculoskeletal: Normal range of motion.  Cardiovascular:     Rate and Rhythm: Normal rate    Pulses: Normal pulses.  Pulmonary:     Effort: Pulmonary effort is normal. No respiratory distress.  Abdominal:     General: Abdomen is  flat. There is no distension.  Musculoskeletal: Normal range of motion.  Skin:    General: Skin is warm and dry.     Findings: No erythema or rash.  Neurological:     General: No focal deficit present.     Mental Status: Alert and oriented to person, place, and time. Mental status is at baseline.     Motor: No weakness.  Psychiatric:        Mood and Affect: Mood normal.        Behavior: Behavior normal.    Assessment/Plan: The patient is scheduled for excision of right upper back mass and left arm mass with Dr. Lowery.  Risks, benefits, and alternatives of procedure discussed, questions answered and consent obtained.    Smoking Status: Quit greater than 20 years ago  Caprini Score: 7; Risk Factors include: Age, swelling of left lower extremity, BMI > 40, and length of planned surgery. Recommendation for mechanical prophylaxis. Encourage early ambulation.   Pictures obtained: @consult  and today  Post-op Rx sent to pharmacy: Oxycodone, Zofran , Keflex  Patient was provided with the General Surgical Risk consent document and Pain Medication Agreement prior to their appointment.  They had adequate time to read through the risk consent documents and Pain Medication Agreement. We also discussed them in person together during this preop appointment. All of their questions were answered to their satisfaction.  Recommended calling if they have any further questions.  Risk consent form and Pain Medication Agreement to be scanned into patient's chart.  The risks that can be encountered with and after excision of a mass were discussed and include the following but not limited to these: bleeding, infection, delayed healing, anesthesia risks, skin sensation changes, injury to structures including nerves, blood vessels, and muscles which may be temporary or permanent, allergies to tape, suture materials and glues, blood products, topical preparations or injected agents, skin contour irregularities,  skin discoloration and swelling, deep vein thrombosis, cardiac and pulmonary complications, pain, which may persist, persistent pain, recurrence of the lesion, poor healing of the incision, possible need for revisional surgery or staged procedures.  We discussed the potential of her having a indentation of her right back or left arm from surgical procedure to removal of large masses.  We discussed her incisions/scars will be permanent and will improve with time, but she will always have a scar.  We discussed stopping her Zepbound  1 week prior to surgery.  Her last dose will be October 4 or 5.  We discussed holding all supplements 2 weeks prior to surgery.   Electronically  signed by: Donnice PARAS Adron Geisel, PA-C 02/18/2024 9:42 AM

## 2024-02-18 NOTE — H&P (View-Only) (Signed)
 Patient ID: Tricia [name], female    DOB: November 14, 1960, 63 y.o.   MRN: [id number]  Chief Complaint  Patient presents with   Pre-op Exam      ICD-10-CM   1. Mass of subcutaneous tissue of back  R22.2     2. Arm mass, left  R22.32     3. MVP (mitral valve prolapse)  I34.1     4. Seizure disorder (HCC)  G40.909     5. Meningioma (HCC), followed by Dr. Nudelman given phenobarbital  for post surgery seizures, controlled  D32.9     6. Insulin  resistance  E88.819        History of Present Illness: Tricia [name] is a 63 y.o.  female  with a history of back mass and left arm mass.  She presents for preoperative evaluation for upcoming procedure, excision of back mass and left arm mass, scheduled for 03/06/2024 with Dr. Lowery.  The patient has not had problems with anesthesia. No history of DVT/PE.  No family history of DVT/PE.  No family or personal history of bleeding or clotting disorders.  Patient is not currently taking any blood thinners.  No history of CVA/MI.   Summary of Previous Visit: The patient has had multiple lipomas throughout her body over the past couple of years.  She has a large 1 on her right posterior upper back area near the scapula.  It is getting larger and is around 8 x 8 cm by measurement today.  She also has a mass of the left upper arm on the medial aspect that is fairly deep and tender.  I cannot say for sure that it is a lipoma.  Because it is tender and getting larger I think an ultrasound would be a good idea.  Her past medical history is positive as noted below and she has had surgery in the past which included appendectomy and a cholecystectomy.  Job: retired  PMH Significant for: Brain tumors, foot surgery, shoulder surgery.  Hyperlipidemia, migraines, mitral valve prolapse.  Seizures.  GERD.  Of note patient has a history of atypical chest pain, has seen gastroenterology.  Has been worked up in the ER for this with CT scan suggesting mild  proximal duodenitis.  She was placed on pantoprazole  40 mg after these episodes and reported symptoms are completely better per EMR review.  Pain likely gastritis.  Patient was seen by PCP 01/08/2024 for preoperative exam.  Overall low surgical candidate risk.  Patient had a meningioma in 1998 which she had surgically removed.  She had a seizure within the 1 week after her surgery in 1998.  She has been on phenobarbital  since then and has not had any additional seizures since then.  Patient reports she has been feeling well lately, no recent changes to her health.  She denies any active cardiac or pulmonary symptoms.  She does not have any pulmonary diseases.  She does have a history of mitral valve prolapse.  She had echo, CT cardiac score and stress test in 2018 which were normal.  She is scheduled for an additional cardiac calcium CT scan prior to surgery on 02/21/2024.  This is for routine imaging, she is not having any cardiac symptoms.  Patient reports she read to the consent form today to today's appointment and understands the risks associated surgery.  Past Medical History: Allergies: Allergies  Allergen Reactions   Codeine Sulfate     REACTION: unspecified   Phenytoin Sodium Extended Rash  Current Medications:  Current Outpatient Medications:    azelastine  (ASTELIN ) 0.1 % nasal spray, Place 1 spray into both nostrils 2 (two) times daily. Use in each nostril as directed, Disp: 30 mL, Rfl: 1   B Complex-Folic Acid (B COMPLEX MAXI PO), Take 1 tablet by mouth daily in the afternoon., Disp: , Rfl:    Calcium Carbonate-Vit D-Min (CALCIUM 1200 PO), Take 1 capsule by mouth daily in the afternoon., Disp: , Rfl:    cephALEXin (KEFLEX) 500 MG capsule, Take 1 capsule (500 mg total) by mouth 4 (four) times daily for 3 days., Disp: 12 capsule, Rfl: 0   cyanocobalamin  (VITAMIN B12) 1000 MCG tablet, Take 1,000 mcg by mouth daily., Disp: , Rfl:    Multiple Vitamins-Minerals (CENTRUM SILVER  50+WOMEN) TABS, Take 1 tablet by mouth daily., Disp: , Rfl:    Omega-3 Fatty Acids (FISH OIL) 1000 MG CAPS, Take 1 capsule by mouth daily in the afternoon., Disp: , Rfl:    ondansetron  (ZOFRAN ) 4 MG tablet, Take 1 tablet (4 mg total) by mouth every 8 (eight) hours as needed for nausea or vomiting., Disp: 20 tablet, Rfl: 0   oxyCODONE (OXY IR/ROXICODONE) 5 MG immediate release tablet, Take 1 tablet (5 mg total) by mouth every 6 (six) hours as needed for up to 3 days for severe pain (pain score 7-10)., Disp: 12 tablet, Rfl: 0   pantoprazole  (PROTONIX ) 40 MG tablet, Take 1 tablet (40 mg total) by mouth daily., Disp: 30 tablet, Rfl: 3   PHENobarbital  (LUMINAL) 97.2 MG tablet, Take 97.2 mg by mouth at bedtime., Disp: , Rfl:    SUMAtriptan  (IMITREX ) 100 MG tablet, Take 1 tablet (100 mg total) by mouth once for 1 dose. May repeat in 2 hours if headache persists or recurs. (Patient taking differently: Take 100 mg by mouth as needed. May repeat in 2 hours if headache persists or recurs.), Disp: 10 tablet, Rfl: 2   tirzepatide  (ZEPBOUND ) 7.5 MG/0.5ML injection vial, INJECT 0.5 ML (7.5 MG) UNDER THE SKIN ONCE WEEKLY (0.5ML= 50 UNITS), Disp: 2 mL, Rfl: 1   traZODone  (DESYREL ) 50 MG tablet, TAKE 1/2 TO 1 TABLET BY MOUTH AT BEDTIME AS NEEDED FOR SLEEP, Disp: 90 tablet, Rfl: 1   Vitamin D , Cholecalciferol , 25 MCG (1000 UT) TABS, Take 1 tablet by mouth daily., Disp: , Rfl:   Past Medical Problems: Past Medical History:  Diagnosis Date   Allergy    seasonal allergies   Cataract    not a surgical candidate at this time (06/04/2020)   Colon polyps 2006, 2011   TUBULAR ADENOMA AND HYPERPLASTIC POLYP   Esophageal stricture 2012   GERD (gastroesophageal reflux disease)    with certain foods   Glucosuria    with normal blood sugar   Hepatitis C aby neg viral copies    serology positive but neg viral copies    HLD (hyperlipidemia)    diet controlled   Jaundice due to hepatitis    hx when younger unknown cause  resolved    Meningioma (HCC)    Migraine headache    some aura  period related  imitrex  helps   MVP (mitral valve prolapse)    echo 02 normal   Seizures (HCC)    last noted seizure 1998-current on med to control   Ulcer 2012    Past Surgical History: Past Surgical History:  Procedure Laterality Date   APPENDECTOMY     BREAST BIOPSY Right 11/24/2022   MM RT BREAST BX W LOC DEV  1ST LESION IMAGE BX SPEC STEREO GUIDE 11/24/2022 [facility]   BUNIONECTOMY     laft   CHOLECYSTECTOMY  2015   Dr Sheldon   CIN D&C conization  2000   COLONOSCOPY  2016   KN-MAC-prep good (Suprep), HPP polypoid   COLONOSCOPY W/ BIOPSIES  2011   SEVERAL    CRANIOTOMY     CYSTECTOMY  1982   ESOPHAGOGASTRODUODENOSCOPY  2012   frontal meningioma resected  1998   left shoulder dislocation surgery  2005   POLYPECTOMY  2016   HPP polypoid   TUBAL LIGATION     WISDOM TOOTH EXTRACTION     WRIST SURGERY  04/21/2017   rt-cyst removal     Social History: Social History   Socioeconomic History   Marital status: Legally Separated    Spouse name: Not on file   Number of children: 3   Years of education: Not on file   Highest education level: Not on file  Occupational History   Occupation: Secretary/administrator: [employer]  Tobacco Use   Smoking status: Former    Current packs/day: 0.00    Types: Cigarettes    Quit date: 05/22/1973    Years since quitting: 50.7   Smokeless tobacco: Never  Vaping Use   Vaping status: Never Used  Substance and Sexual Activity   Alcohol use: Yes    Comment: socially   Drug use: No   Sexual activity: Not on file  Other Topics Concern   Not on file  Social History Narrative   3 kids and 4 grandkids   1 dog   Sleep 5-7 hours   Works Designer, fashion/clothing co now Lawyer    Not currnetly working to go back soon  A year    Social Drivers of Corporate investment banker Strain: Not on file  Food Insecurity: Not on file  Transportation Needs: Not  on file  Physical Activity: Not on file  Stress: Not on file  Social Connections: Not on file  Intimate Partner Violence: Not on file    Family History: Family History  Problem Relation Age of Onset   Colon cancer Mother 55   Colon polyps Mother 71   Lung cancer Mother    Leukemia Mother    Cancer Mother        gynecological cancer   Colon cancer Father 6   Hyperlipidemia Father    Coronary artery disease Father 40       MI   Diabetes Father        elderly   Colon polyps Father    Heart attack Father 15   Crohn's disease Daughter    Coronary artery disease Maternal Uncle 30   Colon cancer Paternal Aunt 6       and again at age 22   Colon polyps Paternal Aunt 71   Colon cancer Paternal Uncle 43   Colon polyps Paternal Uncle 19   Lung cancer Maternal Grandmother    Colon cancer Paternal Grandmother 31   Colon polyps Paternal Grandmother 40   Deep vein thrombosis Paternal Grandfather    Other Brother        transient global amnesia   Colon polyps Brother 61   Mitral valve prolapse Brother    Breast cancer Neg Hx    Stomach cancer Neg Hx    Rectal cancer Neg Hx    Esophageal cancer Neg Hx    BRCA 1/2 Neg Hx  Review of Systems: Review of Systems  Constitutional: Negative.   Respiratory: Negative.    Cardiovascular: Negative.   Neurological: Negative.     Physical Exam: Vital Signs BP (!) 146/96   Pulse 81   Ht 5' 5 (1.651 m)   Wt 253 lb (114.8 kg)   LMP 12/21/2010   SpO2 96%   BMI 42.10 kg/m   Physical Exam Constitutional:      General: Not in acute distress.    Appearance: Normal appearance. Not ill-appearing.  HENT:     Head: Normocephalic and atraumatic.  Eyes:     Pupils: Pupils are equal, round Neck:     Musculoskeletal: Normal range of motion.  Cardiovascular:     Rate and Rhythm: Normal rate    Pulses: Normal pulses.  Pulmonary:     Effort: Pulmonary effort is normal. No respiratory distress.  Abdominal:     General: Abdomen is  flat. There is no distension.  Musculoskeletal: Normal range of motion.  Skin:    General: Skin is warm and dry.     Findings: No erythema or rash.  Neurological:     General: No focal deficit present.     Mental Status: Alert and oriented to person, place, and time. Mental status is at baseline.     Motor: No weakness.  Psychiatric:        Mood and Affect: Mood normal.        Behavior: Behavior normal.    Assessment/Plan: The patient is scheduled for excision of right upper back mass and left arm mass with Dr. Lowery.  Risks, benefits, and alternatives of procedure discussed, questions answered and consent obtained.    Smoking Status: Quit greater than 20 years ago  Caprini Score: 7; Risk Factors include: Age, swelling of left lower extremity, BMI > 40, and length of planned surgery. Recommendation for mechanical prophylaxis. Encourage early ambulation.   Pictures obtained: @consult  and today  Post-op Rx sent to pharmacy: Oxycodone, Zofran , Keflex  Patient was provided with the General Surgical Risk consent document and Pain Medication Agreement prior to their appointment.  They had adequate time to read through the risk consent documents and Pain Medication Agreement. We also discussed them in person together during this preop appointment. All of their questions were answered to their satisfaction.  Recommended calling if they have any further questions.  Risk consent form and Pain Medication Agreement to be scanned into patient's chart.  The risks that can be encountered with and after excision of a mass were discussed and include the following but not limited to these: bleeding, infection, delayed healing, anesthesia risks, skin sensation changes, injury to structures including nerves, blood vessels, and muscles which may be temporary or permanent, allergies to tape, suture materials and glues, blood products, topical preparations or injected agents, skin contour irregularities,  skin discoloration and swelling, deep vein thrombosis, cardiac and pulmonary complications, pain, which may persist, persistent pain, recurrence of the lesion, poor healing of the incision, possible need for revisional surgery or staged procedures.  We discussed the potential of her having a indentation of her right back or left arm from surgical procedure to removal of large masses.  We discussed her incisions/scars will be permanent and will improve with time, but she will always have a scar.  We discussed stopping her Zepbound  1 week prior to surgery.  Her last dose will be October 4 or 5.  We discussed holding all supplements 2 weeks prior to surgery.   Electronically  signed by: Donnice PARAS Adron Geisel, PA-C 02/18/2024 9:42 AM

## 2024-02-21 ENCOUNTER — Other Ambulatory Visit: Payer: Self-pay | Admitting: Physician Assistant

## 2024-02-21 ENCOUNTER — Ambulatory Visit (HOSPITAL_COMMUNITY)
Admission: RE | Admit: 2024-02-21 | Discharge: 2024-02-21 | Disposition: A | Discharge status: Home / Self Care | Payer: Self-pay | Source: Ambulatory Visit | Attending: Cardiology | Admitting: Cardiology

## 2024-02-21 DIAGNOSIS — E7800 Pure hypercholesterolemia, unspecified: Secondary | ICD-10-CM | POA: Insufficient documentation

## 2024-02-22 ENCOUNTER — Ambulatory Visit: Payer: Self-pay | Admitting: Physician Assistant

## 2024-02-22 DIAGNOSIS — R599 Enlarged lymph nodes, unspecified: Secondary | ICD-10-CM

## 2024-02-25 ENCOUNTER — Encounter: Payer: Self-pay | Admitting: Physician Assistant

## 2024-02-28 ENCOUNTER — Encounter (HOSPITAL_BASED_OUTPATIENT_CLINIC_OR_DEPARTMENT_OTHER): Payer: Self-pay | Admitting: Plastic Surgery

## 2024-02-29 ENCOUNTER — Other Ambulatory Visit: Payer: Self-pay | Admitting: Physician Assistant

## 2024-02-29 DIAGNOSIS — F5101 Primary insomnia: Secondary | ICD-10-CM

## 2024-03-05 ENCOUNTER — Other Ambulatory Visit: Payer: Self-pay | Admitting: Physician Assistant

## 2024-03-05 NOTE — Anesthesia Preprocedure Evaluation (Signed)
 Anesthesia Evaluation  Patient identified by MRN, date of birth, ID band Patient awake    Reviewed: Allergy & Precautions, NPO status , Patient's Chart, lab work & pertinent test results  History of Anesthesia Complications Negative for: history of anesthetic complications  Airway Mallampati: III  TM Distance: >3 FB Neck ROM: Full    Dental  (+) Dental Advisory Given   Pulmonary neg shortness of breath, neg sleep apnea, neg COPD, neg recent URI, former smoker   Pulmonary exam normal breath sounds clear to auscultation       Cardiovascular (-) hypertension(-) angina (-) Past MI, (-) Cardiac Stents and (-) CABG (-) dysrhythmias + Valvular Problems/Murmurs MVP  Rhythm:Regular Rate:Normal  HLD  CAC 0 on 02/21/2024  Stress Test 09/27/2016:  Nuclear stress EF: 57%. No wall motion abnormalities  There was no ST segment deviation noted during stress.  This is a low risk study. No ischemia.  TTE 09/28/2016: Study Conclusions   - Left ventricle: The cavity size was normal. There was moderate    focal basal hypertrophy. Systolic function was normal. The    estimated ejection fraction was in the range of 60% to 65%. Wall    motion was normal; there were no regional wall motion    abnormalities. Left ventricular diastolic function parameters    were normal.  - Mitral valve: There was trivial regurgitation.  - Left atrium: The atrium was mildly dilated. Anterior-posterior    dimension: 41 mm.  - Pulmonary arteries: PA peak pressure: 34 mm Hg (S).      Neuro/Psych  Headaches, Seizures - (x1 in 1998 after craniotomy, on phenobarbital ), Well Controlled,  Meningioma s/p craniotomy    GI/Hepatic ,GERD  Medicated,,(+) Hepatitis -, C  Endo/Other  neg diabetes  Class 3 obesity  Renal/GU negative Renal ROS     Musculoskeletal   Abdominal  (+) + obese  Peds  Hematology negative hematology ROS (+) Lab Results      Component                 Value               Date                      WBC                      7.2                 02/12/2024                HGB                      14.3                02/12/2024                HCT                      44.1                02/12/2024                MCV                      86.4                02/12/2024  PLT                      288.0               02/12/2024              Anesthesia Other Findings Last Zepbound : 02/23/2024  Reproductive/Obstetrics                              Anesthesia Physical Anesthesia Plan  ASA: 3  Anesthesia Plan: General   Post-op Pain Management: Tylenol  PO (pre-op)*   Induction: Intravenous  PONV Risk Score and Plan: 3 and Ondansetron , Dexamethasone, Midazolam and Treatment may vary due to age or medical condition  Airway Management Planned: Oral ETT  Additional Equipment:   Intra-op Plan:   Post-operative Plan: Extubation in OR  Informed Consent: I have reviewed the patients History and Physical, chart, labs and discussed the procedure including the risks, benefits and alternatives for the proposed anesthesia with the patient or authorized representative who has indicated his/her understanding and acceptance.     Dental advisory given  Plan Discussed with: CRNA and Anesthesiologist  Anesthesia Plan Comments: (Risks of general anesthesia discussed including, but not limited to, sore throat, hoarse voice, chipped/damaged teeth, injury to vocal cords, nausea and vomiting, allergic reactions, lung infection, heart attack, stroke, and death. All questions answered. )         Anesthesia Quick Evaluation

## 2024-03-06 ENCOUNTER — Encounter (HOSPITAL_BASED_OUTPATIENT_CLINIC_OR_DEPARTMENT_OTHER): Payer: Self-pay | Admitting: Plastic Surgery

## 2024-03-06 ENCOUNTER — Ambulatory Visit (HOSPITAL_BASED_OUTPATIENT_CLINIC_OR_DEPARTMENT_OTHER)
Admission: RE | Admit: 2024-03-06 | Discharge: 2024-03-06 | Disposition: A | Discharge status: Home / Self Care | Attending: Plastic Surgery | Admitting: Plastic Surgery

## 2024-03-06 ENCOUNTER — Other Ambulatory Visit: Payer: Self-pay

## 2024-03-06 ENCOUNTER — Ambulatory Visit (HOSPITAL_BASED_OUTPATIENT_CLINIC_OR_DEPARTMENT_OTHER): Payer: Self-pay | Admitting: Anesthesiology

## 2024-03-06 ENCOUNTER — Encounter (HOSPITAL_BASED_OUTPATIENT_CLINIC_OR_DEPARTMENT_OTHER)
Admission: RE | Disposition: A | Discharge status: Home / Self Care | Payer: Self-pay | Source: Home / Self Care | Attending: Plastic Surgery

## 2024-03-06 DIAGNOSIS — E785 Hyperlipidemia, unspecified: Secondary | ICD-10-CM | POA: Diagnosis not present

## 2024-03-06 DIAGNOSIS — Z6841 Body Mass Index (BMI) 40.0 and over, adult: Secondary | ICD-10-CM | POA: Diagnosis not present

## 2024-03-06 DIAGNOSIS — Z79899 Other long term (current) drug therapy: Secondary | ICD-10-CM | POA: Insufficient documentation

## 2024-03-06 DIAGNOSIS — Z86011 Personal history of benign neoplasm of the brain: Secondary | ICD-10-CM | POA: Insufficient documentation

## 2024-03-06 DIAGNOSIS — D1722 Benign lipomatous neoplasm of skin and subcutaneous tissue of left arm: Secondary | ICD-10-CM | POA: Diagnosis not present

## 2024-03-06 DIAGNOSIS — G40909 Epilepsy, unspecified, not intractable, without status epilepticus: Secondary | ICD-10-CM | POA: Insufficient documentation

## 2024-03-06 DIAGNOSIS — Z87891 Personal history of nicotine dependence: Secondary | ICD-10-CM | POA: Diagnosis not present

## 2024-03-06 DIAGNOSIS — K219 Gastro-esophageal reflux disease without esophagitis: Secondary | ICD-10-CM | POA: Diagnosis not present

## 2024-03-06 DIAGNOSIS — E66813 Obesity, class 3: Secondary | ICD-10-CM | POA: Diagnosis not present

## 2024-03-06 DIAGNOSIS — R222 Localized swelling, mass and lump, trunk: Secondary | ICD-10-CM | POA: Diagnosis present

## 2024-03-06 DIAGNOSIS — E88819 Insulin resistance, unspecified: Secondary | ICD-10-CM | POA: Diagnosis not present

## 2024-03-06 DIAGNOSIS — I341 Nonrheumatic mitral (valve) prolapse: Secondary | ICD-10-CM | POA: Insufficient documentation

## 2024-03-06 DIAGNOSIS — D171 Benign lipomatous neoplasm of skin and subcutaneous tissue of trunk: Secondary | ICD-10-CM | POA: Insufficient documentation

## 2024-03-06 DIAGNOSIS — Z01818 Encounter for other preprocedural examination: Secondary | ICD-10-CM

## 2024-03-06 DIAGNOSIS — Z8619 Personal history of other infectious and parasitic diseases: Secondary | ICD-10-CM | POA: Diagnosis not present

## 2024-03-06 DIAGNOSIS — R2232 Localized swelling, mass and lump, left upper limb: Secondary | ICD-10-CM | POA: Diagnosis present

## 2024-03-06 SURGERY — EXCISION MASS, BACK
Anesthesia: General | Site: Back | Laterality: Right

## 2024-03-06 MED ORDER — ROCURONIUM BROMIDE 10 MG/ML (PF) SYRINGE
PREFILLED_SYRINGE | INTRAVENOUS | Status: AC
Start: 2024-03-06 — End: 2024-03-06
  Filled 2024-03-06: qty 10

## 2024-03-06 MED ORDER — ROCURONIUM BROMIDE 100 MG/10ML IV SOLN
INTRAVENOUS | Status: DC | PRN
  Administered 2024-03-06: 60 mg via INTRAVENOUS

## 2024-03-06 MED ORDER — LACTATED RINGERS IV SOLN: INTRAVENOUS | Status: DC

## 2024-03-06 MED ORDER — LIDOCAINE 2% (20 MG/ML) 5 ML SYRINGE
INTRAMUSCULAR | Status: AC
  Filled 2024-03-06: qty 5

## 2024-03-06 MED ORDER — BUPIVACAINE LIPOSOME 1.3 % IJ SUSP
INTRAMUSCULAR | Status: AC
  Filled 2024-03-06: qty 10

## 2024-03-06 MED ORDER — ONDANSETRON HCL 4 MG/2ML IJ SOLN
INTRAMUSCULAR | Status: AC
  Filled 2024-03-06: qty 2

## 2024-03-06 MED ORDER — DEXAMETHASONE SODIUM PHOSPHATE 4 MG/ML IJ SOLN
INTRAMUSCULAR | Status: DC | PRN
  Administered 2024-03-06: 5 mg via INTRAVENOUS

## 2024-03-06 MED ORDER — OXYCODONE HCL 5 MG/5ML PO SOLN: 5.0000 mg | Freq: Once | ORAL | Status: AC | PRN

## 2024-03-06 MED ORDER — PHENYLEPHRINE 80 MCG/ML (10ML) SYRINGE FOR IV PUSH (FOR BLOOD PRESSURE SUPPORT)
PREFILLED_SYRINGE | INTRAVENOUS | Status: AC
  Filled 2024-03-06: qty 10

## 2024-03-06 MED ORDER — OXYCODONE HCL 5 MG PO TABS
ORAL_TABLET | ORAL | Status: AC
  Filled 2024-03-06: qty 1

## 2024-03-06 MED ORDER — ACETAMINOPHEN 325 MG PO TABS
650.0000 mg | ORAL_TABLET | ORAL | Status: DC | PRN
Start: 2024-03-06 — End: 2024-03-06

## 2024-03-06 MED ORDER — ACETAMINOPHEN 500 MG PO TABS
1000.0000 mg | ORAL_TABLET | Freq: Once | ORAL | Status: AC
  Administered 2024-03-06: 1000 mg via ORAL

## 2024-03-06 MED ORDER — FENTANYL CITRATE (PF) 100 MCG/2ML IJ SOLN
INTRAMUSCULAR | Status: AC
  Filled 2024-03-06: qty 2

## 2024-03-06 MED ORDER — CEFAZOLIN SODIUM-DEXTROSE 2-4 GM/100ML-% IV SOLN
INTRAVENOUS | Status: AC
  Filled 2024-03-06: qty 100

## 2024-03-06 MED ORDER — ONDANSETRON HCL 4 MG/2ML IJ SOLN
INTRAMUSCULAR | Status: DC | PRN
  Administered 2024-03-06: 4 mg via INTRAVENOUS

## 2024-03-06 MED ORDER — CEFAZOLIN SODIUM-DEXTROSE 2-4 GM/100ML-% IV SOLN
2.0000 g | INTRAVENOUS | Status: AC
Start: 2024-03-06 — End: 2024-03-06
  Administered 2024-03-06: 2 g via INTRAVENOUS

## 2024-03-06 MED ORDER — EPHEDRINE SULFATE (PRESSORS) 50 MG/ML IJ SOLN
INTRAMUSCULAR | Status: DC | PRN
  Administered 2024-03-06: 10 mg via INTRAVENOUS

## 2024-03-06 MED ORDER — SUGAMMADEX SODIUM 200 MG/2ML IV SOLN
INTRAVENOUS | Status: DC | PRN
  Administered 2024-03-06: 250 mg via INTRAVENOUS

## 2024-03-06 MED ORDER — ATROPINE SULFATE 0.4 MG/ML IV SOLN
INTRAVENOUS | Status: AC
  Filled 2024-03-06: qty 1

## 2024-03-06 MED ORDER — FENTANYL CITRATE (PF) 100 MCG/2ML IJ SOLN: 25.0000 ug | INTRAMUSCULAR | Status: DC | PRN

## 2024-03-06 MED ORDER — SODIUM CHLORIDE 0.9% FLUSH: 3.0000 mL | INTRAVENOUS | Status: DC | PRN

## 2024-03-06 MED ORDER — PROPOFOL 10 MG/ML IV BOLUS
INTRAVENOUS | Status: DC | PRN
  Administered 2024-03-06: 200 mg via INTRAVENOUS

## 2024-03-06 MED ORDER — CHLORHEXIDINE GLUCONATE CLOTH 2 % EX PADS
6.0000 | MEDICATED_PAD | Freq: Once | CUTANEOUS | Status: AC
  Administered 2024-03-06: 6 via TOPICAL

## 2024-03-06 MED ORDER — LIDOCAINE-EPINEPHRINE 1 %-1:100000 IJ SOLN
INTRAMUSCULAR | Status: DC | PRN
  Administered 2024-03-06: 10 mL

## 2024-03-06 MED ORDER — ACETAMINOPHEN 325 MG RE SUPP: 650.0000 mg | RECTAL | Status: DC | PRN

## 2024-03-06 MED ORDER — OXYCODONE HCL 5 MG PO TABS
5.0000 mg | ORAL_TABLET | Freq: Once | ORAL | Status: AC | PRN
  Administered 2024-03-06: 5 mg via ORAL

## 2024-03-06 MED ORDER — FENTANYL CITRATE (PF) 100 MCG/2ML IJ SOLN
INTRAMUSCULAR | Status: DC | PRN
  Administered 2024-03-06 (×2): 50 ug via INTRAVENOUS

## 2024-03-06 MED ORDER — MIDAZOLAM HCL 5 MG/5ML IJ SOLN
INTRAMUSCULAR | Status: DC | PRN
  Administered 2024-03-06: 2 mg via INTRAVENOUS

## 2024-03-06 MED ORDER — SODIUM CHLORIDE 0.9% FLUSH: 3.0000 mL | Freq: Two times a day (BID) | INTRAVENOUS | Status: DC

## 2024-03-06 MED ORDER — MIDAZOLAM HCL 2 MG/2ML IJ SOLN
INTRAMUSCULAR | Status: AC
Start: 2024-03-06 — End: 2024-03-06
  Filled 2024-03-06: qty 2

## 2024-03-06 MED ORDER — PHENYLEPHRINE HCL (PRESSORS) 10 MG/ML IV SOLN
INTRAVENOUS | Status: DC | PRN
  Administered 2024-03-06: 80 ug via INTRAVENOUS

## 2024-03-06 MED ORDER — SODIUM CHLORIDE 0.9 % IV SOLN: 250.0000 mL | INTRAVENOUS | Status: DC | PRN

## 2024-03-06 MED ORDER — 0.9 % SODIUM CHLORIDE (POUR BTL) OPTIME
TOPICAL | Status: DC | PRN
  Administered 2024-03-06: 1000 mL

## 2024-03-06 MED ORDER — BUPIVACAINE LIPOSOME 1.3 % IJ SUSP
INTRAMUSCULAR | Status: DC | PRN
  Administered 2024-03-06: 10 mL

## 2024-03-06 MED ORDER — OXYCODONE HCL 5 MG PO TABS: 5.0000 mg | ORAL_TABLET | ORAL | Status: DC | PRN

## 2024-03-06 MED ORDER — ACETAMINOPHEN 500 MG PO TABS
ORAL_TABLET | ORAL | Status: AC
  Filled 2024-03-06: qty 2

## 2024-03-06 MED ORDER — AMISULPRIDE (ANTIEMETIC) 5 MG/2ML IV SOLN: 10.0000 mg | Freq: Once | INTRAVENOUS | Status: DC | PRN

## 2024-03-06 MED ORDER — EPHEDRINE 5 MG/ML INJ
INTRAVENOUS | Status: AC
  Filled 2024-03-06: qty 5

## 2024-03-06 MED ORDER — SUCCINYLCHOLINE CHLORIDE 200 MG/10ML IV SOSY
PREFILLED_SYRINGE | INTRAVENOUS | Status: AC
  Filled 2024-03-06: qty 10

## 2024-03-06 SURGICAL SUPPLY — 64 items
BAND RUBBER #18 3X1/16 STRL (MISCELLANEOUS) IMPLANT
BENZOIN TINCTURE PRP APPL 2/3 (GAUZE/BANDAGES/DRESSINGS) IMPLANT
BLADE CLIPPER SURG (BLADE) IMPLANT
BLADE SURG 15 STRL LF DISP TIS (BLADE) ×2 IMPLANT
BNDG ELASTIC 2INX 5YD STR LF (GAUZE/BANDAGES/DRESSINGS) IMPLANT
CANISTER SUCT 1200ML W/VALVE (MISCELLANEOUS) IMPLANT
CLEANER CAUTERY TIP PAD (MISCELLANEOUS) IMPLANT
CORD BIPOLAR FORCEPS 12FT (ELECTRODE) IMPLANT
COVER BACK TABLE 60X90IN (DRAPES) ×2 IMPLANT
COVER MAYO STAND STRL (DRAPES) ×2 IMPLANT
DERMABOND ADVANCED .7 DNX12 (GAUZE/BANDAGES/DRESSINGS) IMPLANT
DRAPE LAPAROSCOPIC ABDOMINAL (DRAPES) IMPLANT
DRAPE LAPAROTOMY 100X72 PEDS (DRAPES) IMPLANT
DRAPE U-SHAPE 76X120 STRL (DRAPES) ×2 IMPLANT
DRESSING MEPILEX FLEX 4X4 (GAUZE/BANDAGES/DRESSINGS) IMPLANT
DRSG TEGADERM 2-3/8X2-3/4 SM (GAUZE/BANDAGES/DRESSINGS) IMPLANT
DRSG TEGADERM 4X4.75 (GAUZE/BANDAGES/DRESSINGS) IMPLANT
ELECT COATED BLADE 2.86 ST (ELECTRODE) IMPLANT
ELECT NDL BLADE 2-5/6 (NEEDLE) ×2 IMPLANT
ELECT NEEDLE BLADE 2-5/6 (NEEDLE) IMPLANT
ELECTRODE REM PT RETRN 9FT PED (ELECTROSURGICAL) IMPLANT
ELECTRODE REM PT RTRN 9FT ADLT (ELECTROSURGICAL) IMPLANT
GAUZE SPONGE 2X2 STRL 8-PLY (GAUZE/BANDAGES/DRESSINGS) IMPLANT
GAUZE SPONGE 4X4 12PLY STRL LF (GAUZE/BANDAGES/DRESSINGS) IMPLANT
GAUZE STRETCH 2X75IN STRL (MISCELLANEOUS) IMPLANT
GAUZE XEROFORM 1X8 LF (GAUZE/BANDAGES/DRESSINGS) IMPLANT
GLOVE BIO SURGEON STRL SZ 6.5 (GLOVE) ×4 IMPLANT
GLOVE BIOGEL M STRL SZ7.5 (GLOVE) ×2 IMPLANT
GOWN STRL REUS W/ TWL LRG LVL3 (GOWN DISPOSABLE) ×4 IMPLANT
GOWN STRL REUS W/ TWL XL LVL3 (GOWN DISPOSABLE) ×2 IMPLANT
NDL HYPO 25GX1X1/2 BEV (NEEDLE) IMPLANT
NDL HYPO 30GX1 BEV (NEEDLE) ×2 IMPLANT
NDL PRECISIONGLIDE 27X1.5 (NEEDLE) ×2 IMPLANT
NEEDLE HYPO 25GX1X1/2 BEV (NEEDLE) ×4 IMPLANT
NEEDLE HYPO 30GX1 BEV (NEEDLE) IMPLANT
NEEDLE PRECISIONGLIDE 27X1.5 (NEEDLE) ×2 IMPLANT
NS IRRIG 1000ML POUR BTL (IV SOLUTION) IMPLANT
PACK BASIN DAY SURGERY FS (CUSTOM PROCEDURE TRAY) ×2 IMPLANT
PENCIL SMOKE EVACUATOR (MISCELLANEOUS) IMPLANT
SHEET MEDIUM DRAPE 40X70 STRL (DRAPES) IMPLANT
SLEEVE SCD COMPRESS KNEE MED (STOCKING) IMPLANT
SOL PREP POV-IOD 4OZ 10% (MISCELLANEOUS) IMPLANT
SPIKE FLUID TRANSFER (MISCELLANEOUS) ×2 IMPLANT
SPONGE T-LAP 18X18 ~~LOC~~+RFID (SPONGE) IMPLANT
STRIP CLOSURE SKIN 1/2X4 (GAUZE/BANDAGES/DRESSINGS) IMPLANT
SUCTION TUBE FRAZIER 10FR DISP (SUCTIONS) IMPLANT
SUT MNCRL 6-0 UNDY P1 1X18 (SUTURE) IMPLANT
SUT MNCRL AB 3-0 PS2 18 (SUTURE) IMPLANT
SUT MNCRL AB 4-0 PS2 18 (SUTURE) IMPLANT
SUT MON AB 5-0 P3 18 (SUTURE) IMPLANT
SUT MON AB 5-0 PS2 18 (SUTURE) IMPLANT
SUT PDS II 3-0 CT2 27 ABS (SUTURE) IMPLANT
SUT PROLENE 5 0 P 3 (SUTURE) IMPLANT
SUT PROLENE 5 0 PS 2 (SUTURE) IMPLANT
SUT PROLENE 6 0 P 1 18 (SUTURE) IMPLANT
SUT VIC AB 4-0 PS2 18 (SUTURE) IMPLANT
SUT VIC AB 5-0 P-3 18X BRD (SUTURE) IMPLANT
SUT VIC AB 5-0 PS2 18 (SUTURE) IMPLANT
SYR 10ML LL (SYRINGE) IMPLANT
SYR BULB EAR ULCER 3OZ GRN STR (SYRINGE) IMPLANT
SYR CONTROL 10ML LL (SYRINGE) ×2 IMPLANT
TOWEL GREEN STERILE FF (TOWEL DISPOSABLE) ×2 IMPLANT
TRAY DSU PREP LF (CUSTOM PROCEDURE TRAY) ×2 IMPLANT
TUBE CONNECTING 20X1/4 (TUBING) IMPLANT

## 2024-03-06 NOTE — Anesthesia Postprocedure Evaluation (Signed)
 Anesthesia Post Note  Patient: [name]  Procedure(s) Performed: EXCISION MASS, BACK (Right: Back) EXCISION MASS UPPER EXTREMITIES (Left: Arm Upper)     Patient location during evaluation: PACU Anesthesia Type: General Level of consciousness: awake Pain management: pain level controlled Vital Signs Assessment: post-procedure vital signs reviewed and stable Respiratory status: spontaneous breathing, nonlabored ventilation and respiratory function stable Cardiovascular status: blood pressure returned to baseline and stable Postop Assessment: no apparent nausea or vomiting Anesthetic complications: no   No notable events documented.  Last Vitals:  Vitals:   03/06/24 1030 03/06/24 1045  BP: 109/88 123/76  Pulse: 87 71  Resp: 14 13  Temp:    SpO2: 96% 97%    Last Pain:  Vitals:   03/06/24 1045  TempSrc:   PainSc: 0-No pain                 [name]

## 2024-03-06 NOTE — Discharge Instructions (Addendum)
 No Tylenol  until 1:30 pm  Oxycodone given at 11:30 a.m  Post Anesthesia Home Care Instructions  Activity: Get plenty of rest for the remainder of the day. A responsible individual must stay with you for 24 hours following the procedure.  For the next 24 hours, DO NOT: -Drive a car -Advertising copywriter -Drink alcoholic beverages -Take any medication unless instructed by your physician -Make any legal decisions or sign important papers.  Meals: Start with liquid foods such as gelatin or soup. Progress to regular foods as tolerated. Avoid greasy, spicy, heavy foods. If nausea and/or vomiting occur, drink only clear liquids until the nausea and/or vomiting subsides. Call your physician if vomiting continues.  Special Instructions/Symptoms: Your throat may feel dry or sore from the anesthesia or the breathing tube placed in your throat during surgery. If this causes discomfort, gargle with warm salt water. The discomfort should disappear within 24 hours.  If you had a scopolamine patch placed behind your ear for the management of post- operative nausea and/or vomiting:  1. The medication in the patch is effective for 72 hours, after which it should be removed.  Wrap patch in a tissue and discard in the trash. Wash hands thoroughly with soap and water. 2. You may remove the patch earlier than 72 hours if you experience unpleasant side effects which may include dry mouth, dizziness or visual disturbances. 3. Avoid touching the patch. Wash your hands with soap and water after contact with the patch.   Information for Discharge Teaching: EXPAREL (bupivacaine liposome injectable suspension)   Pain relief is important to your recovery. The goal is to control your pain so you can move easier and return to your normal activities as soon as possible after your procedure. Your physician may use several types of medicines to manage pain, swelling, and more.  Your surgeon or anesthesiologist gave you  EXPAREL(bupivacaine) to help control your pain after surgery.  EXPAREL is a local anesthetic designed to release slowly over an extended period of time to provide pain relief by numbing the tissue around the surgical site. EXPAREL is designed to release pain medication over time and can control pain for up to 72 hours. Depending on how you respond to EXPAREL, you may require less pain medication during your recovery. EXPAREL can help reduce or eliminate the need for opioids during the first few days after surgery when pain relief is needed the most. EXPAREL is not an opioid and is not addictive. It does not cause sleepiness or sedation.   Important! A teal colored band has been placed on your arm with the date, time and amount of EXPAREL you have received. Please leave this armband in place for the full 96 hours following administration, and then you may remove the band. If you return to the hospital for any reason within 96 hours following the administration of EXPAREL, the armband provides important information that your health care providers to know, and alerts them that you have received this anesthetic.    Possible side effects of EXPAREL: Temporary loss of sensation or ability to move in the area where medication was injected. Nausea, vomiting, constipation Rarely, numbness and tingling in your mouth or lips, lightheadedness, or anxiety may occur. Call your doctor right away if you think you may be experiencing any of these sensations, or if you have other questions regarding possible side effects.  Follow all other discharge instructions given to you by your surgeon or nurse. Eat a healthy diet and drink plenty  of water or other fluids.

## 2024-03-06 NOTE — Anesthesia Procedure Notes (Signed)
 Procedure Name: Intubation Date/Time: 03/06/2024 10:12 AM  Performed by: Emilio Rock BIRCH, CRNAPre-anesthesia Checklist: Patient identified, Emergency Drugs available, Suction available and Patient being monitored Patient Re-evaluated:Patient Re-evaluated prior to induction Oxygen Delivery Method: Circle system utilized Preoxygenation: Pre-oxygenation with 100% oxygen Induction Type: IV induction Ventilation: Mask ventilation without difficulty Laryngoscope Size: Mac and 3 Grade View: Grade I Tube type: Oral Tube size: 7.0 mm Number of attempts: 1 Airway Equipment and Method: Stylet and Oral airway Placement Confirmation: ETT inserted through vocal cords under direct vision, positive ETCO2 and breath sounds checked- equal and bilateral Secured at: 22 cm Tube secured with: Tape Dental Injury: Teeth and Oropharynx as per pre-operative assessment

## 2024-03-06 NOTE — Transfer of Care (Signed)
 Immediate Anesthesia Transfer of Care Note  Patient: Tricia [name]  Procedure(s) Performed: EXCISION MASS, BACK (Right: Back) EXCISION MASS UPPER EXTREMITIES (Left: Arm Upper)  Patient Location: PACU  Anesthesia Type:General  Level of Consciousness: awake, alert , oriented, drowsy, and patient cooperative  Airway & Oxygen Therapy: Patient Spontanous Breathing and Patient connected to face mask oxygen  Post-op Assessment: Report given to RN and Post -op Vital signs reviewed and stable  Post vital signs: Reviewed and stable  Last Vitals:  Vitals Value Taken Time  BP    Temp    Pulse    Resp    SpO2      Last Pain:  Vitals:   03/06/24 0734  TempSrc:   PainSc: 0-No pain      Patients Stated Pain Goal: 4 (03/06/24 0730)  Complications: No notable events documented.

## 2024-03-06 NOTE — Op Note (Signed)
 DATE OF OPERATION: 03/06/2024  LOCATION: [facility]  PREOPERATIVE DIAGNOSIS:  Left arm mass x 2 Right back mass  POSTOPERATIVE DIAGNOSIS: Same  PROCEDURE:  Excision of left arm lipoma 7 x 7 cm and 2 x 2 cm deep subcutaneous tissue Excision of right back lipoma 7 x 10 cm on bone  SURGEON: Estefana Fritter, DO  ASSISTANT: Estefana Peck, PA  EBL: 5 cc  CONDITION: Stable  COMPLICATIONS: None  INDICATION: The patient, Tricia [name], is a 63 y.o. female born on [birth year], is here for treatment oif two masses in the left posterior arm and a large mass in the right back area.  The back mass was difficult to find and depended on positioning.    PROCEDURE DETAILS:  The patient was seen prior to surgery and marked.  The IV antibiotics were given. The patient was taken to the operating room and given a general anesthetic. A standard time out was performed and all information was confirmed by those in the room. SCDs were placed.   The patient was given general anesthesia and then placed in the prone position.  The patient was prepped and draped.  Local was placed in the skin of each site.  Left arm: The #15 blade was used to make an incision in the arm in the vertical direction 2 cm in size.  The bovie was used to dissect to the mass.  The mass was a lipoma and was in the deep portion of the subcutaneous tissue.  It was 7 x 7 cm in size.  Once the larger one was removed I was able to see the smaller 2 x 2 cm mass.  This was removed.  Experel was placed.  The deep layers were closed with the 5-0 Monocryl and then the skin with the same stitch.  Derma bond and steri strips were applied.  Right back: The #15 blade was used to make an incision in the back.  The bovie was used to dissect to the muscle.  There was a bulge noted under the muscle.  Hemostats were used to separate the fibers of the muscle without cutting it.  At this point I was able to see the bulge of the lipoma.   The bovie was used to cut the capsule thin tissue.  The lipoma was then teased out and was 7 x 10 cm in size.  It was attached to the scapula.  Hemostasis was achieved with electrocautery.  The muscle fibers were re approximated with the 4-0 Vicryl.  The deep layers were closed with the 3-0 Moncoryl and then the skin with the same.  Experel was placed in the muscle. Derma bond and steri strips were applied with a sterile dressing. The patient was allowed to wake up and taken to recovery room in stable condition at the end of the case. The family was notified at the end of the case.   The advanced practice practitioner (APP) assisted throughout the case.  The APP was essential in retraction and counter traction when needed to make the case progress smoothly.  This retraction and assistance made it possible to see the tissue plans for the procedure.  The assistance was needed for blood control, tissue re-approximation and assisted with closure of the incision site.

## 2024-03-06 NOTE — Interval H&P Note (Signed)
 History and Physical Interval Note:  03/06/2024 8:25 AM  Tricia [name]  has presented today for surgery, with the diagnosis of lipoma.  The various methods of treatment have been discussed with the patient and family. After consideration of risks, benefits and other options for treatment, the patient has consented to  Procedure(s) with comments: EXCISION MASS, BACK (Right) - Excision of back mass, left arm EXCISION MASS UPPER EXTREMITIES (Left) as a surgical intervention.  The patient's history has been reviewed, patient examined, no change in status, stable for surgery.  I have reviewed the patient's chart and labs.  Questions were answered to the patient's satisfaction.     Estefana RAMAN Ashika Apuzzo

## 2024-03-07 ENCOUNTER — Encounter (HOSPITAL_BASED_OUTPATIENT_CLINIC_OR_DEPARTMENT_OTHER): Payer: Self-pay | Admitting: Plastic Surgery

## 2024-03-07 LAB — SURGICAL PATHOLOGY

## 2024-03-12 ENCOUNTER — Telehealth: Payer: Self-pay

## 2024-03-12 NOTE — Telephone Encounter (Signed)
 Returned patients call. Patient indicated her bandage was falling off from surgery.  Her daughter who is a Engineer, civil (consulting) bought some more and covered it up over the current wound covering. Advised patient should she have any ore problems, to please call our office back.  Otherwise we will see her at her follow up this Friday.

## 2024-03-13 NOTE — Progress Notes (Signed)
 Patient is a 63 year old female who recently underwent excision of left arm lipoma and deep subcutaneous tissue as well as excision of right back lipoma with Dr. Lowery on 03/06/2024.  Patient is about 1 week postop.  She presents to the clinic today for postoperative follow-up.  Per chart review, both lesions were consistent with lipoma.  Today, patient reports she is overall doing well.  She denies any fevers or chills.  She reports that sometimes the surgical sites get itchy, but it is not constant.  She denies any drainage from either of the surgical sites.  She denies any other issues or concerns.  Discussed the results of the pathology with the patient.  She expressed understanding.  Chaperone present on exam.  On exam, patient is sitting upright in no acute distress.  To the left arm, incision appears to be intact with Steri-Strips.  There is some surrounding mild ecchymosis.  There are no obvious fluid collections palpated on exam.  There is a little bit of firmness consistent with scar tissue noted a few centimeters from the incision.  There is no erythema or drainage.  To the back, incision is intact with Steri-Strip.  There is no surrounding erythema or drainage.  No fluid collection palpated on exam.  No signs of infection on exam.  Discussed with the patient that we will leave the Steri-Strips in place for now.  Discussed with her that if she continues to itch, she may take Benadryl at night or Claritin during the day.  Discussed with her to continue to monitor the sites and let us  know if she is having any worsening symptoms or concerns.  She expressed understanding.  Discussed with her she may cover the area for comfort if she would like.  Discussed with her she may gently massage to the area of firmness a few centimeters out from the left arm incision.  She expressed understanding.  Patient to follow-up at her next scheduled appointment.  Instructed her to call if she has any  questions or concerns about anything.

## 2024-03-14 ENCOUNTER — Ambulatory Visit (INDEPENDENT_AMBULATORY_CARE_PROVIDER_SITE_OTHER): Admitting: Student

## 2024-03-14 ENCOUNTER — Encounter: Payer: Self-pay | Admitting: Student

## 2024-03-14 VITALS — BP 144/85 | HR 83 | Ht 65.0 in | Wt 250.0 lb

## 2024-03-14 DIAGNOSIS — R222 Localized swelling, mass and lump, trunk: Secondary | ICD-10-CM

## 2024-03-25 ENCOUNTER — Ambulatory Visit (INDEPENDENT_AMBULATORY_CARE_PROVIDER_SITE_OTHER): Admitting: Plastic Surgery

## 2024-03-25 VITALS — BP 127/82 | HR 78

## 2024-03-25 DIAGNOSIS — R222 Localized swelling, mass and lump, trunk: Secondary | ICD-10-CM

## 2024-03-25 DIAGNOSIS — Z9889 Other specified postprocedural states: Secondary | ICD-10-CM

## 2024-03-25 NOTE — Progress Notes (Signed)
 The patient is a 63 year old female here for follow-up after undergoing excision of lipoma of her back and left arm.  The pathology showed lipoma.  The patient said she had a lot of bruising of her arm.  Overall she is doing really well.  No sign of infection and no hematoma or seroma.  I am going to leave everything alone and we will plan to remove the Steri-Strips and stitches at her next visit.  Pictures at next visit.

## 2024-03-27 ENCOUNTER — Encounter: Payer: Self-pay | Admitting: Physician Assistant

## 2024-04-04 ENCOUNTER — Ambulatory Visit (INDEPENDENT_AMBULATORY_CARE_PROVIDER_SITE_OTHER): Admitting: Student

## 2024-04-04 VITALS — BP 132/87 | HR 77

## 2024-04-04 DIAGNOSIS — R2232 Localized swelling, mass and lump, left upper limb: Secondary | ICD-10-CM

## 2024-04-04 DIAGNOSIS — R222 Localized swelling, mass and lump, trunk: Secondary | ICD-10-CM

## 2024-04-04 NOTE — Progress Notes (Signed)
 Patient is a 63 year old female who recently underwent excision of left arm lipoma and deep subcutaneous tissue as well as excision of right back lipoma with Dr. Lowery on 03/06/2024.  Patient is about 4 weeks postop.  She presents to the clinic today for postoperative follow-up.  Patient was last in the clinic on 03/25/2024.  At this visit, the patient reported she had a lot of bruising of her arm.  She is overall doing well.  There is no sign of hematoma or seroma.  There is no sign of infection.  Plan was to leave everything in place and remove the Steri-Strips and sutures at her next visit.  Today, patient reports she is doing well.  She states that her back is a little bit sore, but she states that it is tolerable.  She states that she has not had to take any Tylenol  or ibuprofen for this.  She otherwise denies any other issues or concerns.  She does not report any fevers or chills.  Chaperone present on exam.  On exam, patient is sitting upright in no acute distress.  Steri-Strip is in place to the incision to the left arm.  This was removed.  Incision appears to be well-healed.  There were a few Monocryl sutures that were cut and removed.  Patient tolerated well.  There does appear to be a little bit of firmness underneath the incision consistent with scar tissue.  There is no obvious fluid collection noted.  No surrounding erythema.  Steri-Strip is in place to the incision on the back.  This was removed.  Incision appears to be well-healed.  There does appear to be a little bit of irritation around the sutures superiorly and inferiorly.  The sutures were removed without any difficulty.  Patient tolerated well.  There are no signs of infection on exam.  There is no erythema, swelling or fluid collections palpated.  No significant tenderness to palpation.  Recommended that patient apply Vaseline to her incisions daily for the next 2 to 3 weeks.  Recommended she gently massage the area of firmness to  her left arm.  Discussed with her that after about 2 to 3 weeks, she may start applying scar creams to the incisions.  Discussed with her to avoid direct sunlight as this can worsen the scar.  Patient expressed understanding.  Patient to follow-up as needed.  Instructed her to call if she has any further questions or concerns about anything.  Pictures were obtained of the patient and placed in the chart with the patient's or guardian's permission.

## 2024-04-08 ENCOUNTER — Encounter: Admitting: Student

## 2024-04-14 ENCOUNTER — Ambulatory Visit (INDEPENDENT_AMBULATORY_CARE_PROVIDER_SITE_OTHER): Admitting: Pulmonary Disease

## 2024-04-14 VITALS — BP 142/84 | HR 76 | Temp 98.9°F | Wt 255.0 lb

## 2024-04-14 DIAGNOSIS — I10 Essential (primary) hypertension: Secondary | ICD-10-CM

## 2024-04-14 DIAGNOSIS — R918 Other nonspecific abnormal finding of lung field: Secondary | ICD-10-CM

## 2024-04-14 DIAGNOSIS — E785 Hyperlipidemia, unspecified: Secondary | ICD-10-CM

## 2024-04-14 DIAGNOSIS — R599 Enlarged lymph nodes, unspecified: Secondary | ICD-10-CM

## 2024-04-14 DIAGNOSIS — Z87891 Personal history of nicotine dependence: Secondary | ICD-10-CM | POA: Diagnosis not present

## 2024-04-14 NOTE — Patient Instructions (Signed)
--  ORDER pulmonary function test. Will call with results.  --ORDER CT Chest without contrast in 02/2025

## 2024-04-14 NOTE — Progress Notes (Unsigned)
 Subjective:   PATIENT ID: Tricia [name] GENDER: female DOB: Nov 16, 1960, MRN: [id number]  No chief complaint on file.   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 Quit >30 years Never smoker Never vape  I have personally reviewed patient's past medical/family/social history, allergies, current medications.  Past Medical History:  Diagnosis Date   Allergy    seasonal allergies   Cataract    not a surgical candidate at this time (06/04/2020)   Colon polyps 2006, 2011   TUBULAR ADENOMA AND HYPERPLASTIC POLYP   Esophageal stricture 2012   GERD (gastroesophageal reflux disease)    with certain foods   Glucosuria    with normal blood sugar   Hepatitis C aby neg viral copies    serology positive but neg viral copies    HLD (hyperlipidemia)    diet controlled   Jaundice due to hepatitis    hx when younger unknown cause resolved    Meningioma (HCC)    Migraine headache    some aura  period related  imitrex  helps   MVP (mitral valve prolapse)    echo 02 normal   Seizures (HCC)    last noted seizure 1998-current on med to control   Ulcer 2012     Family History  Problem Relation Age of Onset   Colon cancer Mother 36   Colon polyps Mother 31   Lung cancer Mother    Leukemia Mother    Cancer Mother        gynecological cancer   Colon cancer Father 42   Hyperlipidemia Father    Coronary artery disease Father 93       MI   Diabetes Father        elderly   Colon polyps Father    Heart attack Father 69   Crohn's disease Daughter    Coronary artery disease Maternal Uncle 75   Colon  cancer Paternal Aunt 83       and again at age 77   Colon polyps Paternal Aunt 51   Colon cancer Paternal Uncle 11   Colon polyps Paternal Uncle 65   Lung cancer Maternal Grandmother    Colon cancer Paternal Grandmother 17   Colon polyps Paternal Grandmother 68   Deep vein thrombosis Paternal Grandfather    Other Brother        transient global amnesia   Colon polyps Brother 71   Mitral valve prolapse Brother    Breast cancer Neg Hx    Stomach cancer Neg Hx    Rectal cancer Neg Hx    Esophageal cancer Neg Hx    BRCA 1/2 Neg Hx      Social History   Occupational History   Occupation: Secretary/administrator: [employer]  Tobacco Use   Smoking status: Former    Current packs/day: 0.00    Types: Cigarettes    Quit date: 05/22/1973    Years since quitting: 50.9   Smokeless tobacco: Never  Vaping Use   Vaping status: Never Used  Substance and Sexual Activity   Alcohol use: Yes    Comment: socially  Drug use: No   Sexual activity: Not on file    Allergies  Allergen Reactions   Codeine Sulfate     REACTION: unspecified   Phenytoin Sodium Extended Rash     Outpatient Medications Prior to Visit  Medication Sig Dispense Refill   B Complex-Folic Acid (B COMPLEX MAXI PO) Take 1 tablet by mouth daily in the afternoon.     Calcium Carbonate-Vit D-Min (CALCIUM 1200 PO) Take 1 capsule by mouth daily in the afternoon.     Multiple Vitamins-Minerals (CENTRUM SILVER 50+WOMEN) TABS Take 1 tablet by mouth daily.     Omega-3 Fatty Acids (FISH OIL) 1000 MG CAPS Take 1 capsule by mouth daily in the afternoon.     pantoprazole  (PROTONIX ) 40 MG tablet TAKE 1 TABLET BY MOUTH EVERY DAY 90 tablet 1   PHENobarbital  (LUMINAL) 97.2 MG tablet Take 97.2 mg by mouth at bedtime.     SUMAtriptan  (IMITREX ) 100 MG tablet Take 1 tablet (100 mg total) by mouth once for 1 dose. May repeat in 2 hours if headache persists or recurs. 10 tablet 2   Vitamin D , Cholecalciferol , 25 MCG (1000 UT) TABS Take 1  tablet by mouth daily.     ZEPBOUND  7.5 MG/0.5ML injection vial INJECT 0.5 ML (7.5 MG) UNDER THE SKIN ONCE WEEKLY (0.5ML= 50 UNITS) 2 mL 1   cyanocobalamin  (VITAMIN B12) 1000 MCG tablet Take 1,000 mcg by mouth daily. (Patient not taking: Reported on 04/14/2024)     traZODone  (DESYREL ) 50 MG tablet TAKE 1/2 TO 1 TABLET BY MOUTH AT BEDTIME AS NEEDED FOR SLEEP (Patient not taking: Reported on 04/14/2024) 90 tablet 1   No facility-administered medications prior to visit.    ROS   Objective:   Vitals:   04/14/24 1443  BP: (!) 142/84  Pulse: 76  Temp: 98.9 F (37.2 C)  SpO2: 98%  Weight: 255 lb (115.7 kg)   SpO2: 98 %  Physical Exam: General: Well-appearing, no acute distress HENT: [place], AT Eyes: EOMI, no scleral icterus Respiratory: ***Clear to auscultation bilaterally.  No crackles, wheezing or rales Cardiovascular: RRR, -M/R/G, no JVD Extremities:-Edema,-tenderness Neuro: AAO x4, CNII-XII grossly intact Psych: Normal mood, normal affect  Data Reviewed:  Imaging: CT Cardiac 02/21/24 -  Mild subsegmental scarring within the right middle lobe. Benign calcified granuloma in the left upper lobe. No pleural fluid. The included airways are patent.  PFT: None of file  Labs: CBC    Component Value Date/Time   WBC 7.2 02/12/2024 1006   RBC 5.10 02/12/2024 1006   HGB 14.3 02/12/2024 1006   HCT 44.1 02/12/2024 1006   PLT 288.0 02/12/2024 1006   MCV 86.4 02/12/2024 1006   MCH 28.6 11/10/2023 1111   MCHC 32.4 02/12/2024 1006   RDW 14.6 02/12/2024 1006   LYMPHSABS 1.2 02/12/2024 1006   MONOABS 0.4 02/12/2024 1006   EOSABS 0.2 02/12/2024 1006   BASOSABS 0.1 02/12/2024 1006   Normal blood counts     Latest Ref Rng & Units 02/12/2024   10:06 AM 11/10/2023    1:35 PM 11/10/2023   11:11 AM  CMP  Glucose 70 - 99 mg/dL 85   892   BUN 6 - 23 mg/dL 15   9   Creatinine 9.59 - 1.20 mg/dL 9.10   9.10   Sodium 864 - 145 mEq/L 139   139   Potassium 3.5 - 5.1 mEq/L 4.8   4.4    Chloride 96 - 112 mEq/L 103   102  CO2 19 - 32 mEq/L 29   22   Calcium 8.4 - 10.5 mg/dL 9.7   89.9   Total Protein 6.0 - 8.3 g/dL 7.7  7.2    Total Bilirubin 0.2 - 1.2 mg/dL 0.2  0.3    Alkaline Phos 39 - 117 U/L 89  99    AST 0 - 37 U/L 20  24    ALT 0 - 35 U/L 21  48     Normal liver and kidney function     Assessment & Plan:   Discussion: HPI  CT Cardiac reviewed and showed calcified lymph nodes in left hilar. Likely sequelae of granulomatous disease. We discussed possible sarcoid and clinical course in the lungs however I have low suspicion that this is active disease.  --ORDER pulmonary function test. Will call with results.  --ORDER CT Chest without contrast in 02/2025  Health Maintenance Immunization History  Administered Date(s) Administered   INFLUENZA, HIGH DOSE SEASONAL PF 02/12/2024   Influenza Split 02/08/2011, 03/11/2012, 03/07/2013   Influenza, Seasonal, Injecte, Preservative Fre 02/07/2023   Influenza,inj,Quad PF,6+ Mos 03/31/2015, 04/12/2016, 03/14/2019, 02/12/2020, 01/31/2021, 02/02/2022   Influenza-Unspecified 03/14/2018   PFIZER(Purple Top)SARS-COV-2 Vaccination 08/14/2019, 09/08/2019, 04/28/2020   PNEUMOCOCCAL CONJUGATE-20 02/12/2024   Td 08/21/1995   Tdap 02/08/2011, 02/02/2022   Zoster Recombinant(Shingrix) 11/14/2018, 05/21/2019   CT Lung Screen - not qualified  No orders of the defined types were placed in this encounter. No orders of the defined types were placed in this encounter.   No follow-ups on file.  I have spent a total time of***-minutes on the day of the appointment reviewing prior documentation, coordinating care and discussing medical diagnosis and plan with the patient/family. Imaging, labs and tests included in this note have been reviewed and interpreted independently by me.  Demarcus Thielke Slater Staff, MD [facility] 04/14/2024 3:05 PM

## 2024-04-16 ENCOUNTER — Encounter (HOSPITAL_BASED_OUTPATIENT_CLINIC_OR_DEPARTMENT_OTHER): Payer: Self-pay | Admitting: Pulmonary Disease

## 2024-04-16 ENCOUNTER — Ambulatory Visit (HOSPITAL_BASED_OUTPATIENT_CLINIC_OR_DEPARTMENT_OTHER)

## 2024-04-16 ENCOUNTER — Ambulatory Visit (HOSPITAL_BASED_OUTPATIENT_CLINIC_OR_DEPARTMENT_OTHER): Payer: Self-pay | Admitting: Pulmonary Disease

## 2024-04-16 DIAGNOSIS — R599 Enlarged lymph nodes, unspecified: Secondary | ICD-10-CM

## 2024-04-16 LAB — PULMONARY FUNCTION TEST
DL/VA % pred: 113 %
DL/VA: 4.71 ml/min/mmHg/L
DLCO unc % pred: 114 %
DLCO unc: 23.57 ml/min/mmHg
FEF 25-75 Post: 2.11 L/s
FEF 25-75 Pre: 1.74 L/s
FEF2575-%Change-Post: 20 %
FEF2575-%Pred-Post: 96 %
FEF2575-%Pred-Pre: 79 %
FEV1-%Change-Post: 5 %
FEV1-%Pred-Post: 102 %
FEV1-%Pred-Pre: 97 %
FEV1-Post: 2.57 L
FEV1-Pre: 2.44 L
FEV1FVC-%Change-Post: 4 %
FEV1FVC-%Pred-Pre: 93 %
FEV6-%Change-Post: 1 %
FEV6-%Pred-Post: 108 %
FEV6-%Pred-Pre: 107 %
FEV6-Post: 3.42 L
FEV6-Pre: 3.38 L
FEV6FVC-%Change-Post: 0 %
FEV6FVC-%Pred-Post: 103 %
FEV6FVC-%Pred-Pre: 103 %
FVC-%Change-Post: 1 %
FVC-%Pred-Post: 104 %
FVC-%Pred-Pre: 103 %
FVC-Post: 3.44 L
FVC-Pre: 3.39 L
Post FEV1/FVC ratio: 75 %
Post FEV6/FVC ratio: 100 %
Pre FEV1/FVC ratio: 72 %
Pre FEV6/FVC Ratio: 100 %
RV % pred: 147 %
RV: 3.16 L
TLC % pred: 123 %
TLC: 6.41 L

## 2024-04-16 NOTE — Patient Instructions (Signed)
 Full PFT performed today.

## 2024-04-16 NOTE — Progress Notes (Signed)
 Full PFT performed today.

## 2024-04-16 NOTE — Telephone Encounter (Signed)
 Left voicemail regarding normal pulmonary function test results

## 2024-04-23 ENCOUNTER — Telehealth: Payer: Self-pay

## 2024-04-23 NOTE — Telephone Encounter (Signed)
 Copied from CRM #[id number]. Topic: Referral - Question >> Apr 22, 2024  1:10 PM Tricia [name] wrote: Reason for CRM: Pt is calling today because she is having foot surgery on 06/10/24 with Dr Norleen Armor and received a referral order to have PT ([facility]?) and wanted to see what her next steps were. Please call pt to advise

## 2024-04-24 NOTE — Telephone Encounter (Signed)
 Spoke with pt about this and she understood and will go to there office to to the PT.

## 2024-05-08 ENCOUNTER — Other Ambulatory Visit: Payer: Self-pay | Admitting: Physician Assistant

## 2024-05-26 ENCOUNTER — Encounter: Payer: Self-pay | Admitting: Physician Assistant

## 2024-05-26 ENCOUNTER — Other Ambulatory Visit: Payer: Self-pay | Admitting: Physician Assistant

## 2024-05-26 MED ORDER — ALENDRONATE SODIUM 70 MG PO TABS: 70.0000 mg | ORAL_TABLET | ORAL | 11 refills | Status: AC

## 2024-06-05 ENCOUNTER — Encounter: Payer: Self-pay | Admitting: Physician Assistant

## 2024-06-18 ENCOUNTER — Ambulatory Visit

## 2024-06-18 VITALS — Ht 65.0 in | Wt 243.0 lb

## 2024-06-18 DIAGNOSIS — Z0000 Encounter for general adult medical examination without abnormal findings: Secondary | ICD-10-CM

## 2024-06-18 NOTE — Progress Notes (Signed)
 "  Chief Complaint  Patient presents with   Medicare Wellness     Subjective:   Tricia [name] is a 64 y.o. female who presents for a Medicare Annual Wellness Visit.  Visit info / Clinical Intake: Medicare Wellness Visit Type:: Initial Annual Wellness Visit Persons participating in visit and providing information:: patient Medicare Wellness Visit Mode:: Telephone If telephone:: video declined Since this visit was completed virtually, some vitals may be partially provided or unavailable. Missing vitals are due to the limitations of the virtual format.: Unable to obtain vitals - no equipment If Telephone or Video please confirm:: I connected with patient using audio/video enable telemedicine. I verified patient identity with two identifiers, discussed telehealth limitations, and patient agreed to proceed. Patient Location:: home Provider Location:: office Interpreter Needed?: No Pre-visit prep was completed: yes AWV questionnaire completed by patient prior to visit?: no Living arrangements:: (!) lives alone Patient's Overall Health Status Rating: very good Typical amount of pain: none Does pain affect daily life?: no Are you currently prescribed opioids?: no  Dietary Habits and Nutritional Risks How many meals a day?: (!) 1 (with snacks) Eats fruit and vegetables daily?: (!) no Most meals are obtained by: preparing own meals In the last 2 weeks, have you had any of the following?: none Diabetic:: no  Functional Status Activities of Daily Living (to include ambulation/medication): Independent Ambulation: Independent with device- listed below Home Assistive Devices/Equipment: Eyeglasses; Other (Comment) (scooter with foot surgery) Medication Administration: Independent Home Management (perform basic housework or laundry): Independent Manage your own finances?: yes Primary transportation is: driving Concerns about vision?: no *vision screening is required for WTM* Concerns  about hearing?: no  Fall Screening Falls in the past year?: 0 Number of falls in past year: 0 Was there an injury with Fall?: 0 Fall Risk Category Calculator: 0 Patient Fall Risk Level: Low Fall Risk  Fall Risk Patient at Risk for Falls Due to: Impaired mobility (foot surgery) Fall risk Follow up: Falls evaluation completed  Home and Transportation Safety: All rugs have non-skid backing?: N/A, no rugs All stairs or steps have railings?: N/A, no stairs Grab bars in the bathtub or shower?: yes Have non-skid surface in bathtub or shower?: yes Good home lighting?: yes Regular seat belt use?: yes Hospital stays in the last year:: no  Cognitive Assessment Difficulty concentrating, remembering, or making decisions? : no Will 6CIT or Mini Cog be Completed: yes What year is it?: 0 points What month is it?: 0 points Give patient an address phrase to remember (5 components): [address] [place]  About what time is it?: 0 points Count backwards from 20 to 1: 0 points Say the months of the year in reverse: 0 points Repeat the address phrase from earlier: 0 points 6 CIT Score: 0 points  Advance Directives (For Healthcare) Does Patient Have a Medical Advance Directive?: Yes Type of Advance Directive: Healthcare Power of Attorney Copy of Healthcare Power of Attorney in Chart?: No - copy requested Would patient like information on creating a medical advance directive?: No - Patient declined  Reviewed/Updated  Reviewed/Updated: Reviewed All (Medical, Surgical, Family, Medications, Allergies, Care Teams, Patient Goals)    Allergies (verified) Codeine sulfate and Phenytoin sodium extended   Current Medications (verified) Outpatient Encounter Medications as of 06/18/2024  Medication Sig   B Complex-Folic Acid (B COMPLEX MAXI PO) Take 1 tablet by mouth daily in the afternoon.   Calcium Carbonate-Vit D-Min (CALCIUM 1200 PO) Take 1 capsule by mouth daily in the  afternoon.   Multiple  Vitamins-Minerals (CENTRUM SILVER 50+WOMEN) TABS Take 1 tablet by mouth daily.   Omega-3 Fatty Acids (FISH OIL) 1000 MG CAPS Take 1 capsule by mouth daily in the afternoon.   pantoprazole  (PROTONIX ) 40 MG tablet TAKE 1 TABLET BY MOUTH EVERY DAY   PHENobarbital  (LUMINAL) 97.2 MG tablet Take 97.2 mg by mouth at bedtime.   SUMAtriptan  (IMITREX ) 100 MG tablet Take 1 tablet (100 mg total) by mouth once for 1 dose. May repeat in 2 hours if headache persists or recurs.   Vitamin D , Cholecalciferol , 25 MCG (1000 UT) TABS Take 1 tablet by mouth daily.   XARELTO 10 MG TABS tablet Take 10 mg by mouth daily.   alendronate  (FOSAMAX ) 70 MG tablet Take 1 tablet (70 mg total) by mouth every 7 (seven) days. Take with a full glass of water on an empty stomach. (Patient not taking: Reported on 06/18/2024)   traZODone  (DESYREL ) 50 MG tablet TAKE 1/2 TO 1 TABLET BY MOUTH AT BEDTIME AS NEEDED FOR SLEEP (Patient not taking: Reported on 04/14/2024)   ZEPBOUND  7.5 MG/0.5ML injection vial INJECT 0.5 ML (7.5 MG) UNDER THE SKIN ONCE WEEKLY (0.5ML= 50 UNITS) (Patient not taking: Reported on 06/18/2024)   [DISCONTINUED] cyanocobalamin  (VITAMIN B12) 1000 MCG tablet Take 1,000 mcg by mouth daily. (Patient not taking: Reported on 04/14/2024)   No facility-administered encounter medications on file as of 06/18/2024.    History: Past Medical History:  Diagnosis Date   Allergy    seasonal allergies   Cataract    not a surgical candidate at this time (06/04/2020)   Colon polyps 2006, 2011   TUBULAR ADENOMA AND HYPERPLASTIC POLYP   Esophageal stricture 2012   GERD (gastroesophageal reflux disease)    with certain foods   Glucosuria    with normal blood sugar   Hepatitis C aby neg viral copies    serology positive but neg viral copies    HLD (hyperlipidemia)    diet controlled   Jaundice due to hepatitis    hx when younger unknown cause resolved    Meningioma (HCC)    Migraine headache    some aura  period related   imitrex  helps   MVP (mitral valve prolapse)    echo 02 normal   Seizures (HCC)    last noted seizure 1998-current on med to control   Ulcer 2012   Past Surgical History:  Procedure Laterality Date   APPENDECTOMY     BREAST BIOPSY Right 11/24/2022   MM RT BREAST BX W LOC DEV 1ST LESION IMAGE BX SPEC STEREO GUIDE 11/24/2022 [facility]   BUNIONECTOMY     laft   CHOLECYSTECTOMY  2015   Dr Sheldon   CIN D&C conization  2000   COLONOSCOPY  2016   KN-MAC-prep good (Suprep), HPP polypoid   COLONOSCOPY W/ BIOPSIES  2011   SEVERAL    CRANIOTOMY     CYSTECTOMY  1982   ESOPHAGOGASTRODUODENOSCOPY  2012   EXCISION MASS UPPER EXTREMETIES Left 03/06/2024   Procedure: EXCISION MASS UPPER EXTREMITIES;  Surgeon: Lowery Estefana RAMAN, DO;  Location: [facility];  Service: Plastics;  Laterality: Left;   EXCISION MASS, BACK Right 03/06/2024   Procedure: EXCISION MASS, BACK;  Surgeon: Lowery Estefana RAMAN, DO;  Location: [facility];  Service: Plastics;  Laterality: Right;  Excision of back mass, left arm   frontal meningioma resected  1998   left shoulder dislocation surgery  2005   POLYPECTOMY  2016  HPP polypoid   TUBAL LIGATION     WISDOM TOOTH EXTRACTION     WRIST SURGERY  04/21/2017   rt-cyst removal    Family History  Problem Relation Age of Onset   Colon cancer Mother 38   Colon polyps Mother 16   Lung cancer Mother    Leukemia Mother    Cancer Mother        gynecological cancer   Colon cancer Father 5   Hyperlipidemia Father    Coronary artery disease Father 35       MI   Diabetes Father        elderly   Colon polyps Father    Heart attack Father 48   Crohn's disease Daughter    Coronary artery disease Maternal Uncle 16   Colon cancer Paternal Aunt 7       and again at age 51   Colon polyps Paternal Aunt 53   Colon cancer Paternal Uncle 43   Colon polyps Paternal Uncle 40   Lung cancer Maternal Grandmother    Colon cancer Paternal  Grandmother 74   Colon polyps Paternal Grandmother 17   Deep vein thrombosis Paternal Grandfather    Other Brother        transient global amnesia   Colon polyps Brother 102   Mitral valve prolapse Brother    Breast cancer Neg Hx    Stomach cancer Neg Hx    Rectal cancer Neg Hx    Esophageal cancer Neg Hx    BRCA 1/2 Neg Hx    Social History   Occupational History   Occupation: Secretary/administrator: [employer]  Tobacco Use   Smoking status: Former    Current packs/day: 0.00    Types: Cigarettes    Quit date: 05/22/1973    Years since quitting: 51.1   Smokeless tobacco: Never  Vaping Use   Vaping status: Never Used  Substance and Sexual Activity   Alcohol use: Yes    Comment: socially   Drug use: No   Sexual activity: Not on file   Tobacco Counseling Counseling given: Not Answered  SDOH Screenings   Food Insecurity: No Food Insecurity (06/18/2024)  Housing: Low Risk (06/18/2024)  Transportation Needs: No Transportation Needs (06/18/2024)  Utilities: Not At Risk (06/18/2024)  Depression (PHQ2-9): Low Risk (06/18/2024)  Physical Activity: Sufficiently Active (06/18/2024)  Social Connections: Moderately Isolated (06/18/2024)  Stress: No Stress Concern Present (06/18/2024)  Tobacco Use: Medium Risk (06/18/2024)  Health Literacy: Adequate Health Literacy (06/18/2024)   See flowsheets for full screening details  Depression Screen PHQ 2 & 9 Depression Scale- Over the past 2 weeks, how often have you been bothered by any of the following problems? Little interest or pleasure in doing things: 0 Feeling down, depressed, or hopeless (PHQ Adolescent also includes...irritable): 0 PHQ-2 Total Score: 0     Goals Addressed               This Visit's Progress     get back to walking and lose weight (pt-stated)               Objective:    Today's Vitals   06/18/24 1408  Weight: 243 lb (110.2 kg)  Height: 5' 5 (1.651 m)   Body mass index is 40.44  kg/m.  Hearing/Vision screen No results found. Immunizations and Health Maintenance Health Maintenance  Topic Date Due   Mammogram  11/21/2024   Medicare Annual Wellness (AWV)  06/18/2025   Colonoscopy  08/11/2025   Bone Density Scan  02/06/2026   Cervical Cancer Screening (HPV/Pap Cotest)  02/10/2026   DTaP/Tdap/Td (4 - Td or Tdap) 02/03/2032   Pneumococcal Vaccine: 50+ Years  Completed   Influenza Vaccine  Completed   Hepatitis C Screening  Completed   HIV Screening  Completed   Zoster Vaccines- Shingrix  Completed   Hepatitis B Vaccines 19-59 Average Risk  Aged Out   Meningococcal B Vaccine  Aged Out   COVID-19 Vaccine  Discontinued        Assessment/Plan:  This is a routine wellness examination for Tricia [name].  Patient Care Team: Job Lukes, [place] as PCP - General (Physician Assistant) Debrah Lamar BIRCH, MD (Inactive) (Gastroenterology) Dwane Lerner, MD as Attending Physician (Dermatology) Arloa Nathanel HERO, NP (Inactive) as Nurse Practitioner (Obstetrics and Gynecology) Onetha Kuba, MD as Consulting Physician (Neurosurgery) Shila Gustav GAILS, MD as Consulting Physician (Gastroenterology) Kristie Staggers, FNP (Obstetrics and Gynecology) Camillo Golas, MD as Attending Physician (Ophthalmology)  I have personally reviewed and noted the following in the patients chart:   Medical and social history Use of alcohol, tobacco or illicit drugs  Current medications and supplements including opioid prescriptions. Functional ability and status Nutritional status Physical activity Advanced directives List of other physicians Hospitalizations, surgeries, and ER visits in previous 12 months Vitals Screenings to include cognitive, depression, and falls Referrals and appointments  No orders of the defined types were placed in this encounter.  In addition, I have reviewed and discussed with patient certain preventive protocols, quality metrics, and best practice  recommendations. A written personalized care plan for preventive services as well as general preventive health recommendations were provided to patient.   Tricia VEAR Haws, Tricia [name]   [id number]   Return in about 1 year (around 06/22/2025).  After Visit Summary: (MyChart) Due to this being a telephonic visit, the after visit summary with patients personalized plan was offered to patient via MyChart   Nurse Notes: No voiced or noted concerns at this time "

## 2024-06-18 NOTE — Patient Instructions (Signed)
 Tricia [name],  Thank you for taking the time for your Medicare Wellness Visit. I appreciate your continued commitment to your health goals. Please review the care plan we discussed, and feel free to reach out if I can assist you further.  Please note that Annual Wellness Visits do not include a physical exam. Some assessments may be limited, especially if the visit was conducted virtually. If needed, we may recommend an in-person follow-up with your provider.  Ongoing Care Seeing your primary care provider every 3 to 6 months helps us  monitor your health and provide consistent, personalized care.   Referrals If a referral was made during today's visit and you haven't received any updates within two weeks, please contact the referred provider directly to check on the status.  Recommended Screenings:  Health Maintenance  Topic Date Due   Breast Cancer Screening  11/21/2024   Medicare Annual Wellness Visit  06/18/2025   Colon Cancer Screening  08/11/2025   Osteoporosis screening with Bone Density Scan  02/06/2026   Pap with HPV screening  02/10/2026   DTaP/Tdap/Td vaccine (4 - Td or Tdap) 02/03/2032   Pneumococcal Vaccine for age over 28  Completed   Flu Shot  Completed   Hepatitis C Screening  Completed   HIV Screening  Completed   Zoster (Shingles) Vaccine  Completed   Hepatitis B Vaccine  Aged Out   Meningitis B Vaccine  Aged Out   COVID-19 Vaccine  Discontinued       06/18/2024    2:13 PM  Advanced Directives  Does Patient Have a Medical Advance Directive? Yes  Type of Advance Directive Healthcare Power of Attorney  Copy of Healthcare Power of Attorney in Chart? No - copy requested    Vision: Annual vision screenings are recommended for early detection of glaucoma, cataracts, and diabetic retinopathy. These exams can also reveal signs of chronic conditions such as diabetes and high blood pressure.  Dental: Annual dental screenings help detect early signs of oral cancer, gum  disease, and other conditions linked to overall health, including heart disease and diabetes.  Please see the attached documents for additional preventive care recommendations.

## 2025-02-17 ENCOUNTER — Encounter: Admitting: Physician Assistant

## 2025-06-22 ENCOUNTER — Ambulatory Visit
